# Patient Record
Sex: Female | Born: 1935
Health system: Southern US, Community
[De-identification: ages and names within clinical notes are randomized; demographics above are authoritative.]

## PROBLEM LIST (undated history)

## (undated) DIAGNOSIS — H269 Unspecified cataract: Secondary | ICD-10-CM

## (undated) DIAGNOSIS — T8859XA Other complications of anesthesia, initial encounter: Secondary | ICD-10-CM

## (undated) DIAGNOSIS — F32A Depression, unspecified: Secondary | ICD-10-CM

## (undated) DIAGNOSIS — M810 Age-related osteoporosis without current pathological fracture: Secondary | ICD-10-CM

## (undated) DIAGNOSIS — I10 Essential (primary) hypertension: Secondary | ICD-10-CM

## (undated) DIAGNOSIS — M1990 Unspecified osteoarthritis, unspecified site: Secondary | ICD-10-CM

## (undated) DIAGNOSIS — T4145XA Adverse effect of unspecified anesthetic, initial encounter: Secondary | ICD-10-CM

## (undated) DIAGNOSIS — K56609 Unspecified intestinal obstruction, unspecified as to partial versus complete obstruction: Secondary | ICD-10-CM

## (undated) DIAGNOSIS — F419 Anxiety disorder, unspecified: Secondary | ICD-10-CM

## (undated) DIAGNOSIS — C44219 Basal cell carcinoma of skin of left ear and external auricular canal: Secondary | ICD-10-CM

## (undated) DIAGNOSIS — F329 Major depressive disorder, single episode, unspecified: Secondary | ICD-10-CM

## (undated) HISTORY — DX: Age-related osteoporosis without current pathological fracture: M81.0

## (undated) HISTORY — PX: APPENDECTOMY: SHX54

## (undated) HISTORY — DX: Unspecified cataract: H26.9

## (undated) HISTORY — DX: Unspecified osteoarthritis, unspecified site: M19.90

## (undated) HISTORY — PX: ABDOMINAL HYSTERECTOMY: SHX81

## (undated) HISTORY — DX: Depression, unspecified: F32.A

## (undated) HISTORY — DX: Unspecified intestinal obstruction, unspecified as to partial versus complete obstruction: K56.609

## (undated) HISTORY — PX: COLON SURGERY: SHX602

## (undated) HISTORY — DX: Anxiety disorder, unspecified: F41.9

## (undated) HISTORY — DX: Basal cell carcinoma of skin of left ear and external auricular canal: C44.219

## (undated) HISTORY — DX: Major depressive disorder, single episode, unspecified: F32.9

---

## 1997-12-11 ENCOUNTER — Ambulatory Visit (HOSPITAL_COMMUNITY): Admission: RE | Admit: 1997-12-11 | Discharge: 1997-12-11 | Payer: Self-pay | Admitting: Obstetrics and Gynecology

## 1997-12-11 ENCOUNTER — Encounter: Payer: Self-pay | Admitting: Internal Medicine

## 1999-11-30 ENCOUNTER — Ambulatory Visit (HOSPITAL_COMMUNITY): Admission: RE | Admit: 1999-11-30 | Discharge: 1999-11-30 | Payer: Self-pay | Admitting: *Deleted

## 2000-12-05 ENCOUNTER — Inpatient Hospital Stay (HOSPITAL_COMMUNITY): Admission: EM | Admit: 2000-12-05 | Discharge: 2000-12-06 | Payer: Self-pay | Admitting: Emergency Medicine

## 2000-12-05 ENCOUNTER — Encounter: Payer: Self-pay | Admitting: Emergency Medicine

## 2001-05-19 ENCOUNTER — Inpatient Hospital Stay (HOSPITAL_COMMUNITY): Admission: EM | Admit: 2001-05-19 | Discharge: 2001-05-23 | Payer: Self-pay | Admitting: Emergency Medicine

## 2001-05-19 ENCOUNTER — Encounter: Payer: Self-pay | Admitting: Surgery

## 2001-05-19 ENCOUNTER — Encounter: Payer: Self-pay | Admitting: Emergency Medicine

## 2001-07-03 ENCOUNTER — Encounter: Payer: Self-pay | Admitting: Surgery

## 2001-07-03 ENCOUNTER — Encounter: Admission: RE | Admit: 2001-07-03 | Discharge: 2001-07-03 | Payer: Self-pay | Admitting: Surgery

## 2002-02-28 ENCOUNTER — Encounter (INDEPENDENT_AMBULATORY_CARE_PROVIDER_SITE_OTHER): Payer: Self-pay | Admitting: *Deleted

## 2002-02-28 ENCOUNTER — Ambulatory Visit (HOSPITAL_COMMUNITY): Admission: RE | Admit: 2002-02-28 | Discharge: 2002-02-28 | Payer: Self-pay | Admitting: *Deleted

## 2002-03-24 ENCOUNTER — Encounter (INDEPENDENT_AMBULATORY_CARE_PROVIDER_SITE_OTHER): Payer: Self-pay

## 2002-03-24 ENCOUNTER — Encounter: Payer: Self-pay | Admitting: Emergency Medicine

## 2002-03-24 ENCOUNTER — Encounter: Payer: Self-pay | Admitting: General Surgery

## 2002-03-24 ENCOUNTER — Inpatient Hospital Stay (HOSPITAL_COMMUNITY): Admission: EM | Admit: 2002-03-24 | Discharge: 2002-03-30 | Payer: Self-pay | Admitting: Emergency Medicine

## 2002-11-21 ENCOUNTER — Ambulatory Visit (HOSPITAL_COMMUNITY): Admission: RE | Admit: 2002-11-21 | Discharge: 2002-11-21 | Payer: Self-pay | Admitting: Internal Medicine

## 2004-05-21 ENCOUNTER — Ambulatory Visit (HOSPITAL_COMMUNITY): Admission: RE | Admit: 2004-05-21 | Discharge: 2004-05-21 | Payer: Self-pay | Admitting: Internal Medicine

## 2005-11-16 ENCOUNTER — Ambulatory Visit (HOSPITAL_COMMUNITY): Admission: RE | Admit: 2005-11-16 | Discharge: 2005-11-16 | Payer: Self-pay | Admitting: Internal Medicine

## 2006-06-09 ENCOUNTER — Encounter: Admission: RE | Admit: 2006-06-09 | Discharge: 2006-06-09 | Payer: Self-pay | Admitting: Internal Medicine

## 2006-06-21 ENCOUNTER — Encounter: Admission: RE | Admit: 2006-06-21 | Discharge: 2006-06-21 | Payer: Self-pay | Admitting: Internal Medicine

## 2006-06-23 ENCOUNTER — Encounter: Payer: Self-pay | Admitting: Internal Medicine

## 2006-07-31 ENCOUNTER — Ambulatory Visit (HOSPITAL_COMMUNITY): Admission: RE | Admit: 2006-07-31 | Discharge: 2006-07-31 | Payer: Self-pay | Admitting: Interventional Radiology

## 2006-07-31 ENCOUNTER — Encounter (INDEPENDENT_AMBULATORY_CARE_PROVIDER_SITE_OTHER): Payer: Self-pay | Admitting: Interventional Radiology

## 2006-08-30 ENCOUNTER — Encounter: Payer: Self-pay | Admitting: Interventional Radiology

## 2009-10-06 ENCOUNTER — Encounter: Admission: RE | Admit: 2009-10-06 | Discharge: 2009-10-06 | Payer: Self-pay | Admitting: Internal Medicine

## 2010-02-13 ENCOUNTER — Encounter
Admission: RE | Admit: 2010-02-13 | Discharge: 2010-02-13 | Payer: Self-pay | Source: Home / Self Care | Attending: Internal Medicine | Admitting: Internal Medicine

## 2010-02-14 ENCOUNTER — Encounter: Payer: Self-pay | Admitting: Internal Medicine

## 2010-06-08 NOTE — Consult Note (Signed)
NAMEANTOINE, FIALLOS NO.:  [id number]   MEDICAL RECORD NO.:  [id number]          PATIENT TYPE:  OUT   LOCATION:  XRAY                         FACILITY:  [facility]   PHYSICIAN:  Sanjeev K. Deveshwar, M.D.DATE OF BIRTH:  04-07-1931   DATE OF CONSULTATION:  06/23/2006  DATE OF DISCHARGE:  06/23/2006                                 CONSULTATION   CHIEF COMPLAINT:  Back pain.   HISTORY OF PRESENT ILLNESS:  This is a very pleasant 77 year old female  who was referred to Dr. Corliss Skains through the courtesy of Dr. Ricki Miller.  The  patient developed back pain approximately 3 months ago.  She had an MRI  of the spine on Jun 09, 2006, that showed acute or subacute compression  fractures of the superior end plates of T7 and T11.  There is also a  lesion at T10 that was possibly a benign atypical hemangioma; however,  this could not be certain.  A bone scan was performed on Jun 21, 2006,  that again showed the compression deformities at T7 and T11.  There was  no abnormality noted at T10.  However, Dr. Corliss Skains noted that multiple  myeloma cannot be ruled out.   As noted, the patient has had back pain for approximately 3 months.  She  states her pain is worse while sitting.  This does limit her ability to  participate in her activities of daily living.  She does live alone and  has to do her own housework and self-care.  She presents today to  discuss further treatment options.   PAST MEDICAL HISTORY:  The patient has been very healthy.  She does have  a long history of tobacco use and probable COPD, at least by chest x-ray  on May 25, 2006.  She also has a history of some anxiety.  Otherwise, she  has been very healthy.  She denies any history of diabetes, coronary  artery disease, hypertension or CVAs.   SURGICAL HISTORY:  The patient had a hysterectomy in the past.  She also  had an exploratory laparotomy with a small-bowel resection in February  2004.  She is also status post  appendectomy.  She denies any problems  with anesthesia.   ALLERGIES:  No known drug allergies, although she did not tolerate  FOSAMAX in the past.  She denies allergies to contrast dye, shrimp,  iodine, shellfish or latex.   CURRENT MEDICATIONS:  Include Tylenol p.r.n., Effexor, calcium and  vitamin D.   SOCIAL HISTORY:  The patient is widowed.  She has two children.  She  lives alone in [place].  She quit smoking in 1995.  She did smoke a  pack-and-a-half per day for at least 40 years.  She has a glass of wine  daily.  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  She still  works part-time.   FAMILY HISTORY:  Her father died at age 86 in his sleep.  They felt it  was probably stroke or an MI.   IMPRESSION AND PLAN:  As noted, this pleasant 77 year old female with  osteoporosis has been referred  to Dr. Corliss Skains through the courtesy of  Dr. Ricki Miller for further evaluation of compression fractures and a history  of back pain x3 months.  There is no history of trauma.  Dr. Corliss Skains  reviewed the results of the patient's MRI as well as her bone scan with  the patient.  He pointed out the areas of concern.  He showed the  patient the compression fractures which were felt to be the source of  her back pain.  Treatment options were discussed including continued  limited mobility and pain medication versus kyphoplasty or  vertebroplasty.  The kyphoplasty or vertebroplasty  procedures were described in detail along with the risks and benefits.  The patient would like to proceed with the intervention for relief of  her pain.  We will call her next week to set a date for the  intervention.   Greater than 30 minutes was spent on this consult.      Delton See, P.A.    ______________________________  Grandville Silos. Corliss Skains, M.D.    DR/MEDQ  D:  06/26/2006  T:  06/26/2006  Job:  [id number]   cc:   Juline Patch, M.D.

## 2010-06-11 NOTE — Cardiovascular Report (Signed)
[place]  Patient:    Catherine Young, Catherine Young Visit Number: [id number] MRN: [id number]          Service Type: MED Location: 3W [phone] Attending Physician:  Noland Fordyce Dictated by:   Julieanne Manson, M.D. Proc. Date: 12/06/00 Admit Date:  12/05/2000   CC:         Lilia Pro, M.D.  [facility]   Cardiac Catheterization  INDICATIONS FOR TEST:  [name] is a 77 year old female who was admitted to [facility] on December 05, 2000 with anginal-type pain.  Her cardiac enzymes were negative and her CKs were unremarkable.  She has vascular bruits on exam.  She is brought to [facility] to [facility] for cardiac catheterization.  DESCRIPTION OF PROCEDURE:  Patient was prepped and draped in the usual sterile fashion, exposing the right groin.  Applying local anesthetic with 1% Xylocaine, the Seldinger technique was employed and a 5-French introducer sheath was placed in the right femoral artery.  Selective left and right coronary arteriography and ventriculography were performed.  EQUIPMENT:  5-French Judkins configuration catheters, however, a 5-French internal mammary artery catheter was used to cannulate the right coronary artery.  COMPLICATIONS:  None.  RESULTS: 1. Hemodynamic monitoring:  Central aortic pressure was 133/54.  Left    ventricular pressure was 138/15 with no significant aortic valve gradient    noted at the time of pullback. 2. Ventriculography:  Ventriculography in the RAO projection revealed normal    left ventricular systolic function.  The end-diastolic pressure was 18.    The ejection fraction was 54%. 3. Coronary arteriography:  On fluoroscopy, there was dense calcification in    the proximal LAD and right coronary artery.    a. Left main:  Normal.    b. LAD:  The LAD went down to the apex of the heart and had mild, less than       50% areas of narrowing in the proximal and mid-segment.  The  diagonal       vessel was small.    c. Circumflex:  Circumflex gave rise to two OMs; first OM was large and       free of disease; OM #2 was small and free of disease.    d. Right coronary artery:  The right coronary artery had calcification in       its mid and proximal portion.  There were less than 30% areas of       irregularities in the mid and proximal section.  The distal vessel was       free of significant disease.  CONCLUSION: 1. Basically normal left ventricular systolic function with 54% ejection    fraction. 2. Dense calcification in the coronary arteries with no evidence of    significant arterial occlusion.  At this point, the patient will be discharged later today with followup in my office tomorrow.  Lipids are pending at this time and she will need outpatient carotid Dopplers because of carotid bruits.  Long-term followup with Dr. Lilia Pro. Dictated by:   Julieanne Manson, M.D. Attending Physician:  Noland Fordyce DD:  12/06/00 TD:  12/06/00 Job: [id number]

## 2010-06-11 NOTE — Discharge Summary (Signed)
NAMELUCRESHA, Catherine Young NO.:  [id number]   MEDICAL RECORD NO.:  [id number]                   PATIENT TYPE:  INP   LOCATION:  0450                                 FACILITY:  [facility]   PHYSICIAN:  Anselm Pancoast. Zachery Dakins, M.D.          DATE OF BIRTH:  May 22, 1931   DATE OF ADMISSION:  03/24/2002  DATE OF DISCHARGE:  03/30/2002                                 DISCHARGE SUMMARY   DISCHARGE DIAGNOSIS:  Small-bowel obstruction secondary to adhesions with  ischemic segment of ileum.   OPERATION:  Exploratory laparotomy and resection of small-bowel ischemic  segment with functional [place] anastomosis.   HISTORY OF PRESENT ILLNESS:  The patient is a 77 year old Caucasian female  who presented to the emergency room at [facility] approximately midnight on  March 23, 2002, and stated that she had had nausea and vomiting for  approximately four days.  Her chronic medications included Xanax and  trazodone, and she is followed by Dr. Ricki Miller at [facility].  She was  seen by Dr. Estell Harpin.  He checked laboratory studies, and her white count was  not elevated at 7000, hemoglobin was 12.7.  CMET was normal.  Glucose was  mildly elevated at 177, BUN of 8.  There was a lot of contrast in the very  dilated proximal small bowel, and it appeared that it may be a little  decompressed distally.  Her past history included that in May 2003 she had an exploratory laparotomy  by Dr. Jamey Ripa and was found to have a single band in the left upper  quadrant.  We started her NG suction.   HOSPITAL COURSE:  She was admitted.  On physical exam later that day her  abdominal exam had changed in that she was now tender in the lower right  abdomen where previously she had not had any localized tenderness.  With the  change on abdominal findings, noting that she was not febrile or toxic, I  recommended that we proceed on with exploratory laparotomy, and she was  taken to surgery and this  time found to have basically a small loop of  terminal ileum that was caught within an adhesion from where she had had a  previous appendectomy and a hysterectomy.  This was from the tube to the  cecum area, and it has obviously been a chronic problem that is now giving  problems as her appendectomy was about 25 years and her hysterectomy had  been about 15.  We resected the area and did a functional [place]  anastomosis, and I continued her on Unasyn for four days postoperatively.  She has a chronic problem with constipation and very sluggish bowels, and we  removed the NG tube on the second postoperative day, and then started her on  a liquid diet the following day.  Her abdomen remained flat.  She had good  bowel sounds and started passing a small amount of flatus and desired  that  her chronic medications for the depression, etc., be restarted.  She had her  diet advanced on full liquids on March 29, 2002, and was ready for discharge  without  abdominal problems on March 30, 2002.  She will see me back in the office in  approximately a week.  She has Vicodin for incisional pain and continues  with her chronic medications.  Her incisions are healing satisfactorily from  the time of surgery and, hopefully, she will have no further problems with  intestinal obstruction.                                               Anselm Pancoast. Zachery Dakins, M.D.    WJW/MEDQ  D:  04/16/2002  T:  04/16/2002  Job:  [id number]   cc:   Soyla Murphy. Renne Crigler, M.D.  [address]  [place]  [place] [address]  Fax: [phone]

## 2010-06-11 NOTE — Op Note (Signed)
[facility]  Patient:    MONICE, LUNDY Visit Number: [id number] MRN: [id number]          Service Type: SUR Location: 4W 0469 01 Attending Physician:  Charlton Haws Dictated by:   Currie Paris, M.D. Proc. Date: 05/19/01 Admit Date:  05/19/2001   CC:         Lilia Pro, M.D.   Operative Report  VISIT #[id number].  PREOPERATIVE DIAGNOSIS:  Acute small bowel obstruction probably secondary to adhesions.  POSTOPERATIVE DIAGNOSIS:  Acute small bowel obstruction probably secondary to adhesions.  OPERATION: 1. Exploratory exploratory. 2. Lysis of adhesions.  SURGEON:  Currie Paris, M.D.  ASSISTANT:  Sharlet Salina T. Hoxworth, M.D.  ANESTHESIA:  General endotracheal.  CLINICAL HISTORY:  This patient is a 77 year old who presented with a two-day history of severe abdominal pain primarily left-sided. A CT scan was consistent with a small bowel obstruction. After discussion with the patient and her husband, we elected to proceed to laparotomy today.  DESCRIPTION OF PROCEDURE:  The patient was brought to the operating room and after satisfactory general endotracheal anesthesia had been obtained, Foley catheter was placed and the abdomen prepped and draped. Midline incision was made starting just above the umbilicus and entering going below the umbilicus and about a third of the way down. Upon entering, we saw some empty small bowel and I could see in the left upper quadrant, a markedly red loop of bowel. Upon reaching up for that, I felt a slight "give" and then the loop came up. This was a 6 to 8-cm section that appeared to have been caught under a band which I think broke up as I was manipulating this out. This was clearly the site of what I thought was a representative closed loop obstruction. The bowel, however, was completely viable.  We ran the small bowel proximally. There was no other problems as well as distally.  There was a large amount of old residual stool in the colon and the patient had noted preoperatively of chronic constipation.  Everything appeared to be dry. The NG was checked for positioning and it was okay. The abdomen was closed with #1 PDS. The wound was irrigated with saline and the skin closed with staples. The patient tolerated the procedure well. There were no operative complications. All counts were correct. Dictated by:   Currie Paris, M.D. Attending Physician:  Charlton Haws DD:  05/19/01 TD:  05/19/01 Job: [id number]

## 2010-06-11 NOTE — Discharge Summary (Signed)
[facility]  Patient:    Catherine Young, Catherine Young Visit Number: [id number] MRN: [id number]          Service Type: SUR Location: 4W 0469 01 Attending Physician:  Charlton Haws Dictated by:   Currie Paris, M.D. Admit Date:  05/19/2001 Discharge Date: 05/23/2001                             Discharge Summary  FINAL DIAGNOSES: 1. Small bowel obstruction secondary to adhesions. 2. History of depression.  CLINICAL HISTORY:  Ms. Werk is a 77 year old lady admitted via the emergency room on April 26 with an acute small bowel obstruction.  I had some concerns for a closed loop because of the severe pain that she was having and we elected to take her to the operating room.  HOSPITAL COURSE:  The patient was admitted and taken to the operating room, where a single closed loop was released.  The bowel was viable.  Postoperatively, she had a relatively benign course.  Her NG came out the day following surgery and home medications were restarted.  She had the ability to ambulate right after surgery, was able to keep her lungs clear, and clear liquids were started on the day following surgery on April 28.  We also gave her some Fleets enemas because of her history of chronic constipation and her colon noted to be quite filled with stool at the time of surgery.  By April 30, she was feeling okay.  She was tolerating a diet.  We advanced a little bit.  She had a small bowel movement spontaneously and was continuing to pass gas.  Her diet was increased that day and after lunch she tolerated it, felt her pain was well controlled on oral medications, and that she could go home.  She was discharged on April 30 in satisfactory condition, Vicodin for pain, a regular diet.  She was to resume her usual activities and to follow up with me in my office in approximately five days for staple removal.  LABORATORY STUDIES:  Admission hemoglobin of 13 with a  followup of 12.5. Electrolytes were unremarkable.  Urinalysis was clear.  A urine culture was pending.  EKG showed a sinus bradycardia but otherwise negative. Dictated by:   Currie Paris, M.D. Attending Physician:  Charlton Haws DD:  05/28/01 TD:  05/30/01 Job: [id number]

## 2010-06-11 NOTE — Op Note (Signed)
   NAME:  MARIKAY, ROADS NO.:  [id number]   MEDICAL RECORD NO.:  [id number]                   PATIENT TYPE:  AMB   LOCATION:  ENDO                                 FACILITY:  [facility]   PHYSICIAN:  Georgiana Spinner, M.D.                 DATE OF BIRTH:  [birth year]   DATE OF PROCEDURE:  02/28/2002  DATE OF DISCHARGE:                                 OPERATIVE REPORT   PROCEDURE PERFORMED:  Upper endoscopy with biopsy.   ENDOSCOPIST:  Georgiana Spinner, M.D.   INDICATIONS FOR PROCEDURE:  Abdominal pain.   ANESTHESIA:  Demerol 50 mg, Versed 5 mg.   DESCRIPTION OF PROCEDURE:  With the patient mildly sedated in the left  lateral decubitus position, the Olympus video endoscope was inserted in the  mouth and passed under direct vision through the esophagus which appeared  normal.  The endoscope was advanced into the stomach  the fundus, body,  antrum, duodenal bulb and second portion of the duodenum were visualized.  From this point, the endoscope was slowly withdrawn taking circumferential  views of the entire duodenal mucosa until the endoscope was pulled back into  the stomach and placed on retroflexion to view the stomach from below.  A  loose wrap of the gastroesophageal junction was seen and photographed.  The  endoscope was then straightened and withdrawn taking circumferential views  of the remaining gastric and esophageal mucosa stopping in the stomach to  biopsy changes of erythema which might be consistent with gastritis.   PLAN:  Await biopsy report.  The patient will call me for results and follow  up with me as an outpatient.  Proceed to colonoscopy as planned.                                               Georgiana Spinner, M.D.    GMO/MEDQ  D:  02/28/2002  T:  02/28/2002  Job:  [id number]

## 2010-06-11 NOTE — Discharge Summary (Signed)
[facility]  Patient:    Catherine Young, Catherine Young Visit Number: [id number] MRN: [id number]          Service Type: CAT Location: CATH Attending Physician:  Loreli Dollar Dictated by:   Lilia Pro, M.D. Adm. Date:  12/05/00 Disc. Date: 12/06/00                             Discharge Summary  DISCHARGE DIAGNOSIS:  Chest pain.  CONSULTATIONS:  Dr. Caprice Kluver.  PROCEDURES:  Left cardiac catheterization, which showed dense calcium deposits in the LAD and RCA with mild, less than 50%, irregularities in the LAD, but no significant coronary artery disease.  HISTORY OF PRESENT ILLNESS:  This is a 77 year old white female under my primary care who presented with a history of pressure and tightness in her chest in the substernal area.  It woke her up in the morning.  It was relieved by aspirin.  She denied shortness of breath, nausea, or diaphoresis.  PAST MEDICAL HISTORY: 1. Osteopenia. 2. Depression. 3. 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 4. Right foot fracture. 5. Mortons neuroma.  PAST SURGICAL HISTORY: 1. Appendectomy. 2. Hysterectomy.  PHYSICAL EXAMINATION:  GENERAL:  On admission her chest pain was down, although she was still having it.  VITAL SIGNS:  Blood pressure of 164/71, pulse 54, temperature 97.7.  HEART:  Regular rate and rhythm.  There were no murmurs.  The rest of her exam was benign.  LABORATORY DATA:  Normal white count and hemoglobin.  Normal electrolytes. Normal cardiac enzymes.  Amylase and lipase were within normal limits.  HOSPITAL COURSE:  Because of her history, we obtained a CT scan of her chest to rule out a dissection and consulted cardiology.  Dr. Clarene Duke saw the patient.  He agreed with the CT of the chest, which was negative, and felt that she needed to be started on heparin and have a cardiac catheterization. On December 06, 2000, she underwent a cardiac catheterization with the results as noted above.  Because this was  basically a clean catheterization, Dr. Clarene Duke felt she could be discharged.  She was discharged on December 06, 2000, in stable condition.  DISCHARGE DIAGNOSIS:  Chest pain, noncardiac in origin.  PLAN:  She was started on Protonix and will be followed up in my office.  It should also be noted that she had cholesterol levels checked.  Her total cholesterol was 182, with an LDL of 106.  Cardiac enzymes were negative.Dictated by:   Lilia Pro, M.D. Attending Physician:  Loreli Dollar DD:  01/10/01 TD:  01/10/01 Job: [id number]

## 2010-06-11 NOTE — Op Note (Signed)
[name], [name] NO.:  [id number]   MEDICAL RECORD NO.:  [id number]                   PATIENT TYPE:  INP   LOCATION:  0450                                 FACILITY:  [facility]   PHYSICIAN:  Anselm Pancoast. Zachery Dakins, M.D.          DATE OF BIRTH:  1931-06-21   DATE OF PROCEDURE:  03/24/2002  DATE OF DISCHARGE:                                 OPERATIVE REPORT   PREOPERATIVE DIAGNOSES:  Intestinal obstruction, tenderness right lateral  abdomen.   POSTOPERATIVE DIAGNOSIS:  Intestinal obstruction secondary to an internal  hernia with ischemic section of ileum.   OPERATION:  1. Exploratory laparotomy.  2. Lysis of adhesions.  3. Small bowel resection with end-to-end anastomosis.   ANESTHESIA:  General.   SURGEON:  Anselm Pancoast. Zachery Dakins, M.D.   ASSISTANT:  Nurse.   HISTORY:  Catherine Young is a 77 year old female, who was admitted through  the emergency room this morning.  She arrived there in the early a.m. with  nausea and vomiting of several hours duration.  The patient was seen by the  ER physicians.  They had checked laboratory studies.  She was not febrile.  White count was 7000.  She had had plain abdominal films, and they proceeded  with a CT that showed a probable small bowel obstruction, and we could see  contrast in the proximal small bowel which was a lot of fluid-filled loops  but could not see any definite contrast going through the small bowel into  the colon.  There was some stool in the colon and 10 months ago, the patient  had an episode of similar pain, and Catherine Young, M.D. had operated  on her and found what appeared to be an adhesion from a loop of congested  bowel in the left upper quadrant.  On physical exam, I did not appreciate  any localized tenderness.  She was bloated, was complaining of cramping  sensations, and I placed an NG tube.  She had been started on IV fluids, and  she was admitted to the floor.  I  returned to see her probably 8-9 hours  later and on physical exam, she was not having any fever, but the abdominal  pain now was definitely localized to the right of the umbilicus, not really  in the right lower quadrant, but it was definitely localized, and repeat x-  ray had been obtained which was basically kind of unchanged.  This was just  a plain abdominal film, but there was no contrast in the colon, and it  looked as if there was not much contrast at all in the right lower quadrant  where she was actually complaining of the pain.  I recommended that we  proceed with exploratory laparotomy, and the patient and her husband were in  agreement.   PAST MEDICAL HISTORY:  She has had a hysterectomy, and she has also had an  appendectomy.  The appendectomy was  about age 70, and the hysterectomy was  approximately 25 years ago.   DESCRIPTION OF PROCEDURE:  I gave her 3 g of Unasyn.  She has PAS stockings  and took her to the operating suite.  Induction of general anesthesia.  She  already had an NG tube, and a Foley catheter was inserted sterilely, and  then the abdomen was prepped with Betadine surgical scrub and solution after  she was asleep where she was complaining of being tender on exam, and there  was definitely a fullness there when her muscles were relaxed.  The midline  incision, most of it was opened at the one that Dr. Jamey Young had used and upon  entering into the peritoneal cavity, there was not any fluid, but you could  see an area that looked like there was an internal hernia, and I could not  decide whether this was a rectus hernia or exactly what.  It was not truly  down in the groin, and I opened the thin layer of peritoneum, and it was  noted that this was an infusion from where she had had her hysterectomy and  her appendectomy years earlier, that a loop of small bowel had kind of snuck  in really kind of being incorporated within the peritoneum that was really  lining  the cecum.  It was not really a hernia of the true abdominal wall,  but it was all intraperitoneally, and I divided this adhesion, and then this  incarcerated segment of ileum about 8 inches in length, could be reduced up  into the abdominal incision.  The bowel itself was very hemorrhagic, not  frankly dead but damaged enough that I thought it would be safe to go ahead  and remove it.  I divided the mesentery between [facility], and these were  ligated with 2-0 Vicryl and did a functional end-to-end stapled anastomosis  with the GIA open, inspected the suture line and then transecting the area  with the TA 60 and then removing this damaged segment of ileum.  The little  mesenteric defect was closed with 3-0 silk, and a 3-0 silk was placed in the  crotch area.  Next, and I was working from the left side, carefully  inspected the area.  There were not any other adhesions.  You could take it  right on up to the ligament of Treitz.  Of course the bowel was very  distended, and I then sort of opened up a portion of this little  pseudocavity and then closed a portion of it, so that hopefully this will  not reoccur.  Of course there is no longer any dense adhesion kind of going  to what used to be the round ligament.  I think that was the actual little  focus where this had originally started.  I thoroughly irrigated the lower  abdomen, placed the bowel back I think in anatomical position and then  brought the omentum over it and then closed the midline fascia with  interrupted sutures of 0 Prolene.  The NG tube was in good position, and we  will continue with the suction and also start encouraging deep breathing,  coughing to hopefully allow the gas to go ahead into the colon fairly  quickly.  The patient will be continued on antibiotics for about four doses  postoperatively, and I am going to leave the Foley catheter in probably  about 36 hours.  Sponge and needle counts were correct.  The estimated  blood  loss was minimal.  The patient was sent to recovery room, extubated,  breathing spontaneously.                                               Anselm Pancoast. Zachery Dakins, M.D.    WJW/MEDQ  D:  03/24/2002  T:  03/24/2002  Job:  [id number]   cc:   Juline Patch, M.D.  [address]  [place], [place] [address]  Fax: [phone]

## 2010-06-11 NOTE — H&P (Signed)
[name], [name] NO.:  [id number]   MEDICAL RECORD NO.:  [id number]                   PATIENT TYPE:  INP   LOCATION:  0450                                 FACILITY:  [facility]   PHYSICIAN:  Anselm Pancoast. Zachery Dakins, M.D.          DATE OF BIRTH:  12/11/1931   DATE OF ADMISSION:  03/24/2002  DATE OF DISCHARGE:                                HISTORY & PHYSICAL   CHIEF COMPLAINT:  Nausea and abdominal pain.   HISTORY OF PRESENT ILLNESS:  Catherine Young is a 77 year old Caucasian  female who presented to the emergency room at [facility]  at approximately midnight on March 23, 2002 and stated that she had had  nausea and vomiting for about four days. Her chronic mediations were Xanax  and Trazodone. She is followed by Dr. Suzie Portela at [facility]. She was  seen in the emergency room by Dr. Estell Harpin. He checked lab studies and white  count was not elevated at 7,000. Hemoglobin was 12.7 with hematocrit of 38.  SMA and C-met were all normal. Glucose was slightly elevated at 177 with a  BUN of 8. Amylase and lipase were normal. He then, after plain abdominal  films were taken, obtained a CT scan and this showed what looked like a  small bowel obstruction. There was a lot of contrast and very dilated small  bowel in the proximal small bowel but there appeared to be maybe a little  bit of decompressed small bowel. Her past medical history revealed that ten  months ago in May of 2003, she had an exploratory laparotomy for a bowel  obstruction but it appears that she had kind of a single band at that time  and a hope that we could treat her conservatively without the need of a  repeat exploration. I did placed a NG tube in the emergency room and  admitted her.   PAST SURGICAL HISTORY:  She had an appendectomy in her early 20's. She had a  hysterectomy about 25 years ago. She had a laparotomy by Dr. Jamey Ripa about  ten months ago. She is not  bothered with high blood pressure. She is on  Xanax and Trazodone for anxiety.   PHYSICAL EXAMINATION:  VITAL SIGNS: In the emergency room about 8:00 p.m.  revealed blood pressure of 100/55. She had received some medication and just  returned from CT scan. Her temperature was 97. Pulse was reported to be  about 50 but it had been 96 previously and her respiratory rate was 20. O2  sat is 87%.  HEENT: She appears adequately hydrated. There is no cervical or  supraclavicular lymphadenopathy.  LUNGS: She has good breath sounds.  CARDIAC: Normal sinus rhythm.  ABDOMEN: Few bowel sounds. She is not locally tender in any quadrant of her  abdomen. She is somewhat bloated and very little gas and a small amount of  stool in the rectum.  GU: I did  not do a pelvic examination on her. She has had a hysterectomy.  EXTREMITIES: No pedal edema. Appears adequately hydrated.  NEURO: CNS is physiologic.   ADMISSION IMPRESSION:  Recurrent small bowel obstruction, probably  adhesions. We  will try to treat her with NG suction, IV fluids, mild pain  medications, and re-examine her later today for localized findings.                                                Anselm Pancoast. Zachery Dakins, M.D.    WJW/MEDQ  D:  03/24/2002  T:  03/24/2002  Job:  [id number]

## 2010-06-11 NOTE — H&P (Signed)
[facility]  Patient:    Catherine Young, Catherine Young Visit Number: [id number] MRN: [id number]          Service Type: SUR Location: 4W 0469 01 Attending Physician:  Charlton Haws Dictated by:   Currie Paris, M.D. Admit Date:  05/19/2001                           History and Physical  VISIT #[id number]  CHIEF COMPLAINT:  Abdominal pain.  HISTORY OF PRESENT ILLNESS:  The patient presents to the emergency room with a 48-hour history of abdominal pain which she says was the worst abdominal pain she has ever had, mainly left-sided.  It started on Thursday, and today is Saturday.  It has been associated with a lot of nausea and persistent vomiting.  She notes that she has had problems with constipation ever since she started taking calcium but has not had a bowel movement since the pain started and has not passed any gas for 24-36 hours.  The pain has been persistent, and she only got some relief when she got some pain medication in the emergency room, but she is still having intermittent cramping, fairly significant abdominal pain.  It seems to be primarily left-sided, although it rolls around to the lower abdomen and lateral left side.  The patient has never had any pain like this before, and really has very minimal, if any, prior GI symptoms.  The patient has otherwise been in good health.  MEDICATIONS:  Wellbutrin and Xanax as well as calcium supplementations.  ALLERGIES:  None known.  TOBACCO/ALCOHOL:  Smokes, none (quit in 1992).  Alcohol, drinks occasional wine.  REVIEW OF SYSTEMS:  HEENT:  Negative.  CHEST:  No cough or shortness of breath.  HEART:  No history of murmurs or hypertension.  No coronary artery disease or symptoms to suggest coronary artery disease.  ABDOMEN:  Negative except for HPI.  GENITOURINARY:  Negative.  EXTREMITIES:  Negative.  PHYSICAL EXAMINATION:  GENERAL:  Healthy-appearing 77 year old.  She is  uncomfortable.  She is alert, awake, and oriented.  HEENT:  Normocephalic.  Eyes nonicteric.  Pupils are round and regular.  NECK:  Supple.  No masses or thyromegaly.  LUNGS:  Sound clear to auscultation.  HEART:  Regular.  No murmurs, rubs, or gallops.  ABDOMEN:  Not particularly distended but minimally so.  It is relatively soft, but she is tender across the left side.  There are no masses palpable.  She has some hypoactive bowel sounds.  EXTREMITIES:  No cyanosis or edema.  She has pulses.  LABORATORY DATA:  Basically normal.  She did have a little bit of blood in her urine, so a CT was obtained.  I have reviewed the CT with the radiologist, and she has what appears to be a small-bowel obstruction with markedly dilated proximal jejunum going to an area in the left mid abdomen, and all the distal bowel is markedly empty and decompressed, and the TI is visible as it is completely decompressed with narrowing to the cecum.  There is still some residual stool in the colon as well as air residual in the colon.  IMPRESSION:  Acute small-bowel obstruction, probably secondary to adhesions.  PLAN:  Discussed alternatives with the patient, which are NG tube suction, decompression, and follow-up versus laparotomy.  I told her I favor laparotomy in her situation given the significant amount of pain that she has had and the fairly clear-cut  findings on CT scan.  She understands the surgery and the indications and is willing to proceed.  We will try to get this scheduled today.Dictated by:   Currie Paris, M.D. Attending Physician:  Charlton Haws DD:  05/19/01 TD:  05/20/01 Job: [phone] [id number]

## 2010-06-11 NOTE — Op Note (Signed)
   NAME:  Catherine Young, Catherine Young NO.:  [id number]   MEDICAL RECORD NO.:  [id number]                   PATIENT TYPE:  AMB   LOCATION:  ENDO                                 FACILITY:  [facility]   PHYSICIAN:  Georgiana Spinner, M.D.                 DATE OF BIRTH:  November 13, 1931   DATE OF PROCEDURE:  02/28/2002  DATE OF DISCHARGE:                                 OPERATIVE REPORT   PROCEDURE PERFORMED:  Colonoscopy.   ENDOSCOPIST:  Georgiana Spinner, M.D.   INDICATIONS FOR PROCEDURE:  Colon cancer screening.   ANESTHESIA:  Demerol 50 mg, Versed 5 mg.   DESCRIPTION OF PROCEDURE:  With the patient mildly sedated in the left  lateral decubitus position, subsequently rolled to her back and various  position changes, the Olympus videoscopic pediatric colonoscope was inserted  in the rectum and passed under direct vision through a very tortuous colon  with pressure applied and we finally reached the cecum, identified by the  ileocecal valve and appendiceal orifice, the former of which were  photographed.  After we viewed the cecum, the colonoscope was slowly  withdrawn taking circumferential views of the entire colonic mucosa stopping  to photograph diverticula see along the way in the left colon until we  reached the rectum which appeared normal on direct and retroflex view.  The  endoscope was straightened and withdrawn.  The patient's vital signs and  pulse oximeter remained stable.  The patient tolerated the procedure well  without apparent complications.   FINDINGS:  Prolonged examination due to tortuosity but we finally reached  the cecum.  Diverticulosis of the sigmoid colon was found.  Have patient  follow up with me for results of endoscopy.                                                     Georgiana Spinner, M.D.    GMO/MEDQ  D:  02/28/2002  T:  02/28/2002  Job:  [id number]

## 2010-06-25 LAB — HM COLONOSCOPY

## 2010-11-09 LAB — CBC
HCT: 40.2
MCHC: 33.5
MCV: 96.4
Platelets: 250
WBC: 4.1

## 2010-11-09 LAB — BASIC METABOLIC PANEL
BUN: 13
CO2: 31
Chloride: 104
Creatinine, Ser: 0.84
Glucose, Bld: 84
Potassium: 4

## 2010-11-09 LAB — PROTIME-INR: Prothrombin Time: 13.4

## 2011-11-15 ENCOUNTER — Other Ambulatory Visit: Payer: Self-pay | Admitting: Internal Medicine

## 2011-11-15 DIAGNOSIS — Z1231 Encounter for screening mammogram for malignant neoplasm of breast: Secondary | ICD-10-CM

## 2011-11-17 ENCOUNTER — Ambulatory Visit (INDEPENDENT_AMBULATORY_CARE_PROVIDER_SITE_OTHER): Payer: Medicare Other

## 2011-11-17 DIAGNOSIS — Z1231 Encounter for screening mammogram for malignant neoplasm of breast: Secondary | ICD-10-CM

## 2012-09-17 ENCOUNTER — Encounter: Payer: Self-pay | Admitting: Internal Medicine

## 2012-10-16 ENCOUNTER — Ambulatory Visit: Payer: Medicare Other | Admitting: Internal Medicine

## 2012-11-06 ENCOUNTER — Ambulatory Visit: Payer: Self-pay | Admitting: Gastroenterology

## 2013-06-27 LAB — CBC AND DIFFERENTIAL
HEMATOCRIT: 42 % (ref 36–46)
HEMOGLOBIN: 13.4 g/dL (ref 12.0–16.0)

## 2014-09-18 LAB — HM DEXA SCAN

## 2015-01-13 ENCOUNTER — Encounter: Payer: Self-pay | Admitting: Internal Medicine

## 2015-01-13 ENCOUNTER — Ambulatory Visit (INDEPENDENT_AMBULATORY_CARE_PROVIDER_SITE_OTHER): Payer: Medicare Other | Admitting: Internal Medicine

## 2015-01-13 VITALS — BP 120/74 | HR 58 | Temp 96.8°F | Ht 65.75 in | Wt 121.0 lb

## 2015-01-13 DIAGNOSIS — F418 Other specified anxiety disorders: Secondary | ICD-10-CM

## 2015-01-13 DIAGNOSIS — R300 Dysuria: Secondary | ICD-10-CM | POA: Diagnosis not present

## 2015-01-13 DIAGNOSIS — N390 Urinary tract infection, site not specified: Secondary | ICD-10-CM | POA: Diagnosis not present

## 2015-01-13 DIAGNOSIS — F419 Anxiety disorder, unspecified: Secondary | ICD-10-CM

## 2015-01-13 DIAGNOSIS — M810 Age-related osteoporosis without current pathological fracture: Secondary | ICD-10-CM

## 2015-01-13 DIAGNOSIS — F329 Major depressive disorder, single episode, unspecified: Secondary | ICD-10-CM

## 2015-01-13 DIAGNOSIS — M1990 Unspecified osteoarthritis, unspecified site: Secondary | ICD-10-CM | POA: Insufficient documentation

## 2015-01-13 DIAGNOSIS — Z8719 Personal history of other diseases of the digestive system: Secondary | ICD-10-CM | POA: Diagnosis not present

## 2015-01-13 DIAGNOSIS — F411 Generalized anxiety disorder: Secondary | ICD-10-CM | POA: Insufficient documentation

## 2015-01-13 LAB — POCT URINALYSIS DIPSTICK
BILIRUBIN UA: NEGATIVE
GLUCOSE UA: NEGATIVE
KETONES UA: NEGATIVE
NITRITE UA: POSITIVE
PH UA: 6
Spec Grav, UA: 1.02
UROBILINOGEN UA: NEGATIVE

## 2015-01-13 MED ORDER — CIPROFLOXACIN HCL 250 MG PO TABS: 250.0000 mg | ORAL_TABLET | Freq: Two times a day (BID) | ORAL | Status: DC

## 2015-01-13 NOTE — Patient Instructions (Signed)

## 2015-01-13 NOTE — Assessment & Plan Note (Signed)
Continue Aleve prn

## 2015-01-13 NOTE — Progress Notes (Signed)
Pre visit review using our clinic review tool, if applicable. No additional management support is needed unless otherwise documented below in the visit note. 

## 2015-01-13 NOTE — Progress Notes (Signed)
HPI  Pt presents to the clinic today to establish care and for management of the conditions listed below. She is transferring care from Dr. Minna Antis at [facility].  Arthritis: Mainly in her hands and knees. She takes Aleve with good relief.  Anxiety and Depression: She reports this is a chronic issue for her. She reorts she recently moved to [facility] and does not like living there. She is taking Trazadone (has been for the last 20 years). She take Clonazepam BID. She takes the Ambien, once every 2 weeks, but she reports her insurance will no longer cover it.  Osteoporosis: She is not medicated for this. She did bring records of her last bone density exam for me to review.  History of intestinal obstruction: x 2, s/p surgery. She reports she is having irregular BM's. She usually goes 3 x week. She has not noticed any blood in her stool. She has not tried anything OTC.  She also c/o dysuria. This started 10 days ago. She did not go to the doctor because she did not want to drive all the way to [place]. She denies fever, chills or body aches. She has not tried anything OTC.   Flu: 10/2014 Tetanus: unsure Pneumovax: within the last 5 years Prevnar: never Zostovax: never Mammogram: < 5 years ago Pap Smear: unsure Colon Screening: unsure Vision Screening: yearly at [facility] Dentist: as needed  Past Medical History  Diagnosis Date  . Arthritis   . Depression   . Intestinal obstruction [facility])     Current Outpatient Prescriptions  Medication Sig Dispense Refill  . aspirin 81 MG tablet Take 81 mg by mouth daily.    . Cholecalciferol (VITAMIN D3) 1000 UNITS CAPS Take 1 capsule by mouth daily.    . clonazePAM (KLONOPIN) 1 MG tablet Take 1 mg by mouth 2 (two) times daily.  0  . traZODone (DESYREL) 100 MG tablet Take 100 mg by mouth at bedtime.    Marland Kitchen zolpidem (AMBIEN) 10 MG tablet Take 10 mg by mouth at bedtime as needed for sleep.     No current facility-administered medications for this  visit.    No Known Allergies  No family history on file.  Social History   Social History  . Marital Status: Widowed    Spouse Name: N/A  . Number of Children: N/A  . Years of Education: N/A   Occupational History  . Not on file.   Social History Main Topics  . Smoking status: Not on file  . Smokeless tobacco: Not on file  . Alcohol Use: Not on file  . Drug Use: Not on file  . Sexual Activity: Not on file   Other Topics Concern  . Not on file   Social History Narrative  . No narrative on file    ROS:  Constitutional: Pt reports fatigue. Denies fever, malaise, headache or abrupt weight changes.  Respiratory: Denies difficulty breathing, shortness of breath, cough or sputum production.   Cardiovascular: Denies chest pain, chest tightness, palpitations or swelling in the hands or feet.  Gastrointestinal: t reports constipation. Denies abdominal pain, bloating, diarrhea or blood in the stool.  GU: Pt reports dysuria. Denies frequency, urgency, blood in urine, odor or discharge. Musculoskeletal: Pt reports joint pain. Denies decrease in range of motion, difficulty with gait, muscle pain or joint swelling.  Skin: Denies redness, rashes, lesions or ulcercations.  Neurological: Denies dizziness, difficulty with memory, difficulty with speech or problems with balance and coordination.  Psych: Pt reports  chronic anxiety and depression. Denies SI/HI.  No other specific complaints in a complete review of systems (except as listed in HPI above).  PE:  BP 120/74 mmHg  Pulse 58  Temp(Src) 96.8 F (36 C) (Oral)  Ht 5' 5.75" (1.67 m)  Wt 121 lb (54.885 kg)  BMI 19.68 kg/m2  Wt Readings from Last 3 Encounters:  01/13/15 121 lb (54.885 kg)    General: Appears her stated age,  in NAD. Skin: Warm, dry and intact. Cardiovascular: Normal rate and rhythm. S1,S2 noted.  No murmur, rubs or gallops noted. No JVD or BLE edema.  Pulmonary/Chest: Normal effort and positive vesicular  breath sounds. No respiratory distress. No wheezes, rales or ronchi noted.  Abdomen: Soft and nontender. Normal bowel sounds. No CVA tenderness. Musculoskeletal: Joint enlaragement noted in hands bilaterally.  Neurological: Alert and oriented.   Psychiatric: Mood and affect mildly flat.  BMET    Component Value Date/Time   NA 140 07/31/2006 1047   K 4.0 07/31/2006 1047   CL 104 07/31/2006 1047   CO2 31 07/31/2006 1047   GLUCOSE 84 07/31/2006 1047   BUN 13 07/31/2006 1047   CREATININE 0.84 07/31/2006 1047   CALCIUM 8.9 07/31/2006 1047   GFRNONAA >60 07/31/2006 1047   GFRAA  07/31/2006 1047    >60        The eGFR has been calculated using the MDRD equation. This calculation has not been validated in all clinical    Lipid Panel  No results found for: CHOL, TRIG, HDL, CHOLHDL, VLDL, LDLCALC  CBC    Component Value Date/Time   WBC 4.1 07/31/2006 1047   RBC 4.17 07/31/2006 1047   HGB 13.5 07/31/2006 1047   HCT 40.2 07/31/2006 1047   PLT 250 07/31/2006 1047   MCV 96.4 07/31/2006 1047   MCHC 33.5 07/31/2006 1047   RDW 12.8 07/31/2006 1047    Hgb A1C No results found for: HGBA1C   Assessment and Plan:  Dysuria secondary to UTI:  Urinalysis: 3+ leuks, pos nitrites, 3+ blood Will send urine culture eRx for Cipro 250 mg BID x 5 days Push fluids  RTC in 6 months for Medicare Wellness/ follow up

## 2015-01-13 NOTE — Assessment & Plan Note (Signed)
Will review the bone density exam she brought me and call her with any recommendations

## 2015-01-13 NOTE — Assessment & Plan Note (Signed)
Consider starting Mirilax daily

## 2015-01-13 NOTE — Assessment & Plan Note (Signed)
I do not think this is well controlled on Trazadone but she does not want to change because she has been on it so long Continue Clonazepam BID Advised her that I want her to stop the Ambien

## 2015-01-15 LAB — URINE CULTURE

## 2015-01-27 ENCOUNTER — Other Ambulatory Visit: Payer: Self-pay

## 2015-01-27 MED ORDER — TRAZODONE HCL 100 MG PO TABS: 100.0000 mg | ORAL_TABLET | Freq: Every day | ORAL | Status: DC

## 2015-01-27 NOTE — Telephone Encounter (Signed)
Pt left v/m requesting refill trazodone. Pt established with Avie Echevaria NP on 01/13/15. Please advise.pt request cb when refilled.

## 2015-01-27 NOTE — Telephone Encounter (Signed)
Ok to phone in [place],

## 2015-01-29 NOTE — Telephone Encounter (Signed)
Pt called to ck on status of refill;spoke with Estill Bamberg at [facility] and rx ready for pick up. Pt voiced understanding.

## 2015-02-09 ENCOUNTER — Telehealth: Payer: Self-pay

## 2015-02-09 ENCOUNTER — Other Ambulatory Visit: Payer: Self-pay | Admitting: Internal Medicine

## 2015-02-09 ENCOUNTER — Ambulatory Visit: Payer: Medicare Other | Admitting: Internal Medicine

## 2015-02-09 MED ORDER — CLONAZEPAM 1 MG PO TABS: 1.0000 mg | ORAL_TABLET | Freq: Two times a day (BID) | ORAL | Status: DC

## 2015-02-09 MED ORDER — FLUCONAZOLE 150 MG PO TABS
150.0000 mg | ORAL_TABLET | Freq: Once | ORAL | Status: DC
Start: 2015-02-09 — End: 2015-02-23

## 2015-02-09 NOTE — Telephone Encounter (Signed)
Pt left v/m; 01/13/15 seen to establish care and UTI; pt took abx that stopped the UTI but now has yeast infection, vaginal itching; pt tried OTC med and has not helped. Pt request med sent to [place]. Pt also request refill clonazepam to [facility]. R Baity NP has not prescribed clonazepam before.

## 2015-02-09 NOTE — Telephone Encounter (Signed)
Klonopin called into pharmacy 

## 2015-02-09 NOTE — Telephone Encounter (Signed)
[facility]lucan [facility] Please phone in Clonazepam

## 2015-02-11 ENCOUNTER — Other Ambulatory Visit: Payer: Self-pay | Admitting: Internal Medicine

## 2015-02-11 NOTE — Telephone Encounter (Signed)
This was approved 2 days ago. Has it been called in?

## 2015-02-11 NOTE — Telephone Encounter (Signed)
I called this in 02/09/2015

## 2015-02-12 ENCOUNTER — Other Ambulatory Visit: Payer: Self-pay | Admitting: Internal Medicine

## 2015-02-12 NOTE — Telephone Encounter (Signed)
Electronic refill request. Last Filled:    30 tablet 0 02/09/2015  Last office visit:   01/13/15 UTI  Please advise.

## 2015-02-13 ENCOUNTER — Other Ambulatory Visit: Payer: Self-pay

## 2015-02-13 ENCOUNTER — Other Ambulatory Visit: Payer: Self-pay | Admitting: Internal Medicine

## 2015-02-13 ENCOUNTER — Encounter: Payer: Self-pay | Admitting: Internal Medicine

## 2015-02-13 MED ORDER — CLONAZEPAM 1 MG PO TABS: 1.0000 mg | ORAL_TABLET | Freq: Two times a day (BID) | ORAL | Status: DC

## 2015-02-13 NOTE — Telephone Encounter (Signed)
Rx called in to pharmacy. 

## 2015-02-13 NOTE — Telephone Encounter (Signed)
[facility] and they stated they did not receive Rx I left on VM on 02/09/2015---called in Rx as prescribed

## 2015-02-13 NOTE — Telephone Encounter (Signed)
This was approved 02/09/15, was it called in?

## 2015-02-18 ENCOUNTER — Encounter: Payer: Self-pay | Admitting: Internal Medicine

## 2015-02-20 ENCOUNTER — Telehealth: Payer: Self-pay | Admitting: Internal Medicine

## 2015-02-20 NOTE — Telephone Encounter (Signed)
Per note, nurse will call in [place] for itching.  This is reasonable.  Routed to PCP as FYI.

## 2015-02-20 NOTE — Telephone Encounter (Signed)
Pt has appt 02/23/15 at 2 to see Avie Echevaria NP. Avie Echevaria NP is out of office today will send note to Dr Damita Dunnings.

## 2015-02-20 NOTE — Telephone Encounter (Signed)
[facility]  Patient Name: LAURA-LEE KRODEL  DOB: 05/17/1931    Initial Comment Caller states, had a bad UTI a month ago , which she treated for, then she got a yeast infection and was treated for this, but now she has terrible vaginal pains    Nurse Assessment  Nurse: Harlow Mares, RN, Suanne Marker Date/Time Eilene Ghazi Time): 02/20/2015 2:46:55 PM  Confirm and document reason for call. If symptomatic, describe symptoms. You must click the next button to save text entered. ---Caller states, had a bad UTI a month ago , which she treated for, then she got a yeast infection and was treated for this, but now she has terrible vaginal pains. Reports that she doesn't feel like the yeast infection has never gotten well. She is having itching vaginally and in her rectal area as well. Denies vaginal discharge. Has been using OTC meds to treat with no success. Reports that she believes that she was given Diflucan about 3 weeks ago. Reports symptoms got some better but has not completely gotten well. Vaginal burning and pain.  Has the patient traveled out of the country within the last 30 days? ---No  Does the patient have any new or worsening symptoms? ---Yes  Will a triage be completed? ---Yes  Related visit to physician within the last 2 weeks? ---No  Does the PT have any chronic conditions? (i.e. diabetes, asthma, etc.) ---Yes  List chronic conditions. ---depression, anxiety  Is this a behavioral health or substance abuse call? ---No    Nurse: Harlow Mares, RN, Suanne Marker Date/Time Eilene Ghazi Time): 02/20/2015 2:57:23 PM  Please select the assessment type ---Standing order   Additional Documentation ---Diflucan   Other current medications? ---Yes   List current medications. ---Trazadone, Clonazapam   Medication allergies? ---No   Pharmacy name and phone number. ---[facility]) [phone]   Additional Documentation ---Advised caller that nurse will call in [place] for  itching.      Guidelines    Guideline Title Affirmed Question Affirmed Notes       Final Disposition User        Comments  Appt scheduled with Eber Hong, NP at the [facility] location for Monday 02/23/15 @ 2pm, caller informed.

## 2015-02-20 NOTE — Telephone Encounter (Signed)
Agree with plan 

## 2015-02-23 ENCOUNTER — Encounter: Payer: Self-pay | Admitting: Internal Medicine

## 2015-02-23 ENCOUNTER — Ambulatory Visit (INDEPENDENT_AMBULATORY_CARE_PROVIDER_SITE_OTHER): Payer: Medicare Other | Admitting: Internal Medicine

## 2015-02-23 VITALS — BP 124/72 | HR 71 | Temp 97.5°F | Wt 121.0 lb

## 2015-02-23 DIAGNOSIS — B3731 Acute candidiasis of vulva and vagina: Secondary | ICD-10-CM

## 2015-02-23 DIAGNOSIS — B373 Candidiasis of vulva and vagina: Secondary | ICD-10-CM

## 2015-02-23 DIAGNOSIS — L290 Pruritus ani: Secondary | ICD-10-CM

## 2015-02-23 MED ORDER — FLUCONAZOLE 150 MG PO TABS: 150.0000 mg | ORAL_TABLET | Freq: Once | ORAL | Status: DC

## 2015-02-23 NOTE — Progress Notes (Signed)
Pre visit review using our clinic review tool, if applicable. No additional management support is needed unless otherwise documented below in the visit note. 

## 2015-02-23 NOTE — Patient Instructions (Signed)

## 2015-02-23 NOTE — Progress Notes (Signed)
Subjective:    Patient ID: Catherine Young, female    DOB: 1932-01-09, 82 y.o.   MRN: [id number]  HPI  Pt presents today c/o vaginal itching and burning for the past 5 days. She was having pain with urination which has subsided. She was treated one month prior for a UTI and a yeast infection following UTI treatment. She felt like her symptoms never fully resolved. On Friday 02/20/15 Dr. Damita Dunnings called in a prescription for Diflucan. Pt states after taking the one tab of Diflucan her symptoms have improved. Intermittent episodes of urgency. She denies discharge, pain with urination or blood in her urine.   She complains of rectal itching. She has a history of bowel incontinence. Last episode of incontinence was 2 months ago. Takes Immodium when she has a case of fecal incontinence. Denies blood in her stool, abdominal pain or rectal pain with defecation.     Review of Systems  Past Medical History  Diagnosis Date  . Arthritis   . Depression   . Intestinal obstruction ([place])   . Osteoporosis     Current Outpatient Prescriptions  Medication Sig Dispense Refill  . aspirin 81 MG tablet Take 81 mg by mouth daily.    . Cholecalciferol (VITAMIN D3) 1000 UNITS CAPS Take 1 capsule by mouth daily.    . clonazePAM (KLONOPIN) 1 MG tablet Take 1 tablet (1 mg total) by mouth 2 (two) times daily. 30 tablet 0  . traZODone (DESYREL) 100 MG tablet Take 1 tablet (100 mg total) by mouth at bedtime. 30 tablet 2   No current facility-administered medications for this visit.    No Known Allergies  Family History  Problem Relation Age of Onset  . Arthritis Mother   . Colon cancer Mother   . Cancer Mother     colon  . Arthritis Sister   . Colon cancer Sister   . Cancer Sister     colon  . Lung cancer Brother   . Cancer Brother     lung  . Arthritis Sister   . Colon cancer Sister   . Cancer Sister     colon  . Diabetes Father     Social History   Social History  . Marital Status:  Widowed    Spouse Name: N/A  . Number of Children: N/A  . Years of Education: N/A   Occupational History  . Not on file.   Social History Main Topics  . Smoking status: Former Research scientist (life sciences)  . Smokeless tobacco: Never Used     Comment: quit 1995  . Alcohol Use: 0.0 oz/week    0 Standard drinks or equivalent per week     Comment: nightly glass of wine  . Drug Use: Not on file  . Sexual Activity: No   Other Topics Concern  . Not on file   Social History Narrative    Respiratory: Postive cough. Denies difficulty breathing or shortness of breath. Cardiovascular: Denies chest pain or chest tightness. Gastrointestinal: Denies abdominal pain, bloating, constipation, diarrhea or blood in the stool.  GU: Positive vaginal itching and burning. Occasional episodes of urgency. Denies frequency, pain with urination, burning sensation, blood in urine, odor or discharge. Skin: Positive dry skin on lower back. Bruise right lateral upper extremity.   No other specific complaints in a complete review of systems (except as listed in HPI above).     Objective:   Physical Exam BP 124/72 mmHg  Pulse 71  Temp(Src) 97.5 F (36.4 C) (Oral)  Wt 121 lb (54.885 kg)  SpO2 97% Wt Readings from Last 3 Encounters:  02/23/15 121 lb (54.885 kg)  01/13/15 121 lb (54.885 kg)    General: Appears her stated age, in NAD. Skin: Warm, dry and intact. Dry skin on lower back. Positive bruise right lateral upper extremity, 3 cm in diameter. Cardiovascular: Normal rate and rhythm. S1,S2 noted.  No murmur, rubs or gallops noted.  Pulmonary/Chest: Normal effort and positive vesicular breath sounds. No respiratory distress. No wheezes, rales or ronchi noted.  Pelvic: Normal female anatomy. No discharge noted at the vaginal opening. No irritation noted. Rectal: No external hemorrhoid or irritation noted.     BMET    Component Value Date/Time   NA 140 07/31/2006 1047   K 4.0 07/31/2006 1047   CL 104 07/31/2006  1047   CO2 31 07/31/2006 1047   GLUCOSE 84 07/31/2006 1047   BUN 13 07/31/2006 1047   CREATININE 0.84 07/31/2006 1047   CALCIUM 8.9 07/31/2006 1047   GFRNONAA >60 07/31/2006 1047   GFRAA  07/31/2006 1047    >60        The eGFR has been calculated using the MDRD equation. This calculation has not been validated in all clinical    Lipid Panel  No results found for: CHOL, TRIG, HDL, CHOLHDL, VLDL, LDLCALC  CBC    Component Value Date/Time   WBC 4.1 07/31/2006 1047   RBC 4.17 07/31/2006 1047   HGB 13.4 06/27/2013   HCT 42 06/27/2013   PLT 250 07/31/2006 1047   MCV 96.4 07/31/2006 1047   MCHC 33.5 07/31/2006 1047   RDW 12.8 07/31/2006 1047    Hgb A1C No results found for: HGBA1C        Assessment & Plan:  Vaginal and rectal itching:  Yeast on wet prep Diflucan 115m PO once, take Tuesday 02/24/15  RTC as needed or if symptoms do not subside.

## 2015-02-24 ENCOUNTER — Other Ambulatory Visit: Payer: Self-pay | Admitting: Internal Medicine

## 2015-02-26 ENCOUNTER — Telehealth: Payer: Self-pay

## 2015-02-26 NOTE — Telephone Encounter (Signed)
Pt left v/m; [facility] and [facility] to [facility]. Nothing further needed. Changed pharmacy as requested.

## 2015-03-10 ENCOUNTER — Encounter: Payer: Self-pay | Admitting: Internal Medicine

## 2015-03-10 ENCOUNTER — Ambulatory Visit (INDEPENDENT_AMBULATORY_CARE_PROVIDER_SITE_OTHER): Payer: Medicare Other | Admitting: Internal Medicine

## 2015-03-10 VITALS — BP 122/74 | HR 50 | Temp 97.6°F | Wt 121.0 lb

## 2015-03-10 DIAGNOSIS — B373 Candidiasis of vulva and vagina: Secondary | ICD-10-CM | POA: Diagnosis not present

## 2015-03-10 DIAGNOSIS — B3731 Acute candidiasis of vulva and vagina: Secondary | ICD-10-CM

## 2015-03-10 MED ORDER — TERCONAZOLE 80 MG VA SUPP: 80.0000 mg | Freq: Every day | VAGINAL | Status: DC

## 2015-03-10 NOTE — Progress Notes (Signed)
Pre visit review using our clinic review tool, if applicable. No additional management support is needed unless otherwise documented below in the visit note. 

## 2015-03-10 NOTE — Patient Instructions (Signed)

## 2015-03-10 NOTE — Progress Notes (Signed)
Subjective:    Patient ID: Catherine Young, female    DOB: Aug 29, 1931, 82 y.o.   MRN: [id number]  HPI  Pt presents to the clinic today with c/o vaginal itching and irritation. This has been going on intermittently for the last 6 weeks. She has been treated for vaginal candidiasis x 2 with Diflucan but reports it only provides short term relief. She does have stress incontinence, she uses a panty liner. She denies vaginal bleeding or discharge. She has had a hysterectomy but still has her ovaries. She has not tried anything OTC.  Review of Systems  Past Medical History  Diagnosis Date  . Arthritis   . Depression   . Intestinal obstruction ([place])   . Osteoporosis     Current Outpatient Prescriptions  Medication Sig Dispense Refill  . aspirin 81 MG tablet Take 81 mg by mouth daily.    . Cholecalciferol (VITAMIN D3) 1000 UNITS CAPS Take 1 capsule by mouth daily.    . clonazePAM (KLONOPIN) 1 MG tablet Take 1 tablet (1 mg total) by mouth 2 (two) times daily. 30 tablet 0  . traZODone (DESYREL) 100 MG tablet Take 1 tablet (100 mg total) by mouth at bedtime. 30 tablet 2   No current facility-administered medications for this visit.    No Known Allergies  Family History  Problem Relation Age of Onset  . Arthritis Mother   . Colon cancer Mother   . Cancer Mother     colon  . Arthritis Sister   . Colon cancer Sister   . Cancer Sister     colon  . Lung cancer Brother   . Cancer Brother     lung  . Arthritis Sister   . Colon cancer Sister   . Cancer Sister     colon  . Diabetes Father     Social History   Social History  . Marital Status: Widowed    Spouse Name: N/A  . Number of Children: N/A  . Years of Education: N/A   Occupational History  . Not on file.   Social History Main Topics  . Smoking status: Former Research scientist (life sciences)  . Smokeless tobacco: Never Used     Comment: quit 1995  . Alcohol Use: 0.0 oz/week    0 Standard drinks or equivalent per week     Comment:  nightly glass of wine  . Drug Use: Not on file  . Sexual Activity: No   Other Topics Concern  . Not on file   Social History Narrative     Constitutional: Denies fever, malaise, fatigue, headache or abrupt weight changes.  Gastrointestinal: Denies abdominal pain, bloating, constipation, diarrhea or blood in the stool.  GU: Pt reports vaginal itching and irritation. Denies urgency, frequency, pain with urination, burning sensation, blood in urine, odor or discharge.   No other specific complaints in a complete review of systems (except as listed in HPI above).     Objective:   Physical Exam  BP 122/74 mmHg  Pulse 50  Temp(Src) 97.6 F (36.4 C) (Oral)  Wt 121 lb (54.885 kg)  SpO2 99% Wt Readings from Last 3 Encounters:  03/10/15 121 lb (54.885 kg)  02/23/15 121 lb (54.885 kg)  01/13/15 121 lb (54.885 kg)    General: Appears her stated age, in NAD.  Cardiovascular: Normal rate and rhythm. S1,S2 noted.  No murmur, rubs or gallops noted. Pulmonary/Chest: Normal effort and positive vesicular breath sounds. No respiratory distress. No wheezes, rales or ronchi noted.  Abdomen: Soft and nontender. Normal bowel sounds. Pelvic: Normal female anatomy. No evidence of external or internal irritation. No evidence of hypopigmentation on the exterior labia. No discharge noted.  BMET    Component Value Date/Time   NA 140 07/31/2006 1047   K 4.0 07/31/2006 1047   CL 104 07/31/2006 1047   CO2 31 07/31/2006 1047   GLUCOSE 84 07/31/2006 1047   BUN 13 07/31/2006 1047   CREATININE 0.84 07/31/2006 1047   CALCIUM 8.9 07/31/2006 1047   GFRNONAA >60 07/31/2006 1047   GFRAA  07/31/2006 1047    >60        The eGFR has been calculated using the MDRD equation. This calculation has not been validated in all clinical    Lipid Panel  No results found for: CHOL, TRIG, HDL, CHOLHDL, VLDL, LDLCALC  CBC    Component Value Date/Time   WBC 4.1 07/31/2006 1047   RBC 4.17 07/31/2006 1047    HGB 13.4 06/27/2013   HCT 42 06/27/2013   PLT 250 07/31/2006 1047   MCV 96.4 07/31/2006 1047   MCHC 33.5 07/31/2006 1047   RDW 12.8 07/31/2006 1047    Hgb A1C No results found for: HGBA1C       Assessment & Plan:   Vaginal itching and irritation:  Wet prep: + yeast eRx for Terazol suppositories nightly x 14 days If symptoms persist, consider referral to GYN Avoid water aerobics until we can get this cleared up  RTC as needed or if symptoms persist or worsen

## 2015-03-15 ENCOUNTER — Other Ambulatory Visit: Payer: Self-pay | Admitting: Internal Medicine

## 2015-03-16 MED ORDER — CLONAZEPAM 1 MG PO TABS: 1.0000 mg | ORAL_TABLET | Freq: Two times a day (BID) | ORAL | Status: DC

## 2015-03-16 NOTE — Telephone Encounter (Signed)
Last filled 02/13/2015--please advise

## 2015-03-16 NOTE — Telephone Encounter (Signed)
Rx called in to pharmacy. 

## 2015-03-16 NOTE — Telephone Encounter (Signed)
Ok to phone in Clonazepam 

## 2015-03-17 ENCOUNTER — Encounter: Payer: Self-pay | Admitting: Internal Medicine

## 2015-03-17 ENCOUNTER — Other Ambulatory Visit: Payer: Self-pay | Admitting: Internal Medicine

## 2015-03-17 MED ORDER — CLONAZEPAM 1 MG PO TABS: 1.0000 mg | ORAL_TABLET | Freq: Two times a day (BID) | ORAL | Status: DC

## 2015-03-17 NOTE — Telephone Encounter (Signed)
Pt reports Rx should be #60---I looked in history and it has been #30--please advise

## 2015-03-17 NOTE — Telephone Encounter (Signed)
Called pharmacy to change quantity to #60 instead of #30

## 2015-03-17 NOTE — Telephone Encounter (Signed)
It should be 60, send in additional 30 and change quant for next RX to 60

## 2015-03-17 NOTE — Addendum Note (Signed)
Addended by: Lurlean Nanny on: 03/17/2015 02:16 PM   Modules accepted: Orders

## 2015-03-25 ENCOUNTER — Telehealth: Payer: Self-pay

## 2015-03-25 NOTE — Telephone Encounter (Signed)
Spoke to pt about not needing her appt for tomorrow to get GYN referral---pt wants to have referral for GYN and wants to make sure the provider is in her network-- appt canceled

## 2015-03-26 ENCOUNTER — Ambulatory Visit: Payer: Self-pay | Admitting: Internal Medicine

## 2015-03-26 ENCOUNTER — Other Ambulatory Visit: Payer: Self-pay | Admitting: Internal Medicine

## 2015-03-26 DIAGNOSIS — N898 Other specified noninflammatory disorders of vagina: Secondary | ICD-10-CM

## 2015-03-26 NOTE — Telephone Encounter (Signed)
Referral placed.

## 2015-04-01 ENCOUNTER — Telehealth: Payer: Self-pay

## 2015-04-01 NOTE — Telephone Encounter (Signed)
[facility] to [facility]. Pt request new rx for trazodone sent to [facility] [address] Spoke with Richardson Landry at [facility] and has available refill for trazodone. Unable to reach pt by phone and I called April at [facility]. And she will request refill transfer from [place] at [place] now. Left detailed v/m at home # per [facility] with this info.

## 2015-04-28 ENCOUNTER — Telehealth: Payer: Self-pay | Admitting: Internal Medicine

## 2015-04-28 NOTE — Telephone Encounter (Signed)
Ok to phone in Clonazepam and Trazadone

## 2015-04-28 NOTE — Telephone Encounter (Signed)
Klonopin last filled 03/17/2015--please advise

## 2015-04-29 NOTE — Telephone Encounter (Signed)
Rx called in to pharmacy. 

## 2015-04-29 NOTE — Telephone Encounter (Signed)
Pt left v/m requesting status of refill for clonazepam.

## 2015-05-26 ENCOUNTER — Other Ambulatory Visit: Payer: Self-pay | Admitting: Internal Medicine

## 2015-05-26 NOTE — Telephone Encounter (Signed)
Ok to phone on Clonazepam to be filled on or after 5/5

## 2015-05-26 NOTE — Telephone Encounter (Signed)
Last filled 04/29/2015--please advise

## 2015-05-28 MED ORDER — CLONAZEPAM 1 MG PO TABS: 1.0000 mg | ORAL_TABLET | Freq: Two times a day (BID) | ORAL | Status: DC | PRN

## 2015-05-28 NOTE — Telephone Encounter (Signed)
Rx called in to pharmacy. 

## 2015-05-28 NOTE — Addendum Note (Signed)
Addended by: Lurlean Nanny on: 05/28/2015 05:09 PM   Modules accepted: Orders

## 2015-06-29 ENCOUNTER — Other Ambulatory Visit: Payer: Self-pay | Admitting: Internal Medicine

## 2015-06-29 NOTE — Telephone Encounter (Signed)
Ok to phone in Clonazepam 

## 2015-06-29 NOTE — Telephone Encounter (Signed)
Last filled 05/28/2015--please advise

## 2015-06-30 NOTE — Telephone Encounter (Signed)
Rx called in to pharmacy. 

## 2015-07-14 ENCOUNTER — Ambulatory Visit (INDEPENDENT_AMBULATORY_CARE_PROVIDER_SITE_OTHER): Payer: Medicare Other | Admitting: Internal Medicine

## 2015-07-14 ENCOUNTER — Ambulatory Visit (INDEPENDENT_AMBULATORY_CARE_PROVIDER_SITE_OTHER)
Admission: RE | Admit: 2015-07-14 | Discharge: 2015-07-14 | Disposition: A | Discharge status: Home / Self Care | Payer: Medicare Other | Source: Ambulatory Visit | Attending: Internal Medicine | Admitting: Internal Medicine

## 2015-07-14 ENCOUNTER — Encounter: Payer: Self-pay | Admitting: Internal Medicine

## 2015-07-14 VITALS — BP 120/72 | HR 48 | Temp 98.0°F | Ht 66.0 in | Wt 122.5 lb

## 2015-07-14 DIAGNOSIS — Z0000 Encounter for general adult medical examination without abnormal findings: Secondary | ICD-10-CM | POA: Diagnosis not present

## 2015-07-14 DIAGNOSIS — M533 Sacrococcygeal disorders, not elsewhere classified: Secondary | ICD-10-CM

## 2015-07-14 DIAGNOSIS — L989 Disorder of the skin and subcutaneous tissue, unspecified: Secondary | ICD-10-CM

## 2015-07-14 NOTE — Progress Notes (Signed)
Pre visit review using our clinic review tool, if applicable. No additional management support is needed unless otherwise documented below in the visit note. 

## 2015-07-14 NOTE — Progress Notes (Signed)
HPI:  Pt presents to the clinic today for her Medicare Wellness Exam.  Past Medical History  Diagnosis Date  . Arthritis   . Depression   . Intestinal obstruction ([place])   . Osteoporosis     Current Outpatient Prescriptions  Medication Sig Dispense Refill  . aspirin 81 MG tablet Take 81 mg by mouth daily.    . Cholecalciferol (VITAMIN D3) 1000 UNITS CAPS Take 1 capsule by mouth daily.    . clonazePAM (KLONOPIN) 1 MG tablet take 1 tablet by mouth twice a day if needed 60 tablet 0  . Melatonin 5 MG CAPS Take 1 capsule by mouth at bedtime as needed.    . traZODone (DESYREL) 100 MG tablet take 1 tablet by mouth at bedtime 30 tablet 2   No current facility-administered medications for this visit.    No Known Allergies  Family History  Problem Relation Age of Onset  . Arthritis Mother   . Colon cancer Mother   . Cancer Mother     colon  . Arthritis Sister   . Colon cancer Sister   . Cancer Sister     colon  . Lung cancer Brother   . Cancer Brother     lung  . Arthritis Sister   . Colon cancer Sister   . Cancer Sister     colon  . Diabetes Father     Social History   Social History  . Marital Status: Widowed    Spouse Name: N/A  . Number of Children: N/A  . Years of Education: N/A   Occupational History  . Not on file.   Social History Main Topics  . Smoking status: Former Research scientist (life sciences)  . Smokeless tobacco: Never Used     Comment: quit 1995  . Alcohol Use: 0.0 oz/week    0 Standard drinks or equivalent per week     Comment: nightly glass of wine  . Drug Use: Not on file  . Sexual Activity: No   Other Topics Concern  . Not on file   Social History Narrative    Hospitiliaztions: None  Health Maintenance:    Flu: 10/2014  Tetanus: not sure  Pneumovax: within the last 5 years  Prevnar: never  Zostavax: never  Mammogram: 2013, no longer wants to screen  Pap Smear: no longer screening  Bone Density: 08/2014  Colon Screening: 2012, no longer  screening  Eye Doctor: 03/2015 at [place] Exam: biannually   Providers:   PCP: [name], NP-C    I have personally reviewed and have noted:  1. The patient's medical and social history 2. Their use of alcohol, tobacco or illicit drugs 3. Their current medications and supplements 4. The patient's functional ability including ADL's, fall risks, home safety  risks and hearing or visual impairment. 5. Diet and physical activities 6. Evidence for depression or mood disorder  Subjective:   Review of Systems:   Constitutional: Denies fever, malaise, fatigue, headache or abrupt weight changes.  HEENT: Denies eye pain, eye redness, ear pain, ringing in the ears, wax buildup, runny nose, nasal congestion, bloody nose, or sore throat. Respiratory: Denies difficulty breathing, shortness of breath, cough or sputum production.   Cardiovascular: Denies chest pain, chest tightness, palpitations or swelling in the hands or feet.  Gastrointestinal: Denies abdominal pain, bloating, constipation, diarrhea or blood in the stool.  GU: Denies urgency, frequency, pain with urination, burning sensation, blood in urine, odor or discharge. Musculoskeletal: Pt reports low back pain.  Denies decrease in range of motion, muscle pain or joint pain and swelling.  Skin: Pt reports skin lesion on nose. Denies redness, rashes, or ulcercations.  Neurological: Pt reports insomnia. Denies dizziness, difficulty with memory, difficulty with speech or problems with balance and coordination.  Psych: Pt has history of anxiety and depression. Denies SI/HI.  No other specific complaints in a complete review of systems (except as listed in HPI above).  Objective:  PE:   BP 120/72 mmHg  Pulse 48  Temp(Src) 98 F (36.7 C) (Oral)  Ht 5' 6" (1.676 m)  Wt 122 lb 8 oz (55.566 kg)  BMI 19.78 kg/m2  SpO2 97%  Wt Readings from Last 3 Encounters:  07/14/15 122 lb 8 oz (55.566 kg)  03/10/15 121 lb  (54.885 kg)  02/23/15 121 lb (54.885 kg)    General: Appears her stated age, in NAD. Skin: Warm, dry and intact. 1 cm skin colored lesion to anterior nose. Small scaly lesion noted on bridge of nose. Cardiovascular: Normal rate and rhythm. S1,S2 noted.  No murmur, rubs or gallops noted.  Pulmonary/Chest: Normal effort and positive vesicular breath sounds. No respiratory distress. No wheezes, rales or ronchi noted.  Musculoskeletal: Normal flexion, extension and rotation of the spine. Pain with palpation over the left SI joint. No difficulty with gait. Neurological: Alert and oriented.  Psychiatric: Mood and affect normal. Behavior is normal. Judgment and thought content normal.    BMET    Component Value Date/Time   NA 140 07/31/2006 1047   K 4.0 07/31/2006 1047   CL 104 07/31/2006 1047   CO2 31 07/31/2006 1047   GLUCOSE 84 07/31/2006 1047   BUN 13 07/31/2006 1047   CREATININE 0.84 07/31/2006 1047   CALCIUM 8.9 07/31/2006 1047   GFRNONAA >60 07/31/2006 1047   GFRAA  07/31/2006 1047    >60        The eGFR has been calculated using the MDRD equation. This calculation has not been validated in all clinical    Lipid Panel  No results found for: CHOL, TRIG, HDL, CHOLHDL, VLDL, LDLCALC  CBC    Component Value Date/Time   WBC 4.1 07/31/2006 1047   RBC 4.17 07/31/2006 1047   HGB 13.4 06/27/2013   HCT 42 06/27/2013   PLT 250 07/31/2006 1047   MCV 96.4 07/31/2006 1047   MCHC 33.5 07/31/2006 1047   RDW 12.8 07/31/2006 1047    Hgb A1C No results found for: HGBA1C    Assessment and Plan:   Medicare Annual Wellness Visit:  Diet: Heart healthy Physical activity: She walks 3-4 times per week and water aerobic 2 x week Depression/mood screen: Negative Hearing: Intact to whispered voice Visual acuity: Grossly normal, performs annual eye exam  ADLs: Capable Fall risk: None Home safety: Good Cognitive evaluation: Intact to orientation, naming, recall and repetition EOL  planning: Adv directives, DNR/ I agree  Preventative Medicine: Will request immunizations from [place]. She no longer wants mammogram, pap smears, bone density or colon cancer screening. She will continue to see an eye doctor and dentist at least annually  SI Joint pain:  Xray lumbar spine today Aleve as needed  Skin lesion of nose:  Referral placed to dermatology for further evaluation   Next appointment: 6 mont follow up chronic conditions

## 2015-07-14 NOTE — Patient Instructions (Signed)
Sacroiliac Joint Dysfunction Sacroiliac joint dysfunction is a condition that causes inflammation on one or both sides of the sacroiliac (SI) joint. The SI joint connects the lower part of the spine (sacrum) with the two upper portions of the pelvis (ilium). This condition causes deep aching or burning pain in the low back. In some cases, the pain may also spread into one or both buttocks or hips or spread down the legs. CAUSES This condition may be caused by:  Pregnancy. During pregnancy, extra stress is put on the SI joints because the pelvis widens.  Injury, such as:  Car accidents.  Sport-related injuries.  Work-related injuries.  Having one leg that is shorter than the other.  Conditions that affect the joints, such as:  Rheumatoid arthritis.  Gout.  Psoriatic arthritis.  Joint infection (septic arthritis). Sometimes, the cause of SI joint dysfunction is not known. SYMPTOMS Symptoms of this condition include:  Aching or burning pain in the lower back. The pain may also spread to other areas, such as:  Buttocks.  Groin.  Thighs and legs.  Muscle spasms in or around the painful areas.  Increased pain when standing, walking, running, stair climbing, bending, or lifting. DIAGNOSIS Your health care provider will do a physical exam and take your medical history. During the exam, the health care provider may move one or both of your legs to different positions to check for pain. Various tests may be done to help verify the diagnosis, including:  Imaging tests to look for other causes of pain. These may include:  MRI.  CT scan.  Bone scan.  Diagnostic injection. A numbing medicine is injected into the SI joint using a needle. If the pain is temporarily improved or stopped after the injection, this can indicate that SI joint dysfunction is the problem. TREATMENT Treatment may vary depending on the cause and severity of your condition. Treatment options may  include:  Applying ice or heat to the lower back area. This can help to reduce pain and muscle spasms.  Medicines to relieve pain or inflammation or to relax the muscles.  Wearing a back brace (sacroiliac brace) to help support the joint while your back is healing.  Physical therapy to increase muscle strength around the joint and flexibility at the joint. This may also involve learning proper body positions and ways of moving to relieve stress on the joint.  Direct manipulation of the SI joint.  Injections of steroid medicine into the joint in order to reduce pain and swelling.  Radiofrequency ablation to burn away nerves that are carrying pain messages from the joint.  Use of a device that provides electrical stimulation in order to reduce pain at the joint.  Surgery to put in screws and plates that limit or prevent joint motion. This is rare. HOME CARE INSTRUCTIONS  Rest as needed. Limit your activities as directed by your health care provider.  Take medicines only as directed by your health care provider.  If directed, apply ice to the affected area:  Put ice in a plastic bag.  Place a towel between your skin and the bag.  Leave the ice on for 20 minutes, 2-3 times per day.  Use a heating pad or a moist heat pack as directed by your health care provider.  Exercise as directed by your health care provider or physical therapist.  Keep all follow-up visits as directed by your health care provider. This is important. SEEK MEDICAL CARE IF:  Your pain is not controlled   with medicine.  You have a fever.  You have increasingly severe pain. SEEK IMMEDIATE MEDICAL CARE IF:  You have weakness, numbness, or tingling in your legs or feet.  You lose control of your bladder or bowel.   This information is not intended to replace advice given to you by your health care provider. Make sure you discuss any questions you have with your health care provider.   Document Released:  04/08/2008 Document Revised: 05/27/2014 Document Reviewed: 09/17/2013 Elsevier Interactive Patient Education 2016 [employer]  

## 2015-07-30 ENCOUNTER — Other Ambulatory Visit: Payer: Self-pay | Admitting: Internal Medicine

## 2015-07-30 NOTE — Telephone Encounter (Signed)
Ok to refill as requested 

## 2015-07-30 NOTE — Telephone Encounter (Signed)
Rx called in to pharmacy. 

## 2015-07-30 NOTE — Telephone Encounter (Signed)
Baity pt--Klonopin last filled 06/29/2015--last OV [facility] wellness 06/2015--please advise

## 2015-08-10 ENCOUNTER — Encounter: Payer: Self-pay | Admitting: Internal Medicine

## 2015-08-10 ENCOUNTER — Ambulatory Visit (INDEPENDENT_AMBULATORY_CARE_PROVIDER_SITE_OTHER): Payer: Medicare Other | Admitting: Primary Care

## 2015-08-10 ENCOUNTER — Encounter: Payer: Self-pay | Admitting: Primary Care

## 2015-08-10 VITALS — BP 124/70 | HR 88 | Temp 97.8°F | Ht 66.0 in | Wt 123.1 lb

## 2015-08-10 DIAGNOSIS — N390 Urinary tract infection, site not specified: Secondary | ICD-10-CM | POA: Diagnosis not present

## 2015-08-10 DIAGNOSIS — R319 Hematuria, unspecified: Secondary | ICD-10-CM

## 2015-08-10 LAB — POC URINALSYSI DIPSTICK (AUTOMATED)
BILIRUBIN UA: NEGATIVE
Glucose, UA: NEGATIVE
KETONES UA: NEGATIVE
Nitrite, UA: NEGATIVE
PH UA: 6
Spec Grav, UA: 1.025
Urobilinogen, UA: NEGATIVE

## 2015-08-10 MED ORDER — CEPHALEXIN 500 MG PO CAPS: 500.0000 mg | ORAL_CAPSULE | Freq: Two times a day (BID) | ORAL | Status: DC

## 2015-08-10 MED ORDER — PHENAZOPYRIDINE HCL 200 MG PO TABS: 200.0000 mg | ORAL_TABLET | Freq: Three times a day (TID) | ORAL | Status: DC | PRN

## 2015-08-10 NOTE — Progress Notes (Signed)
Subjective:    Patient ID: Catherine Young, female    DOB: [birth year], 82 y.o.   MRN: [id number]  HPI  Catherine Young is an 82 year old female who presents today with a chief complaint of urinary frequency. She also reports dysuria and vaginal discharge with a reddish tint. Her symptoms began Thursday last week which have progressed since. She had to get up three times last night to urinate. Denies abdominal pain, fevers, nausea, flank pain. She's taken Advil OTC without much improvement.   Review of Systems  Constitutional: Negative for fever and fatigue.  Genitourinary: Positive for dysuria, frequency and vaginal discharge. Negative for hematuria and flank pain.       Past Medical History  Diagnosis Date  . Arthritis   . Depression   . Intestinal obstruction ([place])   . Osteoporosis      Social History   Social History  . Marital Status: Widowed    Spouse Name: N/A  . Number of Children: N/A  . Years of Education: N/A   Occupational History  . Not on file.   Social History Main Topics  . Smoking status: Former Research scientist (life sciences)  . Smokeless tobacco: Never Used     Comment: quit 1995  . Alcohol Use: 0.0 oz/week    0 Standard drinks or equivalent per week     Comment: nightly glass of wine  . Drug Use: Not on file  . Sexual Activity: No   Other Topics Concern  . Not on file   Social History Narrative    Past Surgical History  Procedure Laterality Date  . Appendectomy    . Abdominal hysterectomy      Family History  Problem Relation Age of Onset  . Arthritis Mother   . Colon cancer Mother   . Cancer Mother     colon  . Arthritis Sister   . Colon cancer Sister   . Cancer Sister     colon  . Lung cancer Brother   . Cancer Brother     lung  . Arthritis Sister   . Colon cancer Sister   . Cancer Sister     colon  . Diabetes Father     No Known Allergies  Current Outpatient Prescriptions on File Prior to Visit  Medication Sig Dispense Refill  . aspirin 81  MG tablet Take 81 mg by mouth daily.    . Cholecalciferol (VITAMIN D3) 1000 UNITS CAPS Take 1 capsule by mouth daily.    . clonazePAM (KLONOPIN) 1 MG tablet take 1 tablet by mouth twice a day if needed 60 tablet 0  . Melatonin 5 MG CAPS Take 1 capsule by mouth at bedtime as needed.    . traZODone (DESYREL) 100 MG tablet take 1 tablet by mouth at bedtime 30 tablet 2   No current facility-administered medications on file prior to visit.    BP 124/70 mmHg  Pulse 88  Temp(Src) 97.8 F (36.6 C) (Oral)  Ht 5\' 6"  (1.676 m)  Wt 123 lb 1.9 oz (55.847 kg)  BMI 19.88 kg/m2  SpO2 97%    Objective:   Physical Exam  Constitutional: She appears well-nourished.  Cardiovascular: Normal rate and regular rhythm.   Pulmonary/Chest: Effort normal and breath sounds normal.  Abdominal: Soft. Normal appearance. There is tenderness in the suprapubic area.  Skin: Skin is warm and dry.          Assessment & Plan:  Urinary Tract Infection:  Urinary frequency, dysuria, vaginal discharge  with red tint x 4 days. Overall no improvement with Advil. UA: 3+ leuks, 3+ blood, negative nitrites.  Culture sent.  Start Cephalexin 7 day course. Pyridium PRN. Fluids, rest. Return precautions provided.  Sheral Flow, NP

## 2015-08-10 NOTE — Patient Instructions (Addendum)
Start Cephalexin antibiotics. Take 1 capsule by mouth twice daily for 7 days.  You may take Pyridium as needed for burning and pelvic pressure. Take 1 tablet by mouth three times daily as needed.  Ensure you are staying hydrated with water.  Please notify me if no improvement in 3-4 days.  It was a pleasure meeting you!  Urinary Tract Infection Urinary tract infections (UTIs) can develop anywhere along your urinary tract. Your urinary tract is your body's drainage system for removing wastes and extra water. Your urinary tract includes two kidneys, two ureters, a bladder, and a urethra. Your kidneys are a pair of bean-shaped organs. Each kidney is about the size of your fist. They are located below your ribs, one on each side of your spine. CAUSES Infections are caused by microbes, which are microscopic organisms, including fungi, viruses, and bacteria. These organisms are so small that they can only be seen through a microscope. Bacteria are the microbes that most commonly cause UTIs. SYMPTOMS  Symptoms of UTIs may vary by age and gender of the patient and by the location of the infection. Symptoms in young women typically include a frequent and intense urge to urinate and a painful, burning feeling in the bladder or urethra during urination. Older women and men are more likely to be tired, shaky, and weak and have muscle aches and abdominal pain. A fever may mean the infection is in your kidneys. Other symptoms of a kidney infection include pain in your back or sides below the ribs, nausea, and vomiting. DIAGNOSIS To diagnose a UTI, your caregiver will ask you about your symptoms. Your caregiver will also ask you to provide a urine sample. The urine sample will be tested for bacteria and white blood cells. White blood cells are made by your body to help fight infection. TREATMENT  Typically, UTIs can be treated with medication. Because most UTIs are caused by a bacterial infection, they usually  can be treated with the use of antibiotics. The choice of antibiotic and length of treatment depend on your symptoms and the type of bacteria causing your infection. HOME CARE INSTRUCTIONS  If you were prescribed antibiotics, take them exactly as your caregiver instructs you. Finish the medication even if you feel better after you have only taken some of the medication.  Drink enough water and fluids to keep your urine clear or pale yellow.  Avoid caffeine, tea, and carbonated beverages. They tend to irritate your bladder.  Empty your bladder often. Avoid holding urine for long periods of time.  Empty your bladder before and after sexual intercourse.  After a bowel movement, women should cleanse from front to back. Use each tissue only once. SEEK MEDICAL CARE IF:   You have back pain.  You develop a fever.  Your symptoms do not begin to resolve within 3 days. SEEK IMMEDIATE MEDICAL CARE IF:   You have severe back pain or lower abdominal pain.  You develop chills.  You have nausea or vomiting.  You have continued burning or discomfort with urination. MAKE SURE YOU:   Understand these instructions.  Will watch your condition.  Will get help right away if you are not doing well or get worse.   This information is not intended to replace advice given to you by your health care provider. Make sure you discuss any questions you have with your health care provider.   Document Released: 10/20/2004 Document Revised: 10/01/2014 Document Reviewed: 02/18/2011 Elsevier Interactive Patient Education [employer].

## 2015-08-10 NOTE — Progress Notes (Signed)
Pre visit review using our clinic review tool, if applicable. No additional management support is needed unless otherwise documented below in the visit note. 

## 2015-08-10 NOTE — Addendum Note (Signed)
Addended by: Jacqualin Combes on: 08/10/2015 04:22 PM   Modules accepted: Orders, SmartSet

## 2015-08-13 LAB — URINE CULTURE: Colony Count: >=100000

## 2015-08-24 ENCOUNTER — Telehealth: Payer: Self-pay | Admitting: Internal Medicine

## 2015-08-24 NOTE — Telephone Encounter (Signed)
Spoken to patient and notified her that she called me already. It was an old message.

## 2015-08-24 NOTE — Telephone Encounter (Signed)
Pt returned your call from last week. Please call [phone]

## 2015-08-30 ENCOUNTER — Other Ambulatory Visit: Payer: Self-pay | Admitting: Internal Medicine

## 2015-08-31 NOTE — Telephone Encounter (Signed)
Last filled 07/30/15--please advise

## 2015-08-31 NOTE — Telephone Encounter (Signed)
Ok to phone in Clonazepam 

## 2015-09-01 ENCOUNTER — Other Ambulatory Visit: Payer: Self-pay | Admitting: Internal Medicine

## 2015-09-01 NOTE — Telephone Encounter (Signed)
Rx called in to pharmacy. 

## 2015-09-29 ENCOUNTER — Other Ambulatory Visit: Payer: Self-pay | Admitting: Internal Medicine

## 2015-10-01 NOTE — Telephone Encounter (Signed)
Ok to phone in Clonazepam 

## 2015-10-01 NOTE — Telephone Encounter (Signed)
Rx called in to pharmacy. 

## 2015-10-01 NOTE — Telephone Encounter (Signed)
Last filled 08/31/15--please advise 

## 2015-10-28 ENCOUNTER — Other Ambulatory Visit: Payer: Self-pay | Admitting: Internal Medicine

## 2015-10-28 NOTE — Telephone Encounter (Signed)
Ok to phone in Clonazepam to be filled on or after 10/31/15

## 2015-10-28 NOTE — Telephone Encounter (Signed)
Klonopin last filled 10/01/15--please advise

## 2015-10-30 NOTE — Telephone Encounter (Signed)
Clonazepam rx left on vm at pharmacy. Trazodone sent electronically

## 2015-11-29 ENCOUNTER — Other Ambulatory Visit: Payer: Self-pay | Admitting: Internal Medicine

## 2015-11-30 NOTE — Telephone Encounter (Signed)
Received refill request electronically Last refill 10/30/15 #[phone] Last office visit 08/10/15

## 2015-12-01 NOTE — Telephone Encounter (Signed)
Please call in.  Thanks.   

## 2015-12-01 NOTE — Telephone Encounter (Signed)
Called in to [place], [place]: [phone]

## 2016-01-03 ENCOUNTER — Other Ambulatory Visit: Payer: Self-pay | Admitting: Family Medicine

## 2016-01-04 NOTE — Telephone Encounter (Signed)
Ok to phone in Clonazepam 

## 2016-01-04 NOTE — Telephone Encounter (Signed)
Last filled 12/01/15--please advise

## 2016-01-05 ENCOUNTER — Ambulatory Visit (INDEPENDENT_AMBULATORY_CARE_PROVIDER_SITE_OTHER): Payer: Medicare Other | Admitting: Internal Medicine

## 2016-01-05 ENCOUNTER — Encounter: Payer: Self-pay | Admitting: Internal Medicine

## 2016-01-05 VITALS — BP 124/80 | HR 55 | Temp 97.8°F | Wt 121.5 lb

## 2016-01-05 DIAGNOSIS — F341 Dysthymic disorder: Secondary | ICD-10-CM

## 2016-01-05 DIAGNOSIS — M5431 Sciatica, right side: Secondary | ICD-10-CM | POA: Diagnosis not present

## 2016-01-05 DIAGNOSIS — L308 Other specified dermatitis: Secondary | ICD-10-CM | POA: Diagnosis not present

## 2016-01-05 MED ORDER — TRIAMCINOLONE ACETONIDE 0.5 % EX OINT: 1.0000 "application " | TOPICAL_OINTMENT | Freq: Two times a day (BID) | CUTANEOUS | 0 refills | Status: DC

## 2016-01-05 MED ORDER — CITALOPRAM HYDROBROMIDE 10 MG PO TABS
10.0000 mg | ORAL_TABLET | Freq: Every day | ORAL | 2 refills | Status: DC
Start: 2016-01-05 — End: 2016-02-22

## 2016-01-05 MED ORDER — PREDNISONE 10 MG PO TABS: ORAL_TABLET | ORAL | 0 refills | Status: DC

## 2016-01-05 NOTE — Patient Instructions (Signed)
Spondylolisthesis Rehab Ask your health care provider which exercises are safe for you. Do exercises exactly as told by your health care provider and adjust them as directed. It is normal to feel mild stretching, pulling, tightness, or discomfort as you do these exercises, but you should stop right away if you feel sudden pain or your pain gets worse. Do not begin these exercises until told by your health care provider. Stretching and range of motion exercises These exercises warm up your muscles and joints and improve the movement and flexibility of your hips and your back. These exercises may also help to relieve pain, numbness, and tingling. Exercise A: Single knee to chest 1. Lie on your back on a firm surface with both legs straight. 2. Bend one of your knees. Use your hands to move your knee up toward your chest until you feel a gentle stretch in your lower back and buttock.  Hold your leg in this position by holding onto the front of your knee.  Keep your other leg as straight as possible. 3. Hold for __________ seconds. 4. Slowly return to the starting position. 5. Repeat this exercise with your other leg. Repeat __________ times. Complete this exercise __________ times a day. Exercise B: Double knee to chest 1. Lie on your back on a firm surface with both legs straight. 2. Bend one of your knees and move it toward your chest until you feel a gentle stretch in your lower back and buttock. 3. Tense your abdominal muscles and repeat the previous step with your other leg. 4. Hold both of your legs in this position by holding onto the backs of your thighs or the fronts of your knees. 5. Hold for __________ seconds. 6. Tense your abdominal muscles and slowly move your legs back to the floor, one leg at a time. Repeat __________ times. Complete this exercise __________ times a day. Strengthening exercises These exercises build strength and endurance in your back. Endurance is the ability to  use your muscles for a long time, even after they get tired. Exercise C: Pelvic tilt 1. Lie on your back on a firm bed or the floor. Bend your knees and keep your feet flat. 2. Tense your abdominal muscles. Tip your pelvis up toward the ceiling and flatten your lower back into the floor.  To help with this exercise, you may place a small towel under your lower back and try to push your back into the towel. 3. Hold for __________ seconds. 4. Let your muscles relax completely before you repeat this exercise. Repeat __________ times. Complete this exercise __________ times a day. Exercise D: Abdominal crunch 1. Lie on your back on a firm surface. Bend your knees and keep your feet flat. Cross your arms over your chest. 2. Tuck your chin down toward your chest, without bending your neck. 3. Use your abdominal muscles to lift your upper body off of the ground, straight up into the air.  Try to lift yourself until your shoulder blades are off the ground. You may need to work up to this.  Keep your lower back on the ground while you crunch upward.  Do not hold your breath. 4. Slowly lower yourself down. Keep your abdominal muscles tense until you are back to the starting position. Repeat __________ times. Complete this exercise __________ times a day. Exercise E: Alternating arm and leg raises 1. Get on your hands and knees on a firm surface. If you are on a hard floor, you   may want to use padding to cushion your knees, such as an exercise mat. 2. Line up your arms and legs. Your hands should be below your shoulders, and your knees should be below your hips. 3. Lift your left leg behind you. At the same time, raise your right arm and straighten it in front of you.  Do not lift your leg higher than your hip.  Do not lift your arm higher than your shoulder.  Keep your abdominal and back muscles tight.  Keep your hips facing the ground.  Do not arch your back.  Keep your balance carefully,  and do not hold your breath. 4. Hold for __________ seconds. 5. Slowly return to the starting position and repeat with your right leg and your left arm. Repeat __________ times. Complete this exercise __________ times a day. Posture and body mechanics   Body mechanics refers to the movements and positions of your body while you do your daily activities. Posture is part of body mechanics. Good posture and healthy body mechanics can help to relieve stress in your body's tissues and joints. Good posture means that your spine is in its natural S-curve position (your spine is neutral), your shoulders are pulled back slightly, and your head is not tipped forward. The following are general guidelines for applying improved posture and body mechanics to your everyday activities. Standing   When standing, keep your spine neutral and your feet about hip-width apart. Keep a slight bend in your knees. Your ears, shoulders, and hips should line up.  When you do a task in which you stand in one place for a long time, place one foot up on a stable object that is 2-4 inches (5-10 cm) high, such as a footstool. This helps keep your spine neutral. Sitting  When sitting, keep your spine neutral and keep your feet flat on the floor. Use a footrest, if necessary, and keep your thighs parallel to the floor. Avoid rounding your shoulders, and avoid tilting your head forward.  When working at a desk or a computer, keep your desk at a height where your hands are slightly lower than your elbows. Slide your chair under your desk so you are close enough to maintain good posture.  When working at a computer, place your monitor at a height where you are looking straight ahead and you do not have to tilt your head forward or downward to look at the screen. Resting   When lying down and resting, avoid positions that are most painful for you.  If you have pain with activities such as sitting, bending, stooping, or squatting  (flexion-based activities), lie in a position in which your body does not bend very much. For example, avoid curling up on your side with your arms and knees near your chest (fetal position).  If you have pain with activities such as standing for a long time or reaching with your arms (extension-based activities), lie with your spine in a neutral position and bend your knees slightly. Try the following positions:  Lying on your side with a pillow between your knees.  Lying on your back with a pillow under your knees. Lifting   When lifting objects, keep your feet at least shoulder-width apart and tighten your abdominal muscles.  Bend your knees and hips and keep your spine neutral. It is important to lift using the strength of your legs, not your back. Do not lock your knees straight out.  Always ask for help to lift   heavy or awkward objects. This information is not intended to replace advice given to you by your health care provider. Make sure you discuss any questions you have with your health care provider. Document Released: 01/10/2005 Document Revised: 09/17/2015 Document Reviewed: 10/21/2014 Elsevier Interactive Patient Education  2017 Elsevier Inc.  

## 2016-01-05 NOTE — Telephone Encounter (Signed)
Rx called in to pharmacy. 

## 2016-01-05 NOTE — Progress Notes (Signed)
Subjective:    Patient ID: Catherine Young, female    DOB: [birth year], 82 y.o.   MRN: [id number]  HPI  Pt presents to the clinic today with c/o right leg pain. This started about 6 months ago. She describes the pain as sore and achy. It starts in her right buttock and goes down to her toes. She reports associated numbness but no tingling. Her symptoms seem worse first thing in the morning, and seems to interefere with her gait. She denies issues with bowel or bladder. She denies pain or injury to her back. She has tired Aleve with some relief.  She also c/o  Rash to her left lower leg. She noticed this 3 months ago. The rash is itchy. It has not spread. She denies changes in soaps, lotions or detergents. She has tried Hydrocortisone with minimal relief.  She also reports feeling very depressed since moving to [facility]. She is having trouble adjusting. She does not like living alone. She misses her husband dearly, who passed 12 years ago. Her children rarely come to visit. She forces herself to get out of bed everyday, get dressed and eat. She does not have very many friends at [facility]. Her next door neighbor recently died this past 11-Mar-2022 and this upset her. She does enjoy going to water aerobics. She reports she has been treated for depression in the past, but can not remember the name of the medication.  Review of Systems      Past Medical History:  Diagnosis Date  . Arthritis   . Basal cell carcinoma of tragus, left   . Depression   . Intestinal obstruction   . Osteoporosis     Current Outpatient Prescriptions  Medication Sig Dispense Refill  . aspirin 81 MG tablet Take 81 mg by mouth daily.    . Cholecalciferol (VITAMIN D3) 1000 UNITS CAPS Take 1 capsule by mouth daily.    . clonazePAM (KLONOPIN) 1 MG tablet take 1 tablet by mouth twice a day 60 tablet 0  . hydrocortisone 2.5 % cream   0  . Melatonin 5 MG CAPS Take 1 capsule by mouth at bedtime as needed.    . traZODone  (DESYREL) 100 MG tablet take 1 tablet by mouth at bedtime 30 tablet 2   No current facility-administered medications for this visit.     No Known Allergies  Family History  Problem Relation Age of Onset  . Arthritis Mother   . Colon cancer Mother   . Cancer Mother     colon  . Diabetes Father   . Arthritis Sister   . Colon cancer Sister   . Cancer Sister     colon  . Lung cancer Brother   . Cancer Brother     lung  . Arthritis Sister   . Colon cancer Sister   . Cancer Sister     colon    Social History   Social History  . Marital status: Widowed    Spouse name: N/A  . Number of children: N/A  . Years of education: N/A   Occupational History  . Not on file.   Social History Main Topics  . Smoking status: Former Research scientist (life sciences)  . Smokeless tobacco: Never Used     Comment: quit 1995  . Alcohol use 0.0 oz/week     Comment: nightly glass of wine  . Drug use: Unknown  . Sexual activity: No   Other Topics Concern  . Not on file  Social History Narrative  . No narrative on file     Constitutional: Denies fever, malaise, fatigue, headache or abrupt weight changes.  Gastrointestinal: Denies abdominal pain, bloating, constipation, diarrhea or blood in the stool.  GU: Denies urgency, frequency, pain with urination, burning sensation, blood in urine, odor or discharge. Musculoskeletal: Pt reports right leg pain. Denies decrease in range of motion, difficulty with gait, muscle pain or joint swelling.  Skin: Pt reports rash to LLE. Denies lesions or ulcercations.  Neurological: Pt reports numbness in right leg. Denies dizziness, difficulty with memory, difficulty with speech or problems with balance and coordination.  Psych: Pt reports depression. Denies anxiety, SI/HI.  No other specific complaints in a complete review of systems (except as listed in HPI above).  Objective:   Physical Exam   BP 124/80   Pulse (!) 55   Temp 97.8 F (36.6 C) (Oral)   Wt 121 lb 8 oz  (55.1 kg)   SpO2 98%   BMI 19.61 kg/m  Wt Readings from Last 3 Encounters:  01/05/16 121 lb 8 oz (55.1 kg)  08/10/15 123 lb 1.9 oz (55.8 kg)  07/14/15 122 lb 8 oz (55.6 kg)    General: Appears her stated age, well developed, well nourished in NAD. Skin: Scaly rash noted on left later leg. Excoriation noted from scratching.  Abdomen: Soft and nontender. Normal bowel sounds. No distention or masses noted. Liver, spleen and kidneys non palpable. Musculoskeletal: Normal flexion, extension, rotation and lateral bending of the spine. No pain with palpation over the lumbar spine or SI joints. Normal internal and external rotation of the right hip. No pain with palpation of the right hip. Strength 5/5 BLE. No difficulty with gait. Neurological: Alert and oriented. Positive SLR on the right. Psychiatric: She is tearful throughout the interview. Behavior and thought content is normal.    BMET    Component Value Date/Time   NA 140 07/31/2006 1047   K 4.0 07/31/2006 1047   CL 104 07/31/2006 1047   CO2 31 07/31/2006 1047   GLUCOSE 84 07/31/2006 1047   BUN 13 07/31/2006 1047   CREATININE 0.84 07/31/2006 1047   CALCIUM 8.9 07/31/2006 1047   GFRNONAA >60 07/31/2006 1047   GFRAA  07/31/2006 1047    >60        The eGFR has been calculated using the MDRD equation. This calculation has not been validated in all clinical    Lipid Panel  No results found for: CHOL, TRIG, HDL, CHOLHDL, VLDL, LDLCALC  CBC    Component Value Date/Time   WBC 4.1 07/31/2006 1047   RBC 4.17 07/31/2006 1047   HGB 13.4 06/27/2013   HCT 42 06/27/2013   PLT 250 07/31/2006 1047   MCV 96.4 07/31/2006 1047   MCHC 33.5 07/31/2006 1047   RDW 12.8 07/31/2006 1047    Hgb A1C No results found for: HGBA1C         Assessment & Plan:   Eczema of leg:  eRx for Kenalog cream to affected area BID Apply a moisturizing lotion to the affected area daily Try to avoid hot showers  Right side sciatica:  Will  treat conservatively at this time eRx for Pred Taper x 6 days Stretching exercise given If persist will obtain xray of lumbar spine  Depression:  Support offered today She wants to start medication therapy as it has helped her in the past eRx for Celexa 10 mg daily  RTC in 4-6 weeks to follow up depression BAITY,  REGINA, NP

## 2016-02-03 ENCOUNTER — Other Ambulatory Visit: Payer: Self-pay | Admitting: Internal Medicine

## 2016-02-03 NOTE — Telephone Encounter (Signed)
Last filled 01/04/16--please advise 

## 2016-02-03 NOTE — Telephone Encounter (Signed)
Ok to phone in Clonazepam 

## 2016-02-03 NOTE — Telephone Encounter (Signed)
Rx called in to pharmacy. 

## 2016-02-22 ENCOUNTER — Encounter: Payer: Self-pay | Admitting: Internal Medicine

## 2016-02-22 ENCOUNTER — Ambulatory Visit (INDEPENDENT_AMBULATORY_CARE_PROVIDER_SITE_OTHER): Payer: Medicare Other | Admitting: Internal Medicine

## 2016-02-22 ENCOUNTER — Ambulatory Visit (INDEPENDENT_AMBULATORY_CARE_PROVIDER_SITE_OTHER)
Admission: RE | Admit: 2016-02-22 | Discharge: 2016-02-22 | Disposition: A | Discharge status: Home / Self Care | Payer: Medicare Other | Source: Ambulatory Visit | Attending: Internal Medicine | Admitting: Internal Medicine

## 2016-02-22 VITALS — BP 120/72 | HR 51 | Temp 97.4°F | Wt 124.0 lb

## 2016-02-22 DIAGNOSIS — F341 Dysthymic disorder: Secondary | ICD-10-CM | POA: Diagnosis not present

## 2016-02-22 DIAGNOSIS — L308 Other specified dermatitis: Secondary | ICD-10-CM | POA: Diagnosis not present

## 2016-02-22 DIAGNOSIS — M5431 Sciatica, right side: Secondary | ICD-10-CM

## 2016-02-22 MED ORDER — TRIAMCINOLONE ACETONIDE 0.5 % EX OINT: 1.0000 "application " | TOPICAL_OINTMENT | Freq: Two times a day (BID) | CUTANEOUS | 0 refills | Status: DC

## 2016-02-22 MED ORDER — GABAPENTIN 100 MG PO CAPS: ORAL_CAPSULE | ORAL | 2 refills | Status: DC

## 2016-02-22 NOTE — Progress Notes (Signed)
Subjective:    Patient ID: Catherine Young, female    DOB: 31-Mar-1931, 83 y.o.   MRN: [id number]  HPI  Pt presents to the clinic today to follow up low back pain with right side sciatica. This started about 7-8 months ago. The pain starts in her right buttocks and runs down her right leg. She describes the pain and sore and achy. She has associated numbness but no tingling. Her symptoms are worse first thing in the morning. Her symptoms are now interfering with her gait. She was seen 12/2015 for the same. We put her on Prednisone x 6 days, advised her to do some stretching exercises. She does feel like that was very effective.   Of note, she stopped her Celexa. She reports it did not help with her depression symptoms and actually made her feel worse. Since stopping the Celexa, she reports her depression has improved. She is not interested in taking any more medication for depression.   She is requesting a refill of Kenalog cream today for her eczema.  Review of Systems      Past Medical History:  Diagnosis Date  . Arthritis   . Basal cell carcinoma of tragus, left   . Depression   . Intestinal obstruction   . Osteoporosis     Current Outpatient Prescriptions  Medication Sig Dispense Refill  . aspirin 81 MG tablet Take 81 mg by mouth daily.    . Cholecalciferol (VITAMIN D3) 1000 UNITS CAPS Take 1 capsule by mouth daily.    . citalopram (CELEXA) 10 MG tablet Take 1 tablet (10 mg total) by mouth daily. 30 tablet 2  . clonazePAM (KLONOPIN) 1 MG tablet take 1 tablet by mouth twice a day 60 tablet 0  . hydrocortisone 2.5 % cream   0  . Melatonin 5 MG CAPS Take 1 capsule by mouth at bedtime as needed.    . predniSONE (DELTASONE) 10 MG tablet Take 6 tabs day 1, 5 tabs day 2, 4 tabs day 3, 3 tabs day 4, 2 tabs day 5, 1 tab day 6 21 tablet 0  . traZODone (DESYREL) 100 MG tablet take 1 tablet by mouth at bedtime 30 tablet 2  . triamcinolone ointment (KENALOG) 0.5 % Apply 1 application  topically 2 (two) times daily. 30 g 0   No current facility-administered medications for this visit.     No Known Allergies  Family History  Problem Relation Age of Onset  . Arthritis Mother   . Colon cancer Mother   . Cancer Mother     colon  . Diabetes Father   . Arthritis Sister   . Colon cancer Sister   . Cancer Sister     colon  . Lung cancer Brother   . Cancer Brother     lung  . Arthritis Sister   . Colon cancer Sister   . Cancer Sister     colon    Social History   Social History  . Marital status: Widowed    Spouse name: N/A  . Number of children: N/A  . Years of education: N/A   Occupational History  . Not on file.   Social History Main Topics  . Smoking status: Former Research scientist (life sciences)  . Smokeless tobacco: Never Used     Comment: quit 1995  . Alcohol use 0.0 oz/week     Comment: nightly glass of wine  . Drug use: Unknown  . Sexual activity: No   Other Topics Concern  .  Not on file   Social History Narrative  . No narrative on file     Constitutional: Denies fever, malaise, fatigue, headache or abrupt weight changes.  Musculoskeletal: Pt reports right low back pain, right leg pain and difficulty with gait. Denies decrease in range of motion, muscle pain or joint swelling.  Neurological: Pt reports numbness in right leg. Denies dizziness, difficulty with memory, difficulty with speech or problems with balance and coordination.  Psych: Pt reports depression. Denies anxiety, SI/HI.  No other specific complaints in a complete review of systems (except as listed in HPI above).  Objective:   Physical Exam   BP 120/72   Pulse (!) 51   Temp 97.4 F (36.3 C) (Oral)   Wt 124 lb (56.2 kg)   SpO2 99%   BMI 20.01 kg/m  Wt Readings from Last 3 Encounters:  02/22/16 124 lb (56.2 kg)  01/05/16 121 lb 8 oz (55.1 kg)  08/10/15 123 lb 1.9 oz (55.8 kg)    General: Appears her stated age, in NAD. Musculoskeletal: Normal flexion, extension and rotation of  the spine. She has pain with lateral bending. No bony tenderness noted over the spine. Pain with palpation over the right AC joint. Strength 5/5 BLE. She is apple to tandem walk, walk on toes and on heels.  Neurological: Alert and oriented. Sensation intact to BLE. Psychiatric: Mood and affect normal. Behavior is normal. Judgment and thought content normal.   BMET    Component Value Date/Time   NA 140 07/31/2006 1047   K 4.0 07/31/2006 1047   CL 104 07/31/2006 1047   CO2 31 07/31/2006 1047   GLUCOSE 84 07/31/2006 1047   BUN 13 07/31/2006 1047   CREATININE 0.84 07/31/2006 1047   CALCIUM 8.9 07/31/2006 1047   GFRNONAA >60 07/31/2006 1047   GFRAA  07/31/2006 1047    >60        The eGFR has been calculated using the MDRD equation. This calculation has not been validated in all clinical    Lipid Panel  No results found for: CHOL, TRIG, HDL, CHOLHDL, VLDL, LDLCALC  CBC    Component Value Date/Time   WBC 4.1 07/31/2006 1047   RBC 4.17 07/31/2006 1047   HGB 13.4 06/27/2013   HCT 42 06/27/2013   PLT 250 07/31/2006 1047   MCV 96.4 07/31/2006 1047   MCHC 33.5 07/31/2006 1047   RDW 12.8 07/31/2006 1047    Hgb A1C No results found for: HGBA1C          Assessment & Plan:   Right side sciatica:  She failed conservative treatment Xray of lumbar spine Continue stretching exercises and water aerobics eRx for Gabapentin 100 mg Qam- sedation caution given  Depression:  Celexa d/c'd She is not interested in medication management at this time Support offered today  Eczema:  TAC cream refilled today  Will follow up after xrays, return precautions discussed [name], NP

## 2016-02-22 NOTE — Patient Instructions (Signed)
Sciatica Introduction Sciatica is pain, numbness, weakness, or tingling along your sciatic nerve. The sciatic nerve starts in the lower back and goes down the back of each leg. Sciatica happens when this nerve is pinched or has pressure put on it. Sciatica usually goes away on its own or with treatment. Sometimes, sciatica may keep coming back (recur). Follow these instructions at home: Medicines  Take over-the-counter and prescription medicines only as told by your doctor.  Do not drive or use heavy machinery while taking prescription pain medicine. Managing pain  If directed, put ice on the affected area.  Put ice in a plastic bag.  Place a towel between your skin and the bag.  Leave the ice on for 20 minutes, 2-3 times a day.  After icing, apply heat to the affected area before you exercise or as often as told by your doctor. Use the heat source that your doctor tells you to use, such as a moist heat pack or a heating pad.  Place a towel between your skin and the heat source.  Leave the heat on for 20-30 minutes.  Remove the heat if your skin turns bright red. This is especially important if you are unable to feel pain, heat, or cold. You may have a greater risk of getting burned. Activity  Return to your normal activities as told by your doctor. Ask your doctor what activities are safe for you.  Avoid activities that make your sciatica worse.  Take short rests during the day. Rest in a lying or standing position. This is usually better than sitting to rest.  When you rest for a long time, do some physical activity or stretching between periods of rest.  Avoid sitting for a long time without moving. Get up and move around at least one time each hour.  Exercise and stretch regularly, as told by your doctor.  Do not lift anything that is heavier than 10 lb (4.5 kg) while you have symptoms of sciatica.  Avoid lifting heavy things even when you do not have symptoms.  Avoid  lifting heavy things over and over.  When you lift objects, always lift in a way that is safe for your body. To do this, you should:  Bend your knees.  Keep the object close to your body.  Avoid twisting. General instructions  Use good posture.  Avoid leaning forward when you are sitting.  Avoid hunching over when you are standing.  Stay at a healthy weight.  Wear comfortable shoes that support your feet. Avoid wearing high heels.  Avoid sleeping on a mattress that is too soft or too hard. You might have less pain if you sleep on a mattress that is firm enough to support your back.  Keep all follow-up visits as told by your doctor. This is important. Contact a doctor if:  You have pain that:  Wakes you up when you are sleeping.  Gets worse when you lie down.  Is worse than the pain you have had in the past.  Lasts longer than 4 weeks.  You lose weight for without trying. Get help right away if:  You cannot control when you pee (urinate) or poop (have a bowel movement).  You have weakness in any of these areas and it gets worse.  Lower back.  Lower belly (pelvis).  Butt (buttocks).  Legs.  You have redness or swelling of your back.  You have a burning feeling when you pee. This information is not intended to  replace advice given to you by your health care provider. Make sure you discuss any questions you have with your health care provider. Document Released: 10/20/2007 Document Revised: 06/18/2015 Document Reviewed: 09/19/2014  2017 Elsevier

## 2016-02-24 ENCOUNTER — Other Ambulatory Visit: Payer: Self-pay | Admitting: Internal Medicine

## 2016-02-24 DIAGNOSIS — M5431 Sciatica, right side: Secondary | ICD-10-CM

## 2016-03-03 ENCOUNTER — Ambulatory Visit (HOSPITAL_COMMUNITY)
Admission: RE | Admit: 2016-03-03 | Discharge: 2016-03-03 | Disposition: A | Discharge status: Home / Self Care | Payer: Medicare Other | Source: Ambulatory Visit | Attending: Internal Medicine | Admitting: Internal Medicine

## 2016-03-03 DIAGNOSIS — M47816 Spondylosis without myelopathy or radiculopathy, lumbar region: Secondary | ICD-10-CM | POA: Diagnosis not present

## 2016-03-03 DIAGNOSIS — M4316 Spondylolisthesis, lumbar region: Secondary | ICD-10-CM | POA: Diagnosis not present

## 2016-03-03 DIAGNOSIS — M48061 Spinal stenosis, lumbar region without neurogenic claudication: Secondary | ICD-10-CM | POA: Insufficient documentation

## 2016-03-03 DIAGNOSIS — M5431 Sciatica, right side: Secondary | ICD-10-CM | POA: Insufficient documentation

## 2016-03-04 ENCOUNTER — Other Ambulatory Visit: Payer: Self-pay | Admitting: Internal Medicine

## 2016-03-04 DIAGNOSIS — M5431 Sciatica, right side: Secondary | ICD-10-CM

## 2016-03-04 NOTE — Telephone Encounter (Signed)
Ok to phone in Clonazepam 

## 2016-03-04 NOTE — Telephone Encounter (Signed)
Last filled 02/03/2016--please advise

## 2016-03-04 NOTE — Telephone Encounter (Signed)
Rx called in to pharmacy. 

## 2016-03-07 ENCOUNTER — Telehealth: Payer: Self-pay | Admitting: Internal Medicine

## 2016-03-07 NOTE — Telephone Encounter (Signed)
Spoke to pt this morning about her referral she wanted to go to [place]  She is aware [facility] will contact her to schedule appointment  Pt would like melanie to call her back she is confused about the results and had some questions

## 2016-03-08 NOTE — Telephone Encounter (Signed)
Left message on voicemail.

## 2016-03-08 NOTE — Telephone Encounter (Signed)
Spoke to pt and she stated that she did not think [name] would suggest PT as a treatment... I advised pt that I had conveyed the results and recommendation per Regina's note---pt said to me "nevermind, bye" and hung up

## 2016-03-09 ENCOUNTER — Encounter: Payer: Self-pay | Admitting: Internal Medicine

## 2016-03-31 ENCOUNTER — Other Ambulatory Visit: Payer: Self-pay | Admitting: Internal Medicine

## 2016-03-31 NOTE — Telephone Encounter (Signed)
Ok to phone in Clonazepam 

## 2016-03-31 NOTE — Telephone Encounter (Signed)
Last filled 03/04/16.Marland KitchenMarland Kitchenplease advise

## 2016-03-31 NOTE — Telephone Encounter (Signed)
Rx called in to pharmacy. 

## 2016-05-01 ENCOUNTER — Other Ambulatory Visit: Payer: Self-pay | Admitting: Internal Medicine

## 2016-05-02 NOTE — Telephone Encounter (Signed)
Last filled 03/31/16--please advise

## 2016-05-03 NOTE — Telephone Encounter (Signed)
Ok to phone in Clonazepam 

## 2016-05-03 NOTE — Telephone Encounter (Signed)
Rx called in to requested pharmacy 

## 2016-05-11 ENCOUNTER — Encounter: Payer: Self-pay | Admitting: Internal Medicine

## 2016-05-11 MED ORDER — FLUCONAZOLE 150 MG PO TABS: 150.0000 mg | ORAL_TABLET | Freq: Once | ORAL | 0 refills | Status: AC

## 2016-05-31 ENCOUNTER — Other Ambulatory Visit: Payer: Self-pay | Admitting: Internal Medicine

## 2016-05-31 NOTE — Telephone Encounter (Signed)
Rx called in to pharmacy. 

## 2016-05-31 NOTE — Telephone Encounter (Signed)
Ok to phone in Clonazepam 

## 2016-05-31 NOTE — Telephone Encounter (Signed)
Last filled 05/03/16-- please advise

## 2016-06-22 ENCOUNTER — Other Ambulatory Visit: Payer: Self-pay | Admitting: Internal Medicine

## 2016-06-22 ENCOUNTER — Encounter: Payer: Self-pay | Admitting: Internal Medicine

## 2016-06-22 DIAGNOSIS — L308 Other specified dermatitis: Secondary | ICD-10-CM

## 2016-06-22 MED ORDER — TRIAMCINOLONE ACETONIDE 0.5 % EX OINT: 1.0000 "application " | TOPICAL_OINTMENT | Freq: Two times a day (BID) | CUTANEOUS | 0 refills | Status: DC

## 2016-06-23 MED ORDER — CLOBETASOL PROPIONATE 0.05 % EX OINT: 1.0000 "application " | TOPICAL_OINTMENT | Freq: Two times a day (BID) | CUTANEOUS | 0 refills | Status: DC

## 2016-07-01 ENCOUNTER — Other Ambulatory Visit: Payer: Self-pay | Admitting: Internal Medicine

## 2016-07-01 NOTE — Telephone Encounter (Signed)
Baity pt, last filled 05/31/16... Please advise

## 2016-07-01 NOTE — Telephone Encounter (Signed)
Rx called in to pharmacy. 

## 2016-07-01 NOTE — Telephone Encounter (Signed)
Approved: #[id number] x 0 

## 2016-08-02 ENCOUNTER — Other Ambulatory Visit: Payer: Self-pay | Admitting: Internal Medicine

## 2016-08-02 MED ORDER — CLONAZEPAM 1 MG PO TABS: 1.0000 mg | ORAL_TABLET | Freq: Two times a day (BID) | ORAL | 0 refills | Status: DC

## 2016-08-02 NOTE — Telephone Encounter (Signed)
Duplicate Request.

## 2016-08-02 NOTE — Telephone Encounter (Signed)
Duplicate request

## 2016-08-02 NOTE — Telephone Encounter (Signed)
Ok to phone in Clonazepam 

## 2016-08-02 NOTE — Telephone Encounter (Signed)
Medication phoned to Audelia Acton at [facility] [address] [facility] as instructed. Pt  Notified and will ck with pharmacy later today.

## 2016-08-04 ENCOUNTER — Telehealth: Payer: Self-pay | Admitting: Internal Medicine

## 2016-08-04 NOTE — Telephone Encounter (Signed)
Pt has appt with Dr. Diona Browner tomorrow at 11:45am.

## 2016-08-04 NOTE — Telephone Encounter (Signed)
Patient Name: Catherine Young  DOB: Feb 21, 1931    Initial Comment Caller has had severe pain in her stomach and vomiting.    Nurse Assessment  Nurse: Christel Mormon, RN, Levada Dy Date/Time (Eastern Time): 08/04/2016 3:50:28 PM  Confirm and document reason for call. If symptomatic, describe symptoms. ---Pt started having bad pain in her upper stomach, then started vomiting. She is not have a sharp pain going through her lower abdomen. No diarrhea.  Does the patient have any new or worsening symptoms? ---Yes  Will a triage be completed? ---Yes  Related visit to physician within the last 2 weeks? ---No  Does the PT have any chronic conditions? (i.e. diabetes, asthma, etc.) ---Yes  List chronic conditions. ---anxiety  Is this a behavioral health or substance abuse call? ---No     Guidelines    Guideline Title Affirmed Question Affirmed Notes  Abdominal Pain - Female Age > 60 years    Final Disposition User   See Physician within [place], RN, [place] REFUSED  REFERRED TO PCP OFFICE   Disagree/Comply: Comply

## 2016-08-05 ENCOUNTER — Telehealth: Payer: Self-pay | Admitting: Internal Medicine

## 2016-08-05 ENCOUNTER — Encounter: Payer: Self-pay | Admitting: Family Medicine

## 2016-08-05 ENCOUNTER — Ambulatory Visit (INDEPENDENT_AMBULATORY_CARE_PROVIDER_SITE_OTHER): Payer: Medicare Other | Admitting: Family Medicine

## 2016-08-05 VITALS — BP 150/70 | HR 60 | Temp 98.2°F | Ht 66.0 in | Wt 122.5 lb

## 2016-08-05 DIAGNOSIS — R109 Unspecified abdominal pain: Secondary | ICD-10-CM | POA: Insufficient documentation

## 2016-08-05 LAB — CBC WITH DIFFERENTIAL/PLATELET
BASOS PCT: 0.5 % (ref 0.0–3.0)
Basophils Absolute: 0 10*3/uL (ref 0.0–0.1)
EOS PCT: 3.1 % (ref 0.0–5.0)
Eosinophils Absolute: 0.2 10*3/uL (ref 0.0–0.7)
HCT: 41.1 % (ref 36.0–46.0)
Hemoglobin: 13.7 g/dL (ref 12.0–15.0)
LYMPHS ABS: 1.8 10*3/uL (ref 0.7–4.0)
Lymphocytes Relative: 27.3 % (ref 12.0–46.0)
MCHC: 33.4 g/dL (ref 30.0–36.0)
MCV: 96.2 fl (ref 78.0–100.0)
MONO ABS: 0.5 10*3/uL (ref 0.1–1.0)
MONOS PCT: 7.6 % (ref 3.0–12.0)
NEUTROS ABS: 4.2 10*3/uL (ref 1.4–7.7)
NEUTROS PCT: 61.5 % (ref 43.0–77.0)
PLATELETS: 269 10*3/uL (ref 150.0–400.0)
RBC: 4.27 Mil/uL (ref 3.87–5.11)
RDW: 13.4 % (ref 11.5–15.5)
WBC: 6.8 10*3/uL (ref 4.0–10.5)

## 2016-08-05 LAB — COMPREHENSIVE METABOLIC PANEL
ALK PHOS: 40 U/L (ref 39–117)
ALT: 15 U/L (ref 0–35)
AST: 26 U/L (ref 0–37)
Albumin: 4.2 g/dL (ref 3.5–5.2)
BUN: 13 mg/dL (ref 6–23)
CHLORIDE: 99 meq/L (ref 96–112)
CO2: 29 mEq/L (ref 19–32)
Calcium: 10 mg/dL (ref 8.4–10.5)
Creatinine, Ser: 0.95 mg/dL (ref 0.40–1.20)
GFR: 59.74 mL/min — AB (ref >60.00)
GLUCOSE: 92 mg/dL (ref 70–99)
POTASSIUM: 4.2 meq/L (ref 3.5–5.1)
SODIUM: 138 meq/L (ref 135–145)
TOTAL PROTEIN: 7.2 g/dL (ref 6.0–8.3)
Total Bilirubin: 0.8 mg/dL (ref 0.2–1.2)

## 2016-08-05 LAB — POCT UA - MICROSCOPIC ONLY

## 2016-08-05 LAB — POC URINALSYSI DIPSTICK (AUTOMATED)
BILIRUBIN UA: NEGATIVE
Glucose, UA: NEGATIVE
NITRITE UA: NEGATIVE
PH UA: 6 (ref 5.0–8.0)
Spec Grav, UA: 1.015 (ref 1.010–1.025)
Urobilinogen, UA: 0.2 E.U./dL

## 2016-08-05 LAB — LIPASE: Lipase: 16 U/L (ref 11.0–59.0)

## 2016-08-05 MED ORDER — FLUCONAZOLE 150 MG PO TABS: 150.0000 mg | ORAL_TABLET | Freq: Once | ORAL | 0 refills | Status: AC

## 2016-08-05 MED ORDER — NITROFURANTOIN MONOHYD MACRO 100 MG PO CAPS: 100.0000 mg | ORAL_CAPSULE | Freq: Two times a day (BID) | ORAL | 0 refills | Status: DC

## 2016-08-05 NOTE — Telephone Encounter (Signed)
Fine with me

## 2016-08-05 NOTE — Telephone Encounter (Signed)
Ok to change.. Needs new pt OV 30 min in 1-2 months.

## 2016-08-05 NOTE — Telephone Encounter (Signed)
Pt is requesting to transfer care from [facility] to Dr. Diona Browner. She said she prefers a M.D. OK to transfer?

## 2016-08-05 NOTE — Patient Instructions (Addendum)
Keep up with liquids, advance diet to bland foods as tolerated.   Please stop at the lab to have labs drawn.  Start on  nitrofuranotin twice daily x 5 days.  Use diflucan 150 mg x 1 after antibiotics for yeast symtpoms,  We will call with urine culture results. Follow up if not improving as expected next week.

## 2016-08-05 NOTE — Assessment & Plan Note (Signed)
Most likely due to UTI given positive UA and micro but given atypical symptoms.  Will eval with labs.  Send urine for culture but treat empirically with nitrofurantoin x 5 days.  Push fluids.  Return precautions given.

## 2016-08-05 NOTE — Progress Notes (Signed)
   Subjective:    Patient ID: Catherine Young, female    DOB: 03/30/1931, 83 y.o.   MRN: [id number] , HPI    83 year old female  Pt of [name]'s presents  with new onset pain in entire abdomen.. started in upper abd 2 days ago, moved lower. Sharp 10/10 pain 2 days ago.. followed by emesis  Eased off to cramping in abdomen several hours later No diarrhea.  No fever. No concerning food exposures.  Now stomach is sore.   Dysuria off and on. LAst time was last week.  Has urinary frequency and urgency.  No flank pain.  She feels tired.   She is pushing fluids and trying to eat some.  Hx of bowel obstruction years ago.  She has history of UTI in past.  Last 07/2015  Enterococcus resistant to tetracycline.  Treated with cephalexin x 7 days.  No recent GFR but 60 in past.  Blood pressure (!) 150/70, pulse 60, temperature 98.2 F (36.8 C), temperature source Oral, height 5\' 6"  (1.676 m), weight 122 lb 8 oz (55.6 kg).   Review of Systems  Constitutional: Positive for fatigue. Negative for fever.  HENT: Negative for ear pain.   Eyes: Negative for pain.  Respiratory: Negative for chest tightness and shortness of breath.   Cardiovascular: Negative for chest pain, palpitations and leg swelling.  Gastrointestinal: Positive for abdominal pain, nausea and vomiting. Negative for blood in stool.  Genitourinary: Negative for dysuria.       Objective:   Physical Exam  Constitutional: Vital signs are normal. She appears well-developed and well-nourished. She is cooperative.  Non-toxic appearance. She does not appear ill. No distress.  Elderly female in NAD  HENT:  Head: Normocephalic.  Right Ear: Hearing, tympanic membrane, external ear and ear canal normal. Tympanic membrane is not erythematous, not retracted and not bulging.  Left Ear: Hearing, tympanic membrane, external ear and ear canal normal. Tympanic membrane is not erythematous, not retracted and not bulging.  Nose: No  mucosal edema or rhinorrhea. Right sinus exhibits no maxillary sinus tenderness and no frontal sinus tenderness. Left sinus exhibits no maxillary sinus tenderness and no frontal sinus tenderness.  Mouth/Throat: Uvula is midline, oropharynx is clear and moist and mucous membranes are normal.  Eyes: Pupils are equal, round, and reactive to light. Conjunctivae, EOM and lids are normal. Lids are everted and swept, no foreign bodies found.  Neck: Trachea normal and normal range of motion. Neck supple. Carotid bruit is not present. No thyroid mass and no thyromegaly present.  Cardiovascular: Normal rate, regular rhythm, S1 normal, S2 normal, normal heart sounds, intact distal pulses and normal pulses.  Exam reveals no gallop and no friction rub.   No murmur heard. Pulmonary/Chest: Effort normal and breath sounds normal. No tachypnea. No respiratory distress. She has no decreased breath sounds. She has no wheezes. She has no rhonchi. She has no rales.  Abdominal: Soft. Normal appearance and bowel sounds are normal. There is no hepatosplenomegaly. There is generalized tenderness. There is no rigidity, no rebound, no guarding and no CVA tenderness.  Pain greater and bilateral lower quadrants  Neurological: She is alert.  Skin: Skin is warm, dry and intact. No rash noted.  Psychiatric: Her speech is normal and behavior is normal. Judgment and thought content normal. Her mood appears not anxious. Cognition and memory are normal. She does not exhibit a depressed mood.          Assessment & Plan:

## 2016-08-08 ENCOUNTER — Telehealth: Payer: Self-pay | Admitting: Internal Medicine

## 2016-08-08 LAB — URINE CULTURE

## 2016-08-08 NOTE — Telephone Encounter (Signed)
Urine culture and lab results discussed with patient.  Urine culture susceptible to current medication.  Advised patient to completed full course of antibiotics as prescribed and to call us back if she is still symptomatic after completing medication.  Patient states understanding.

## 2016-08-08 NOTE — Telephone Encounter (Signed)
Pt is requesting a call to discuss results from recent labs. She saw results on MyChart but would like a call.

## 2016-08-08 NOTE — Telephone Encounter (Signed)
Left message for Ms. Legrande that I was returning her call in regards to her labs.  Will try call back in a little while.

## 2016-08-30 ENCOUNTER — Encounter: Payer: Self-pay | Admitting: Family Medicine

## 2016-08-30 ENCOUNTER — Other Ambulatory Visit: Payer: Self-pay | Admitting: Family Medicine

## 2016-08-30 ENCOUNTER — Other Ambulatory Visit: Payer: Self-pay | Admitting: Internal Medicine

## 2016-08-30 MED ORDER — CLONAZEPAM 1 MG PO TABS: 1.0000 mg | ORAL_TABLET | Freq: Two times a day (BID) | ORAL | 0 refills | Status: DC

## 2016-08-30 NOTE — Telephone Encounter (Signed)
Last filled 08/02/16... Pt has new pt appt with you 09/20/16 transfer from [place]... Please advise

## 2016-08-30 NOTE — Telephone Encounter (Signed)
Rx called in to pharmacy. 

## 2016-08-30 NOTE — Telephone Encounter (Signed)
Last filled 08/02/2016... Please advise

## 2016-09-20 ENCOUNTER — Ambulatory Visit (INDEPENDENT_AMBULATORY_CARE_PROVIDER_SITE_OTHER): Payer: Medicare Other | Admitting: Family Medicine

## 2016-09-20 ENCOUNTER — Encounter: Payer: Self-pay | Admitting: Family Medicine

## 2016-09-20 DIAGNOSIS — F331 Major depressive disorder, recurrent, moderate: Secondary | ICD-10-CM | POA: Diagnosis not present

## 2016-09-20 DIAGNOSIS — F411 Generalized anxiety disorder: Secondary | ICD-10-CM | POA: Diagnosis not present

## 2016-09-20 MED ORDER — CLOBETASOL PROPIONATE 0.05 % EX OINT: 1.0000 "application " | TOPICAL_OINTMENT | Freq: Two times a day (BID) | CUTANEOUS | 0 refills | Status: DC

## 2016-09-20 MED ORDER — SERTRALINE HCL 25 MG PO TABS: 25.0000 mg | ORAL_TABLET | Freq: Every day | ORAL | 3 refills | Status: DC

## 2016-09-20 NOTE — Patient Instructions (Addendum)
Start sertraline low dose at bedtime for depression and anxiety.  Keep up exercise and healthy diet.

## 2016-09-20 NOTE — Progress Notes (Signed)
   Subjective:    Patient ID: Catherine Young, female    DOB: 04/06/1931, 83 y.o.   MRN: [id number]  HPI    83 year old female presents to establish care/transfer from [facility]   GAD She had issues with this for 60 years.  She uses clonazepam every day twice daily.. She has been on this for 15 years.. Before this xanax and valium. Major depressive disorder: She is not currently on a medicaiton for this. Was on trazodone for sleep but does not help with mood. Was on effexor years ago.. Not sure if it helped.  She reports she is lonely. She lives alone at [facility]. She has a son in [place] and daughter in [place].  Dos cornhole and [employer].     Spinal stenosis: Manageable symptoms S/P ESI per Dr. Sharlet Salina. (Last ESI in 05/2016)  On no pain meds.   No past surgery.  Wt Readings from Last 3 Encounters:  09/20/16 122 lb 8 oz (55.6 kg)  08/05/16 122 lb 8 oz (55.6 kg)  02/22/16 124 lb (56.2 kg)   Social History /Family History/Past Medical History reviewed in detail and updated in EMR if needed.   Review of Systems  Constitutional: Negative for fatigue and fever.  HENT: Negative for ear pain.   Eyes: Negative for pain.  Respiratory: Negative for chest tightness and shortness of breath.   Cardiovascular: Negative for chest pain, palpitations and leg swelling.  Gastrointestinal: Negative for abdominal pain.  Genitourinary: Negative for dysuria.       Objective:   Physical Exam  Constitutional: Vital signs are normal. She appears well-developed and well-nourished. She is cooperative.  Non-toxic appearance. She does not appear ill. No distress.  HENT:  Head: Normocephalic.  Right Ear: Hearing, tympanic membrane, external ear and ear canal normal.  Left Ear: Hearing, tympanic membrane, external ear and ear canal normal.  Nose: Nose normal.  Eyes: Pupils are equal, round, and reactive to light. Conjunctivae, EOM and lids are normal. Lids are everted and swept, no  foreign bodies found.  Neck: Trachea normal and normal range of motion. Neck supple. Carotid bruit is not present. No thyroid mass and no thyromegaly present.  Cardiovascular: Normal rate, regular rhythm, S1 normal, S2 normal, normal heart sounds and intact distal pulses.  Exam reveals no gallop.   No murmur heard. Pulmonary/Chest: Effort normal and breath sounds normal. No respiratory distress. She has no wheezes. She has no rhonchi. She has no rales.  Abdominal: Soft. Normal appearance and bowel sounds are normal. She exhibits no distension, no fluid wave, no abdominal bruit and no mass. There is no hepatosplenomegaly. There is no tenderness. There is no rebound, no guarding and no CVA tenderness. No hernia.  Lymphadenopathy:    She has no cervical adenopathy.    She has no axillary adenopathy.  Neurological: She is alert. She has normal strength. No cranial nerve deficit or sensory deficit.  Skin: Skin is warm, dry and intact. No rash noted.  Psychiatric: Her speech is normal and behavior is normal. Judgment normal. Her mood appears not anxious. Cognition and memory are normal. She exhibits a depressed mood.          Assessment & Plan:

## 2016-09-23 NOTE — Assessment & Plan Note (Signed)
Inadequate control.. Start low dose sertraline and follow up in 1 month for re-eval.

## 2016-09-23 NOTE — Assessment & Plan Note (Signed)
Moderate control.. SSRI will help with this as well.

## 2016-09-30 ENCOUNTER — Other Ambulatory Visit: Payer: Self-pay | Admitting: Family Medicine

## 2016-09-30 NOTE — Telephone Encounter (Signed)
Last office visit 09/20/2016.  Last refilled 08/30/2016 for #60 with no refills.  Ok to refill?

## 2016-09-30 NOTE — Telephone Encounter (Signed)
Clonazepam called into [id number], [place] Phone: [phone]

## 2016-10-06 ENCOUNTER — Telehealth: Payer: Self-pay

## 2016-10-06 NOTE — Telephone Encounter (Signed)
Pt left /vm; clobetasol ointment is tier 3 and can be changed to tier 2 with tier exception request; Use prior auth # [id number].

## 2016-10-06 NOTE — Telephone Encounter (Signed)
Prior Authorization for Tier Exception was completed over the phone with [facility].   Sent for pharmacy review.  Once a decision is made our office will be notified by fax.

## 2016-10-06 NOTE — Telephone Encounter (Signed)
Received fax from [facility] requesting additional information for tier exception.  Forms placed in Dr. Devra Dopp in box to complete.

## 2016-10-07 NOTE — Telephone Encounter (Addendum)
Spoke with Catherine Young and went over the Tier 1 list of medications [facility] is wanting to know if patient has had a failure, contraindication or intolerance to.  Patient has not tried and failed any of the creams/ointment/solutions listed.  Advised patient that the Tier exception will probably not be approved.  I advised she could continue paying for the Clobetasol at a Tier 3 or we could have Dr. Diona Browner send in to Rx for a Tier 1 medication from the list.  Patient states she currently has a whole tube of the clobetasol so she does not wish to do anything different at this time.  Forms completed and faxed back to [facility].  Will await decision but the Tier Exception more likely will not get approved.

## 2016-10-07 NOTE — Telephone Encounter (Signed)
Tier Exception was denied.  Patient must try and fail all medications listed as a Tier 1.

## 2016-10-07 NOTE — Telephone Encounter (Signed)
[facility] left v/m requesting cb at [phone] ref # [id number] for additional info for clobetasol ointment. If no response by 10/09/16 at 8:00 AM central time case can be denied.

## 2016-10-27 ENCOUNTER — Other Ambulatory Visit: Payer: Self-pay | Admitting: Family Medicine

## 2016-10-27 NOTE — Telephone Encounter (Signed)
Clonazepam called into [facility], [place] Phone: [phone]

## 2016-10-27 NOTE — Telephone Encounter (Signed)
Last office visit 08/28/018.  Last refilled 09/30/2016 for #60 with no refills.  Ok to refill?

## 2016-11-21 ENCOUNTER — Telehealth: Payer: Self-pay | Admitting: Family Medicine

## 2016-11-21 DIAGNOSIS — M810 Age-related osteoporosis without current pathological fracture: Secondary | ICD-10-CM

## 2016-11-21 DIAGNOSIS — Z13220 Encounter for screening for lipoid disorders: Secondary | ICD-10-CM

## 2016-11-21 NOTE — Telephone Encounter (Signed)
-----   Message from Eustace Pen, LPN sent at 83/33/8329  9:15 AM EDT ----- Regarding: Labs Lab orders needed. Thank you.  Insurance:  [facility]

## 2016-11-22 ENCOUNTER — Ambulatory Visit (INDEPENDENT_AMBULATORY_CARE_PROVIDER_SITE_OTHER): Payer: Medicare Other

## 2016-11-22 VITALS — BP 140/74 | HR 66 | Temp 97.5°F | Ht 65.5 in | Wt 120.8 lb

## 2016-11-22 DIAGNOSIS — Z23 Encounter for immunization: Secondary | ICD-10-CM | POA: Diagnosis not present

## 2016-11-22 DIAGNOSIS — M810 Age-related osteoporosis without current pathological fracture: Secondary | ICD-10-CM | POA: Diagnosis not present

## 2016-11-22 DIAGNOSIS — Z13220 Encounter for screening for lipoid disorders: Secondary | ICD-10-CM

## 2016-11-22 DIAGNOSIS — Z0000 Encounter for general adult medical examination without abnormal findings: Secondary | ICD-10-CM | POA: Diagnosis not present

## 2016-11-22 LAB — LIPID PANEL
Cholesterol: 207 mg/dL — ABNORMAL HIGH (ref 0–200)
HDL: 84.3 mg/dL (ref >39.00)
LDL Cholesterol: 112 mg/dL — ABNORMAL HIGH (ref 0–99)
NONHDL: 123.05
Total CHOL/HDL Ratio: 2
Triglycerides: 56 mg/dL (ref 0.0–149.0)
VLDL: 11.2 mg/dL (ref 0.0–40.0)

## 2016-11-22 LAB — COMPREHENSIVE METABOLIC PANEL
ALBUMIN: 4.1 g/dL (ref 3.5–5.2)
ALK PHOS: 47 U/L (ref 39–117)
ALT: 10 U/L (ref 0–35)
AST: 18 U/L (ref 0–37)
BUN: 17 mg/dL (ref 6–23)
CO2: 33 mEq/L — ABNORMAL HIGH (ref 19–32)
Calcium: 9.8 mg/dL (ref 8.4–10.5)
Chloride: 102 mEq/L (ref 96–112)
Creatinine, Ser: 0.76 mg/dL (ref 0.40–1.20)
GFR: 77.23 mL/min (ref >60.00)
Glucose, Bld: 76 mg/dL (ref 70–99)
POTASSIUM: 4.6 meq/L (ref 3.5–5.1)
Sodium: 140 mEq/L (ref 135–145)
TOTAL PROTEIN: 7.1 g/dL (ref 6.0–8.3)
Total Bilirubin: 0.6 mg/dL (ref 0.2–1.2)

## 2016-11-22 LAB — VITAMIN D 25 HYDROXY (VIT D DEFICIENCY, FRACTURES): VITD: 32.87 ng/mL (ref 30.00–100.00)

## 2016-11-22 NOTE — Progress Notes (Signed)
Pre visit review using our clinic review tool, if applicable. No additional management support is needed unless otherwise documented below in the visit note. 

## 2016-11-22 NOTE — Progress Notes (Signed)
I reviewed health advisor's note, was available for consultation, and agree with documentation and plan.  

## 2016-11-22 NOTE — Patient Instructions (Addendum)
[name] , Thank you for taking time to come for your Medicare Wellness Visit. I appreciate your ongoing commitment to your health goals. Please review the following plan we discussed and let me know if I can assist you in the future.   These are the goals we discussed: Goals    . Health Management          Starting 11/22/2016, I will continue to do water aerobics for 60 min 3 days per week and to drink at least 32 oz of water daily.        This is a list of the screening recommended for you and due dates:  Health Maintenance  Topic Date Due  . Tetanus Vaccine  11/23/2026*  . Pneumonia vaccines (2 of 2 - PPSV23) 11/22/2017  . Flu Shot  Addressed  . DEXA scan (bone density measurement)  Completed  *Topic was postponed. The date shown is not the original due date.   Preventive Care for Adults  A healthy lifestyle and preventive care can promote health and wellness. Preventive health guidelines for adults include the following key practices.  . A routine yearly physical is a good way to check with your health care provider about your health and preventive screening. It is a chance to share any concerns and updates on your health and to receive a thorough exam.  . Visit your dentist for a routine exam and preventive care every 6 months. Brush your teeth twice a day and floss once a day. Good oral hygiene prevents tooth decay and gum disease.  . The frequency of eye exams is based on your age, health, family medical history, use  of contact lenses, and other factors. Follow your health care provider's recommendations for frequency of eye exams.  . Eat a healthy diet. Foods like vegetables, fruits, whole grains, low-fat dairy products, and lean protein foods contain the nutrients you need without too many calories. Decrease your intake of foods high in solid fats, added sugars, and salt. Eat the right amount of calories for you. Get information about a proper diet from your health care  provider, if necessary.  . Regular physical exercise is one of the most important things you can do for your health. Most adults should get at least 150 minutes of moderate-intensity exercise (any activity that increases your heart rate and causes you to sweat) each week. In addition, most adults need muscle-strengthening exercises on 2 or more days a week.  Silver Sneakers may be a benefit available to you. To determine eligibility, you may visit the website: www.silversneakers.com or contact program at (346)730-3091 Mon-Fri between 8AM-8PM.   . Maintain a healthy weight. The body mass index (BMI) is a screening tool to identify possible weight problems. It provides an estimate of body fat based on height and weight. Your health care provider can find your BMI and can help you achieve or maintain a healthy weight.   For adults 20 years and older: ? A BMI below 18.5 is considered underweight. ? A BMI of 18.5 to 24.9 is normal. ? A BMI of 25 to 29.9 is considered overweight. ? A BMI of 30 and above is considered obese.   . Maintain normal blood lipids and cholesterol levels by exercising and minimizing your intake of saturated fat. Eat a balanced diet with plenty of fruit and vegetables. Blood tests for lipids and cholesterol should begin at age 54 and be repeated every 5 years. If your lipid or cholesterol levels are  high, you are over 50, or you are at high risk for heart disease, you may need your cholesterol levels checked more frequently. Ongoing high lipid and cholesterol levels should be treated with medicines if diet and exercise are not working.  . If you smoke, find out from your health care provider how to quit. If you do not use tobacco, please do not start.  . If you choose to drink alcohol, please do not consume more than 2 drinks per day. One drink is considered to be 12 ounces (355 mL) of beer, 5 ounces (148 mL) of wine, or 1.5 ounces (44 mL) of liquor.  . If you are 56-79 years  old, ask your health care provider if you should take aspirin to prevent strokes.  . Use sunscreen. Apply sunscreen liberally and repeatedly throughout the day. You should seek shade when your shadow is shorter than you. Protect yourself by wearing long sleeves, pants, a wide-brimmed hat, and sunglasses year round, whenever you are outdoors.  . Once a month, do a whole body skin exam, using a mirror to look at the skin on your back. Tell your health care provider of new moles, moles that have irregular borders, moles that are larger than a pencil eraser, or moles that have changed in shape or color.

## 2016-11-22 NOTE — Progress Notes (Signed)
Subjective:   Catherine Young is a 83 y.o. female who presents for Medicare Annual (Subsequent) preventive examination.  Review of Systems:  N/A Cardiac Risk Factors include: advanced age (>47men, >28 women);dyslipidemia     Objective:     Vitals: BP 140/74 (BP Location: Right Arm, Patient Position: Sitting, Cuff Size: Normal)   Pulse 66   Temp (!) 97.5 F (36.4 C) (Oral)   Ht 5' 5.5" (1.664 m) Comment: no shoes  Wt 120 lb 12 oz (54.8 kg)   SpO2 97%   BMI 19.79 kg/m   Body mass index is 19.79 kg/m.   Tobacco History  Smoking Status  . Former Smoker  . Quit date: 01/24/1993  Smokeless Tobacco  . Never Used    Comment: quit 1995     Counseling given: No   Past Medical History:  Diagnosis Date  . Arthritis   . Basal cell carcinoma of tragus, left   . Cataract    right  . Depression   . Intestinal obstruction ([place])   . Osteoporosis    Past Surgical History:  Procedure Laterality Date  . ABDOMINAL HYSTERECTOMY     partial, for menorrhagia  . APPENDECTOMY     Family History  Problem Relation Age of Onset  . Arthritis Mother   . Colon cancer Mother   . Cancer Mother        colon  . Diabetes Father   . Arthritis Sister   . Colon cancer Sister   . Cancer Sister        colon  . Lung cancer Brother   . Cancer Brother        lung  . Arthritis Sister   . Colon cancer Sister   . Cancer Sister        colon   History  Sexual Activity  . Sexual activity: No    Outpatient Encounter Prescriptions as of 11/22/2016  Medication Sig  . aspirin 81 MG tablet Take 81 mg by mouth daily.  . Cholecalciferol (VITAMIN D3) 1000 UNITS CAPS Take 1 capsule by mouth daily.  . clobetasol ointment (TEMOVATE) 6.57 % Apply 1 application topically 2 (two) times daily.  . clonazePAM (KLONOPIN) 1 MG tablet take 1 tablet by mouth twice a day if needed  . Melatonin 5 MG CAPS Take 1 capsule by mouth at bedtime as needed.  . sertraline (ZOLOFT) 25 MG tablet Take 1 tablet (25  mg total) by mouth at bedtime.   No facility-administered encounter medications on file as of 11/22/2016.     Activities of Daily Living In your present state of health, do you have any difficulty performing the following activities: 11/22/2016  Hearing? N  Vision? Y  Difficulty concentrating or making decisions? Y  Walking or climbing stairs? Y  Dressing or bathing? N  Doing errands, shopping? N  Preparing Food and eating ? N  Using the Toilet? N  In the past six months, have you accidently leaked urine? N  Do you have problems with loss of bowel control? N  Managing your Medications? N  Managing your Finances? N  Housekeeping or managing your Housekeeping? N  Some recent data might be hidden    Patient Care Team: Jinny Sanders, MD as PCP - General (Family Medicine) Eulogio Bear, MD as Consulting Physician (Ophthalmology)    Assessment:     Hearing Screening   125Hz  250Hz  500Hz  1000Hz  2000Hz  3000Hz  4000Hz  6000Hz  8000Hz   Right ear:   40 40 40  0    Left ear:   40 40 40  0    Vision Screening Comments: Last vision exam in May 2018 with Dr. Edison Pace   Exercise Activities and Dietary recommendations Current Exercise Habits: Structured exercise class, Type of exercise: Other - see comments;walking (water aerobics), Time (Minutes): 60, Frequency (Times/Week): 3, Weekly Exercise (Minutes/Week): 180, Intensity: Mild, Exercise limited by: None identified  Goals    . Health Management          Starting 11/22/2016, I will continue to do water aerobics for 60 min 3 days per week and to drink at least 32 oz of water daily.       Fall Risk Fall Risk  11/22/2016 07/14/2015 01/13/2015  Falls in the past year? No No No   Depression Screen PHQ 2/9 Scores 11/22/2016 09/20/2016 07/14/2015 01/13/2015  PHQ - 2 Score 2 4 0 2  PHQ- 9 Score 7 11 - 4     Cognitive Function MMSE - Mini Mental State Exam 11/22/2016  Orientation to time 5  Orientation to Place 5  Registration 3    Attention/ Calculation 0  Recall 3  Language- name 2 objects 0  Language- repeat 1  Language- follow 3 step command 2  Language- follow 3 step command-comments unable to follow 1 step of 3 step command  Language- read & follow direction 0  Write a sentence 0  Copy design 0  Total score 19       PLEASE NOTE: A Mini-Cog screen was completed. Maximum score is 20. A value of 0 denotes this part of Folstein MMSE was not completed or the patient failed this part of the Mini-Cog screening.   Mini-Cog Screening Orientation to Time - Max 5 pts Orientation to Place - Max 5 pts Registration - Max 3 pts Recall - Max 3 pts Language Repeat - Max 1 pts Language Follow 3 Step Command - Max 3 pts   Immunization History  Administered Date(s) Administered  . Influenza-Unspecified 11/17/2016  . Pneumococcal Conjugate-13 11/22/2016   Screening Tests Health Maintenance  Topic Date Due  . TETANUS/TDAP  11/23/2026 (Originally 10/17/1950)  . PNA vac Low Risk Adult (2 of 2 - PPSV23) 11/22/2017  . INFLUENZA VACCINE  Addressed  . DEXA SCAN  Completed      Plan:     I have personally reviewed, addressed, and noted the following in the patient's chart:  A. Medical and social history B. Use of alcohol, tobacco or illicit drugs  C. Current medications and supplements D. Functional ability and status E.  Nutritional status F.  Physical activity G. Advance directives H. List of other physicians I.  Hospitalizations, surgeries, and ER visits in previous 12 months J.  [place] to include hearing, vision, cognitive, depression L. Referrals and appointments - none  In addition, I have reviewed and discussed with patient certain preventive protocols, quality metrics, and best practice recommendations. A written personalized care plan for preventive services as well as general preventive health recommendations were provided to patient.  See attached scanned questionnaire for additional  information.   Signed,   Lindell Noe, MHA, BS, LPN Health Coach

## 2016-11-22 NOTE — Progress Notes (Signed)
PCP notes:   Health maintenance:  PCV13 - administered Tetanus - postponed/insurance Flu vaccine - received at [facility]  Abnormal screenings:   Depression score: 7 Mini-Cog score: 19/20 Hearing - failed  Hearing Screening   125Hz  250Hz  500Hz  1000Hz  2000Hz  3000Hz  4000Hz  6000Hz  8000Hz   Right ear:   40 40 40  0    Left ear:   40 40 40  0     Patient concerns:   None  Nurse concerns:  None  Next PCP appt:   12/08/16 @ 1400

## 2016-11-28 ENCOUNTER — Other Ambulatory Visit: Payer: Self-pay | Admitting: Family Medicine

## 2016-11-28 NOTE — Telephone Encounter (Signed)
Last office visit 11/22/2016 with Sharrell Ku for [facility].  Last refilled 10/27/2016 jfor #60 with no refills.  Ok to refill?

## 2016-11-29 NOTE — Telephone Encounter (Signed)
Clonazepam called into [id number], [place]

## 2016-12-08 ENCOUNTER — Other Ambulatory Visit: Payer: Self-pay

## 2016-12-08 ENCOUNTER — Ambulatory Visit (INDEPENDENT_AMBULATORY_CARE_PROVIDER_SITE_OTHER): Payer: Medicare Other | Admitting: Family Medicine

## 2016-12-08 ENCOUNTER — Encounter: Payer: Self-pay | Admitting: Family Medicine

## 2016-12-08 VITALS — BP 140/70 | HR 56 | Temp 97.3°F | Ht 65.5 in | Wt 120.0 lb

## 2016-12-08 DIAGNOSIS — R300 Dysuria: Secondary | ICD-10-CM | POA: Diagnosis not present

## 2016-12-08 DIAGNOSIS — F411 Generalized anxiety disorder: Secondary | ICD-10-CM

## 2016-12-08 DIAGNOSIS — Z0000 Encounter for general adult medical examination without abnormal findings: Secondary | ICD-10-CM | POA: Diagnosis not present

## 2016-12-08 DIAGNOSIS — F5104 Psychophysiologic insomnia: Secondary | ICD-10-CM | POA: Diagnosis not present

## 2016-12-08 DIAGNOSIS — F331 Major depressive disorder, recurrent, moderate: Secondary | ICD-10-CM | POA: Diagnosis not present

## 2016-12-08 DIAGNOSIS — M810 Age-related osteoporosis without current pathological fracture: Secondary | ICD-10-CM

## 2016-12-08 LAB — POC URINALSYSI DIPSTICK (AUTOMATED)
Bilirubin, UA: NEGATIVE
Glucose, UA: NEGATIVE
Ketones, UA: NEGATIVE
Nitrite, UA: NEGATIVE
PROTEIN UA: NEGATIVE
Spec Grav, UA: 1.02 (ref 1.010–1.025)
Urobilinogen, UA: 0.2 E.U./dL
pH, UA: 6 (ref 5.0–8.0)

## 2016-12-08 MED ORDER — SERTRALINE HCL 50 MG PO TABS: 50.0000 mg | ORAL_TABLET | Freq: Every day | ORAL | 5 refills | Status: DC

## 2016-12-08 MED ORDER — SULFAMETHOXAZOLE-TRIMETHOPRIM 800-160 MG PO TABS: 1.0000 | ORAL_TABLET | Freq: Two times a day (BID) | ORAL | 0 refills | Status: DC

## 2016-12-08 MED ORDER — TRAZODONE HCL 50 MG PO TABS: 50.0000 mg | ORAL_TABLET | Freq: Every day | ORAL | 5 refills | Status: DC

## 2016-12-08 NOTE — Progress Notes (Signed)
Subjective:    Patient ID: Catherine Young, female    DOB: 1931-03-04, 83 y.o.   MRN: [id number]  HPI The patient presents for annual medicare wellness, complete physical and review of chronic health problems. He/She also has the following acute concerns today: " I cannot sleep" trouble falling asleep and staying asleep.. Feels nervous at night. 3 hours a night. Using Zquil for sleep at night. Melatonin does not help.  The patient saw Candis Musa, LPN for medicare wellness. Note reviewed in detail and important notes copied below. Health maintenance: PCV13 - administered Tetanus - postponed/insurance Flu vaccine - received at [facility] Abnormal screenings:  Depression score: 7 Mini-Cog score: 19/20 Hearing - failed             Hearing Screening   125Hz  250Hz  500Hz  1000Hz  2000Hz  3000Hz  4000Hz  6000Hz  8000Hz   Right ear:   40 40 40  0    Left ear:   40 40 40  0       12/08/16 Today:  Major moderate depression and GAD:  At last OV in 08/2016 Low dose sertraline was started to  Help her decrease clonazepam use and to help with mood.  PHQ9 down for 11 to 7 at recent check on 10/30 She reports today she is still having issues with sleep and anxiety.  Still needing two a day, occ one. Using right when wakes in morning as she feels nervous and then 6 PM at night.  Reviewed labs in detail  Lab Results  Component Value Date   CHOL 207 (H) 11/22/2016   HDL 84.30 11/22/2016   LDLCALC 112 (H) 11/22/2016   TRIG 56.0 11/22/2016   CHOLHDL 2 11/22/2016    Vit D nml, CMET nml.  Spinal stenosis followed by Dr. Sharlet Salina.  Social History /Family History/Past Medical History reviewed in detail and updated in EMR if needed. Blood pressure 140/70, pulse (!) 56, temperature (!) 97.3 F (36.3 C), decrease clonazepam use and to improve mood.  temperature source Oral, height 5' 5.5" (1.664 m), weight 120 lb (54.4 kg).   BP Readings from Last 3 Encounters:  12/08/16 140/70    11/22/16 140/74  09/20/16 136/66    Stable regular diet.  Body mass index is 19.67 kg/m.  Wt Readings from Last 3 Encounters:  12/08/16 120 lb (54.4 kg)  11/22/16 120 lb 12 oz (54.8 kg)  09/20/16 122 lb 8 oz (55.6 kg)       Review of Systems  Genitourinary: Positive for dysuria.       Intermittent dysuria.. Had last night and today  decrease urine output   Increased frequency and urgency.       Objective:   Physical Exam  Constitutional: Vital signs are normal. She appears well-developed and well-nourished. She is cooperative.  Non-toxic appearance. She does not appear ill. No distress.  Elderly female in NAD, kyphosis  HENT:  Head: Normocephalic.  Right Ear: Hearing, tympanic membrane, external ear and ear canal normal.  Left Ear: Hearing, tympanic membrane, external ear and ear canal normal.  Nose: Nose normal.  Eyes: Conjunctivae, EOM and lids are normal. Pupils are equal, round, and reactive to light. Lids are everted and swept, no foreign bodies found.  Neck: Trachea normal and normal range of motion. Neck supple. Carotid bruit is not present. No thyroid mass and no thyromegaly present.  Cardiovascular: Normal rate, regular rhythm, S1 normal, S2 normal, normal heart sounds and intact distal pulses. Exam reveals no gallop.  No murmur  heard. Pulmonary/Chest: Effort normal and breath sounds normal. No respiratory distress. She has no wheezes. She has no rhonchi. She has no rales.  Abdominal: Soft. Normal appearance and bowel sounds are normal. She exhibits no distension, no fluid wave, no abdominal bruit and no mass. There is no hepatosplenomegaly. There is tenderness in the suprapubic area. There is no rigidity, no rebound, no guarding and no CVA tenderness. No hernia.  Lymphadenopathy:    She has no cervical adenopathy.    She has no axillary adenopathy.  Neurological: She is alert. She has normal strength. No cranial nerve deficit or sensory deficit.  Skin: Skin is  warm, dry and intact. No rash noted.  Psychiatric: Her speech is normal and behavior is normal. Judgment normal. Her mood appears not anxious. Cognition and memory are normal. She does not exhibit a depressed mood.          Assessment & Plan:  The patient's preventative maintenance and recommended screening tests for an annual wellness exam were reviewed in full today. Brought up to date unless services declined.  Counselled on the importance of diet, exercise, and its role in overall health and mortality. The patient's FH and SH was reviewed, including their home life, tobacco status, and drug and alcohol status.   Vaccines: reviewed Pap/DVE:  Not indicated Mammo: not indicated Bone Density: 2016 DEXA.. Due for re-eval. Colon:  Not indicated Smoking Status: none ETOH/ drug FXT:KWIO

## 2016-12-08 NOTE — Addendum Note (Signed)
Addended by: Carter Kitten on: 12/08/2016 04:02 PM   Modules accepted: Orders

## 2016-12-08 NOTE — Patient Instructions (Addendum)
Increase sertraline to 50 mg daily at bedtime.  Start back trazodone at bedtime at 50 mg daily for sleep.  Stop benadryl.  Decrease use of clonazepam to once daily.. Try limiting to just in morning.  Please stop at the front desk to set up referral. Complete course of antibiotics.. We will call with culture results.

## 2016-12-08 NOTE — Assessment & Plan Note (Signed)
Positive UA and typical symptoms.. Treat with antibiotics and send for culture.

## 2016-12-11 LAB — URINE CULTURE
MICRO NUMBER:: 81290423
SPECIMEN QUALITY:: ADEQUATE

## 2016-12-20 ENCOUNTER — Ambulatory Visit (INDEPENDENT_AMBULATORY_CARE_PROVIDER_SITE_OTHER)
Admission: RE | Admit: 2016-12-20 | Discharge: 2016-12-20 | Disposition: A | Discharge status: Home / Self Care | Payer: Medicare Other | Source: Ambulatory Visit | Attending: Family Medicine | Admitting: Family Medicine

## 2016-12-20 DIAGNOSIS — M810 Age-related osteoporosis without current pathological fracture: Secondary | ICD-10-CM

## 2016-12-28 ENCOUNTER — Other Ambulatory Visit: Payer: Self-pay | Admitting: Family Medicine

## 2016-12-28 NOTE — Telephone Encounter (Signed)
Last office visit 12/08/2016.  Last refilled 11/29/2016 for #60 with no refills.  Ok to refill?

## 2016-12-29 NOTE — Telephone Encounter (Signed)
Clonazepam called into [id number], [place]

## 2017-01-05 ENCOUNTER — Encounter: Payer: Self-pay | Admitting: Family Medicine

## 2017-01-05 ENCOUNTER — Other Ambulatory Visit: Payer: Self-pay

## 2017-01-05 ENCOUNTER — Ambulatory Visit: Payer: Medicare Other | Admitting: Family Medicine

## 2017-01-05 VITALS — BP 120/58 | HR 50 | Temp 97.5°F | Ht 65.5 in | Wt 120.2 lb

## 2017-01-05 DIAGNOSIS — F331 Major depressive disorder, recurrent, moderate: Secondary | ICD-10-CM

## 2017-01-05 DIAGNOSIS — M810 Age-related osteoporosis without current pathological fracture: Secondary | ICD-10-CM

## 2017-01-05 DIAGNOSIS — F411 Generalized anxiety disorder: Secondary | ICD-10-CM

## 2017-01-05 DIAGNOSIS — F5104 Psychophysiologic insomnia: Secondary | ICD-10-CM

## 2017-01-05 MED ORDER — ALENDRONATE SODIUM 70 MG PO TABS: 70.0000 mg | ORAL_TABLET | ORAL | 11 refills | Status: DC

## 2017-01-05 MED ORDER — SERTRALINE HCL 100 MG PO TABS: 100.0000 mg | ORAL_TABLET | Freq: Every day | ORAL | 5 refills | Status: DC

## 2017-01-05 NOTE — Assessment & Plan Note (Signed)
No improvement in mood.. Increase sertraline to 100 mg daily. No SE.

## 2017-01-05 NOTE — Assessment & Plan Note (Signed)
Reviewed options.. Start alendronate weekly.. Repeat in 1-2 years. Recommend weight bearing exercise, calcium in diet and vit D supplement 400 IU 1-2 times daily.

## 2017-01-05 NOTE — Progress Notes (Signed)
   Subjective:    Patient ID: Catherine Young, female    DOB: [birth year], 84 y.o.   MRN: [id number]  HPI   83 year old female presents for follow up  4 weeks of depression, major, moderate and GAD.  At last OV on 12/08/2016 her serration was increased to 50 mg daily. She feels it has not improved mood much  Started on trazodone at night for sleep. Has helped sleep at night.. She is getting more like 5 hourof sleep at night.  Told to stop benadryl Recommended decreasing clonazepam.. She is using in morning when feeling jittery.  PHQ9: 6  Osteoporosis: DEXA reviewed in detatil with pt.  -T -2.5 in left femoral neck  pt has kyphosis and history of khyphoplasty  On Ca and vit D, now.   Depression screen [facility] 2/9 01/05/2017 11/22/2016 09/20/2016  Decreased Interest 1 1 1   Down, Depressed, Hopeless 1 1 3   PHQ - 2 Score 2 2 4   Altered sleeping 2 2 3   Tired, decreased energy 1 1 0  Change in appetite 1 1 0  Feeling bad or failure about yourself  1 1 3   Trouble concentrating 0 0 0  Moving slowly or fidgety/restless 0 0 0  Suicidal thoughts 0 0 1  PHQ-9 Score 7 7 11   Difficult doing work/chores Somewhat difficult Somewhat difficult Very difficult    Review of Systems     Objective:   Physical Exam  Constitutional: Vital signs are normal. She appears well-developed and well-nourished. She is cooperative.  Non-toxic appearance. She does not appear ill. No distress.  Elderly female in NAD, kyphosis  HENT:  Head: Normocephalic.  Right Ear: Hearing, tympanic membrane, external ear and ear canal normal.  Left Ear: Hearing, tympanic membrane, external ear and ear canal normal.  Nose: Nose normal.  Eyes: Conjunctivae, EOM and lids are normal. Pupils are equal, round, and reactive to light. Lids are everted and swept, no foreign bodies found.  Neck: Trachea normal and normal range of motion. Neck supple. Carotid bruit is not present. No thyroid mass and no thyromegaly present.    Cardiovascular: Normal rate, regular rhythm, S1 normal, S2 normal, normal heart sounds and intact distal pulses. Exam reveals no gallop.  No murmur heard. Pulmonary/Chest: Effort normal and breath sounds normal. No respiratory distress. She has no wheezes. She has no rhonchi. She has no rales.  Abdominal: Soft. Normal appearance and bowel sounds are normal. She exhibits no distension, no fluid wave, no abdominal bruit and no mass. There is no hepatosplenomegaly. There is tenderness in the suprapubic area. There is no rigidity, no rebound, no guarding and no CVA tenderness. No hernia.  Lymphadenopathy:    She has no cervical adenopathy.    She has no axillary adenopathy.  Neurological: She is alert. She has normal strength. No cranial nerve deficit or sensory deficit.  Skin: Skin is warm, dry and intact. No rash noted.  Psychiatric: Her speech is normal and behavior is normal. Judgment normal. Her mood appears not anxious. Cognition and memory are normal. She does not exhibit a depressed mood.          Assessment & Plan:

## 2017-01-05 NOTE — Assessment & Plan Note (Signed)
Improved with trazodone.  Will have her decrease clonazepam as it may be contributing to sedation during the day and napping.

## 2017-01-05 NOTE — Patient Instructions (Addendum)
Increase sertraline to 100 mg daily at bedtime.  Continue to use trazodone at night for sleep.  No napping during the day.  Try to decrease clonazepam to 1/2 tab daily in AMs.  Start alendronate weekly for osteoporosis.  Continue ca and vit D.  Continue walking as much as possible.

## 2017-02-02 ENCOUNTER — Encounter: Payer: Self-pay | Admitting: Family Medicine

## 2017-02-02 ENCOUNTER — Other Ambulatory Visit: Payer: Self-pay

## 2017-02-02 ENCOUNTER — Ambulatory Visit: Payer: Medicare Other | Admitting: Family Medicine

## 2017-02-02 VITALS — BP 140/70 | HR 56 | Temp 97.3°F | Ht 65.5 in | Wt 119.5 lb

## 2017-02-02 DIAGNOSIS — N3000 Acute cystitis without hematuria: Secondary | ICD-10-CM | POA: Diagnosis not present

## 2017-02-02 DIAGNOSIS — F411 Generalized anxiety disorder: Secondary | ICD-10-CM

## 2017-02-02 DIAGNOSIS — F331 Major depressive disorder, recurrent, moderate: Secondary | ICD-10-CM | POA: Diagnosis not present

## 2017-02-02 DIAGNOSIS — F5104 Psychophysiologic insomnia: Secondary | ICD-10-CM

## 2017-02-02 DIAGNOSIS — R300 Dysuria: Secondary | ICD-10-CM | POA: Diagnosis not present

## 2017-02-02 LAB — POC URINALSYSI DIPSTICK (AUTOMATED)
Bilirubin, UA: NEGATIVE
GLUCOSE UA: NEGATIVE
Ketones, UA: NEGATIVE
NITRITE UA: NEGATIVE
PH UA: 6 (ref 5.0–8.0)
SPEC GRAV UA: 1.025 (ref 1.010–1.025)
UROBILINOGEN UA: NEGATIVE U/dL — AB

## 2017-02-02 MED ORDER — CEPHALEXIN 500 MG PO CAPS: 500.0000 mg | ORAL_CAPSULE | Freq: Two times a day (BID) | ORAL | 0 refills | Status: DC

## 2017-02-02 NOTE — Patient Instructions (Addendum)
We will call with culture results.  Increase water intake.  Complete 7 days of  Keflex.  Continue  Other current medication and keep on working on limiting clonazapam over time if mood allows.

## 2017-02-02 NOTE — Progress Notes (Signed)
Subjective:    Patient ID: Catherine Young, female    DOB: December 10, 1931, 84 y.o.   MRN: [id number]  HPI   84 year old female presents for follow up mood.   At last OV on 12/13 moderate major depression was no better with sertraline initiation.  At last OV increased sertraline to 100 mg daily.  Insomnia was improved with trazodone and I encouraged her to decrease clonazepam to avoid sedation during the day.  Today she reports:   She reports the increase in sertraline has helped more with sleep. Some increase in positivity. She has been able to decrease to once a day clonazapam!!  PHQ9:7   Depression screen [facility] 2/9 01/05/2017 11/22/2016 09/20/2016  Decreased Interest 1 1 1   Down, Depressed, Hopeless 1 1 3   PHQ - 2 Score 2 2 4   Altered sleeping 2 2 3   Tired, decreased energy 1 1 0  Change in appetite 1 1 0  Feeling bad or failure about yourself  1 1 3   Trouble concentrating 0 0 0  Moving slowly or fidgety/restless 0 0 0  Suicidal thoughts 0 0 1  PHQ-9 Score 7 7 11   Difficult doing work/chores Somewhat difficult Somewhat difficult Very difficult     She has also noted new urination issue.Marland Kitchen Dysuria x 1 week. Increased frequency, increase urgency.  no blood urine.  no abdominal pain.  No fever. No CVA tenderness.  Has not treated with anything.  Last UTI 11/2016 staph, pan sensitive  treated with sulfa  Symptoms resolved. Review of Systems  Constitutional: Negative for fatigue and fever.  HENT: Negative for congestion.   Eyes: Negative for pain.  Respiratory: Negative for cough and shortness of breath.   Cardiovascular: Negative for chest pain, palpitations and leg swelling.  Gastrointestinal: Negative for abdominal pain.  Genitourinary: Positive for dysuria and urgency. Negative for flank pain, hematuria, pelvic pain and vaginal bleeding.  Musculoskeletal: Negative for back pain.  Neurological: Negative for syncope, light-headedness and headaches.    Psychiatric/Behavioral: Negative for dysphoric mood.       Objective:   Physical Exam  Constitutional: Vital signs are normal. She appears well-developed and well-nourished. She is cooperative.  Non-toxic appearance. She does not appear ill. No distress.  Elderly female in NAD  HENT:  Head: Normocephalic.  Right Ear: Hearing, tympanic membrane, external ear and ear canal normal. Tympanic membrane is not erythematous, not retracted and not bulging.  Left Ear: Hearing, tympanic membrane, external ear and ear canal normal. Tympanic membrane is not erythematous, not retracted and not bulging.  Nose: No mucosal edema or rhinorrhea. Right sinus exhibits no maxillary sinus tenderness and no frontal sinus tenderness. Left sinus exhibits no maxillary sinus tenderness and no frontal sinus tenderness.  Mouth/Throat: Uvula is midline, oropharynx is clear and moist and mucous membranes are normal.  Eyes: Conjunctivae, EOM and lids are normal. Pupils are equal, round, and reactive to light. Lids are everted and swept, no foreign bodies found.  Neck: Trachea normal and normal range of motion. Neck supple. Carotid bruit is not present. No thyroid mass and no thyromegaly present.  Cardiovascular: Normal rate, regular rhythm, S1 normal, S2 normal, normal heart sounds, intact distal pulses and normal pulses. Exam reveals no gallop and no friction rub.  No murmur heard. Pulmonary/Chest: Effort normal and breath sounds normal. No tachypnea. No respiratory distress. She has no decreased breath sounds. She has no wheezes. She has no rhonchi. She has no rales.  Abdominal: Soft. Normal appearance and  bowel sounds are normal. There is no hepatosplenomegaly. There is no tenderness. There is no CVA tenderness.  Neurological: She is alert.  Skin: Skin is warm, dry and intact. No rash noted.  Psychiatric: Her speech is normal and behavior is normal. Judgment and thought content normal. Her mood appears not anxious.  Cognition and memory are normal. She does not exhibit a depressed mood.          Assessment & Plan:

## 2017-02-03 LAB — URINE CULTURE
MICRO NUMBER: 90040747
SPECIMEN QUALITY: ADEQUATE

## 2017-02-23 ENCOUNTER — Other Ambulatory Visit: Payer: Self-pay | Admitting: Family Medicine

## 2017-02-23 NOTE — Telephone Encounter (Signed)
Last office visit 02/02/2017.  Last refilled 12/29/2016  #60 with no refill.  Last office visit mentioned will have her decrease clonazepam as it may be contributing to sedation during the day and napping.  Refill?

## 2017-02-24 NOTE — Telephone Encounter (Signed)
Call pt.. Has she been able to decrease dose?

## 2017-02-24 NOTE — Telephone Encounter (Signed)
Clonazepam called into [facility] [place], [place]

## 2017-02-24 NOTE — Telephone Encounter (Signed)
Spoke with Ms. Terance Hart.  She states she has cut down to taking 1/2 tablet twice a day and she is not napping during the day.

## 2017-02-28 DIAGNOSIS — N3000 Acute cystitis without hematuria: Secondary | ICD-10-CM | POA: Insufficient documentation

## 2017-02-28 NOTE — Assessment & Plan Note (Signed)
Continue weaning down clonazepam

## 2017-02-28 NOTE — Assessment & Plan Note (Signed)
We will call with culture results.  Increase water intake.  Complete 7 days of  Keflex.

## 2017-02-28 NOTE — Assessment & Plan Note (Signed)
Improved control with increase in sertraline.

## 2017-03-13 ENCOUNTER — Ambulatory Visit: Payer: Self-pay | Admitting: *Deleted

## 2017-03-13 NOTE — Telephone Encounter (Signed)
Pt reports intermittent episodes of diarrhea since taking Fosamax.  Takes dose every Sunday. Reports severe abdominal cramping prior to BM. Had 1 episode Sunday 1 hour after taking Fosamax.  states had days last week of 3-4 diarrheal episodes. States stool is loose "some form."  Had a "a few" episodes of incontinence.No vomiting "Was nauseated a couple weeks ago."  Reports mild dizziness this AM, B/P 130/70 per home monitor. States appetite good, staying hydrated. Appt made with R. Baity for tomorrow. Pt requested late afternoon appt: Care advice given per protocol.  Reason for Disposition . [1] MILD diarrhea (e.g., 1-3 or more stools than normal in past 24 hours) without known cause AND [2] present >  7 days  Answer Assessment - Initial Assessment Questions 1. DIARRHEA SEVERITY: "How bad is the diarrhea?" "How many extra stools have you had in the past 24 hours than normal?"    - MILD: Few loose or mushy BMs; increase of 1-3 stools over normal daily number of stools; mild increase in ostomy output.   - MODERATE: Increase of 4-6 stools daily over normal; moderate increase in ostomy output.   - SEVERE (or Worst Possible): Increase of 7 or more stools daily over normal; moderate increase in ostomy output; incontinence.    Mild now but every day last week had 3 or 4 episodes. 2. ONSET: "When did the diarrhea begin?"      Sunday 1 episode after taking Fosamax 3. BM CONSISTENCY: "How loose or watery is the diarrhea?"     "Loose,some form." 4. VOMITING: "Are you also vomiting?" If so, ask: "How many times in the past 24 hours?"      No, "Nauseated a few weeks ago." 5. ABDOMINAL PAIN: "Are you having any abdominal pain?" If yes: "What does it feel like?" (e.g., crampy, dull, intermittent, constant)     Cramping prior to BM 6. ABDOMINAL PAIN SEVERITY: If present, ask: "How bad is the pain?"  (e.g., Scale 1-10; mild, moderate, or severe)    - MILD (1-3): doesn't interfere with normal activities, abdomen  soft and not tender to touch     - MODERATE (4-7): interferes with normal activities or awakens from sleep, tender to touch     - SEVERE (8-10): excruciating pain, doubled over, unable to do any normal activities       Severe, "relieved after BM" 7. ORAL INTAKE: If vomiting, "Have you been able to drink liquids?" "How much fluids have you had in the past 24 hours?"     N/A 8. HYDRATION: "Any signs of dehydration?" (e.g., dry mouth [not just dry lips], too weak to stand, dizziness, new weight loss) "When did you last urinate?"    "I drink a lot of water."  9. EXPOSURE: "Have you traveled to a foreign country recently?" "Have you been exposed to anyone with diarrhea?" "Could you have eaten any food that was spoiled?"     no 10. OTHER SYMPTOMS: "Do you have any other symptoms?" (e.g., fever, blood in stool)       "Dizziness once in a while." this am.  B/P this am 130/70.  Protocols used: [facility]

## 2017-03-14 ENCOUNTER — Ambulatory Visit: Payer: Medicare Other | Admitting: Internal Medicine

## 2017-03-14 ENCOUNTER — Encounter: Payer: Self-pay | Admitting: Internal Medicine

## 2017-03-14 VITALS — BP 144/78 | HR 60 | Temp 97.6°F | Wt 117.0 lb

## 2017-03-14 DIAGNOSIS — R197 Diarrhea, unspecified: Secondary | ICD-10-CM

## 2017-03-14 DIAGNOSIS — R109 Unspecified abdominal pain: Secondary | ICD-10-CM | POA: Diagnosis not present

## 2017-03-14 DIAGNOSIS — T887XXA Unspecified adverse effect of drug or medicament, initial encounter: Secondary | ICD-10-CM | POA: Diagnosis not present

## 2017-03-15 ENCOUNTER — Encounter: Payer: Self-pay | Admitting: Internal Medicine

## 2017-03-15 NOTE — Patient Instructions (Signed)
Diarrhea, Adult °Diarrhea is when you have loose and water poop (stool) often. Diarrhea can make you feel weak and cause you to get dehydrated. Dehydration can make you tired and thirsty, make you have a dry mouth, and make it so you pee (urinate) less often. Diarrhea often lasts 2-3 days. However, it can last longer if it is a sign of something more serious. It is important to treat your diarrhea as told by your doctor. °Follow these instructions at home: °Eating and drinking ° °Follow these recommendations as told by your doctor: °· Take an oral rehydration solution (ORS). This is a drink that is sold at pharmacies and stores. °· Drink clear fluids, such as: °? Water. °? Ice chips. °? Diluted fruit juice. °? Low-calorie sports drinks. °· Eat bland, easy-to-digest foods in small amounts as you are able. These foods include: °? Bananas. °? Applesauce. °? Rice. °? Low-fat (lean) meats. °? Toast. °? Crackers. °· Avoid drinking fluids that have a lot of sugar or caffeine in them. °· Avoid alcohol. °· Avoid spicy or fatty foods. ° °General instructions ° °· Drink enough fluid to keep your pee (urine) clear or pale yellow. °· Wash your hands often. If you cannot use soap and water, use hand sanitizer. °· Make sure that all people in your home wash their hands well and often. °· Take over-the-counter and prescription medicines only as told by your doctor. °· Rest at home while you get better. °· Watch your condition for any changes. °· Take a warm bath to help with any burning or pain from having diarrhea. °· Keep all follow-up visits as told by your doctor. This is important. °Contact a doctor if: °· You have a fever. °· Your diarrhea gets worse. °· You have new symptoms. °· You cannot keep fluids down. °· You feel light-headed or dizzy. °· You have a headache. °· You have muscle cramps. °Get help right away if: °· You have chest pain. °· You feel very weak or you pass out (faint). °· You have bloody or black poop or  poop that look like tar. °· You have very bad pain, cramping, or bloating in your belly (abdomen). °· You have trouble breathing or you are breathing very quickly. °· Your heart is beating very quickly. °· Your skin feels cold and clammy. °· You feel confused. °· You have signs of dehydration, such as: °? Dark pee, hardly any pee, or no pee. °? Cracked lips. °? Dry mouth. °? Sunken eyes. °? Sleepiness. °? Weakness. °This information is not intended to replace advice given to you by your health care provider. Make sure you discuss any questions you have with your health care provider. °Document Released: 06/29/2007 Document Revised: 07/31/2015 Document Reviewed: 09/16/2014 °Elsevier Interactive Patient Education © 2018 [employer] Inc[employer] ° °

## 2017-03-15 NOTE — Progress Notes (Signed)
Subjective:    Patient ID: Catherine Young, female    DOB: [birth year], 84 y.o.   MRN: [id number]  HPI  Pt presents to the clinic today with c/o abdominal cramping and diarrhea. This started 2 months ago. She noticed is shortly after starting Fosamax for osteoporosis. She reports after her dose on Sunday, the abdominal cramping will start within 30 minutes to 1 hour, then followed by diarrhea. It will last 3 days and then she will improve, but start again after her next dose of Fosomax. She has tried Imodium with some relief.  Review of Systems      Past Medical History:  Diagnosis Date  . Arthritis   . Basal cell carcinoma of tragus, left   . Cataract    right  . Depression   . Intestinal obstruction ([place])   . Osteoporosis     Current Outpatient Medications  Medication Sig Dispense Refill  . alendronate (FOSAMAX) 70 MG tablet Take 1 tablet (70 mg total) by mouth every 7 (seven) days. Take with a full glass of water on an empty stomach. 4 tablet 11  . aspirin 81 MG tablet Take 81 mg by mouth daily.    . Cholecalciferol (VITAMIN D3) 1000 UNITS CAPS Take 1 capsule by mouth daily.    . clobetasol ointment (TEMOVATE) 2.83 % Apply 1 application topically 2 (two) times daily. 30 g 0  . clonazePAM (KLONOPIN) 1 MG tablet Take 0.5 tablets (0.5 mg total) by mouth 2 (two) times daily. 30 tablet 0  . sertraline (ZOLOFT) 100 MG tablet Take 1 tablet (100 mg total) by mouth at bedtime. 30 tablet 5  . traZODone (DESYREL) 50 MG tablet Take 1 tablet (50 mg total) at bedtime by mouth. 30 tablet 5   No current facility-administered medications for this visit.     No Known Allergies  Family History  Problem Relation Age of Onset  . Arthritis Mother   . Colon cancer Mother   . Cancer Mother        colon  . Diabetes Father   . Arthritis Sister   . Colon cancer Sister   . Cancer Sister        colon  . Lung cancer Brother   . Cancer Brother        lung  . Arthritis Sister   . Colon  cancer Sister   . Cancer Sister        colon    Social History   Socioeconomic History  . Marital status: Widowed    Spouse name: Not on file  . Number of children: Not on file  . Years of education: Not on file  . Highest education level: Not on file  Social Needs  . Financial resource strain: Not on file  . Food insecurity - worry: Not on file  . Food insecurity - inability: Not on file  . Transportation needs - medical: Not on file  . Transportation needs - non-medical: Not on file  Occupational History  . Not on file  Tobacco Use  . Smoking status: Former Smoker    Last attempt to quit: 01/24/1993    Years since quitting: 24.1  . Smokeless tobacco: Never Used  . Tobacco comment: quit 1995  Substance and Sexual Activity  . Alcohol use: Yes    Alcohol/week: 4.2 oz    Types: 7 Glasses of wine per week    Comment: nightly glass of wine  . Drug use: No  . Sexual activity:  No  Other Topics Concern  . Not on file  Social History Narrative  . Not on file     Constitutional: Denies fever, malaise, fatigue, headache or abrupt weight changes.  Gastrointestinal: Pt reports abdominal cramping and diarrhea. Denies bloating, constipation, or blood in the stool.   No other specific complaints in a complete review of systems (except as listed in HPI above).  Objective:   Physical Exam   BP (!) 144/78   Pulse 60   Temp 97.6 F (36.4 C) (Oral)   Wt 117 lb (53.1 kg)   BMI 19.17 kg/m  Wt Readings from Last 3 Encounters:  03/14/17 117 lb (53.1 kg)  02/02/17 119 lb 8 oz (54.2 kg)  01/05/17 120 lb 4 oz (54.5 kg)    General: Appears her stated age, in NAD. Abdomen: Soft and nontender. Hypoactive bowel sounds.  Musculoskeletal: Kyphotic. No difficulty with gait.    BMET    Component Value Date/Time   NA 140 11/22/2016 1046   K 4.6 11/22/2016 1046   CL 102 11/22/2016 1046   CO2 33 (H) 11/22/2016 1046   GLUCOSE 76 11/22/2016 1046   BUN 17 11/22/2016 1046    CREATININE 0.76 11/22/2016 1046   CALCIUM 9.8 11/22/2016 1046   GFRNONAA >60 07/31/2006 1047   GFRAA  07/31/2006 1047    >60        The eGFR has been calculated using the MDRD equation. This calculation has not been validated in all clinical    Lipid Panel     Component Value Date/Time   CHOL 207 (H) 11/22/2016 1046   TRIG 56.0 11/22/2016 1046   HDL 84.30 11/22/2016 1046   CHOLHDL 2 11/22/2016 1046   VLDL 11.2 11/22/2016 1046   LDLCALC 112 (H) 11/22/2016 1046    CBC    Component Value Date/Time   WBC 6.8 08/05/2016 1326   RBC 4.27 08/05/2016 1326   HGB 13.7 08/05/2016 1326   HCT 41.1 08/05/2016 1326   PLT 269.0 08/05/2016 1326   MCV 96.2 08/05/2016 1326   MCHC 33.4 08/05/2016 1326   RDW 13.4 08/05/2016 1326   LYMPHSABS 1.8 08/05/2016 1326   MONOABS 0.5 08/05/2016 1326   EOSABS 0.2 08/05/2016 1326   BASOSABS 0.0 08/05/2016 1326    Hgb A1C No results found for: HGBA1C         Assessment & Plan:   Abdominal Cramping and Diarrhea, Medication Side Effect:  Will stop Fosamax D/w PCP, recommend Prolia If she is interested in Prolia, will get this set up with [facility], CMA Ok to use Imodium as needed, discussed overuse Encouraged 30 minutes of weight bearing exercise daily Continue Calcium and Vit D  Return precautions discussed Catherine Silversmith, NP

## 2017-04-01 ENCOUNTER — Other Ambulatory Visit: Payer: Self-pay | Admitting: Family Medicine

## 2017-04-03 NOTE — Telephone Encounter (Signed)
Last office visit 0219/2019 with R. Baity.  Last refilled 02/24/2017 for #30 with no refills.  Ok to refill?

## 2017-04-05 ENCOUNTER — Telehealth: Payer: Self-pay | Admitting: Family Medicine

## 2017-04-05 NOTE — Telephone Encounter (Signed)
Copied from [place] [id number]. Topic: Quick Communication - Rx Refill/Question >> Apr 05, 2017  4:30 PM Neva Seat wrote: clonazePAM (KLONOPIN) 1 MG tablet  Pt is needing refill on Rx.  [id number] - [place], [place] - [place] AT [place] [address] [place] [address] Phone: [phone] Fax: [phone]

## 2017-04-06 NOTE — Telephone Encounter (Signed)
Patient called, left detailed VM that Clonazepam 1 mg 30 tab/0 refills was sent to [facility] on [address] in [place] on 04/04/17. Advised to call the pharmacy to see if the refill is there and call the office back if the medication is not there.

## 2017-05-08 ENCOUNTER — Other Ambulatory Visit: Payer: Self-pay | Admitting: Family Medicine

## 2017-05-08 NOTE — Telephone Encounter (Signed)
Last office visit 03/14/2017 with R. Baity.  Last refilled 04/04/2017 for #30 with no refills.  Ok to refill?

## 2017-05-23 ENCOUNTER — Encounter: Payer: Self-pay | Admitting: *Deleted

## 2017-05-25 ENCOUNTER — Encounter
Admission: RE | Disposition: A | Discharge status: Home / Self Care | Payer: Self-pay | Source: Ambulatory Visit | Attending: Ophthalmology

## 2017-05-25 ENCOUNTER — Other Ambulatory Visit: Payer: Self-pay

## 2017-05-25 ENCOUNTER — Ambulatory Visit: Payer: Medicare Other | Admitting: Certified Registered Nurse Anesthetist

## 2017-05-25 ENCOUNTER — Ambulatory Visit
Admission: RE | Admit: 2017-05-25 | Discharge: 2017-05-25 | Disposition: A | Discharge status: Home / Self Care | Payer: Medicare Other | Source: Ambulatory Visit | Attending: Ophthalmology | Admitting: Ophthalmology

## 2017-05-25 DIAGNOSIS — Z7982 Long term (current) use of aspirin: Secondary | ICD-10-CM | POA: Insufficient documentation

## 2017-05-25 DIAGNOSIS — F329 Major depressive disorder, single episode, unspecified: Secondary | ICD-10-CM | POA: Diagnosis not present

## 2017-05-25 DIAGNOSIS — H2511 Age-related nuclear cataract, right eye: Secondary | ICD-10-CM | POA: Diagnosis present

## 2017-05-25 DIAGNOSIS — I10 Essential (primary) hypertension: Secondary | ICD-10-CM | POA: Diagnosis not present

## 2017-05-25 DIAGNOSIS — Z87891 Personal history of nicotine dependence: Secondary | ICD-10-CM | POA: Insufficient documentation

## 2017-05-25 DIAGNOSIS — Z79899 Other long term (current) drug therapy: Secondary | ICD-10-CM | POA: Diagnosis not present

## 2017-05-25 HISTORY — DX: Essential (primary) hypertension: I10

## 2017-05-25 HISTORY — PX: CATARACT EXTRACTION W/PHACO: SHX586

## 2017-05-25 HISTORY — DX: Other complications of anesthesia, initial encounter: T88.59XA

## 2017-05-25 HISTORY — DX: Adverse effect of unspecified anesthetic, initial encounter: T41.45XA

## 2017-05-25 SURGERY — PHACOEMULSIFICATION, CATARACT, WITH IOL INSERTION
Anesthesia: Monitor Anesthesia Care | Laterality: Right

## 2017-05-25 MED ORDER — SODIUM HYALURONATE 23 MG/ML IO SOLN
INTRAOCULAR | Status: DC | PRN
  Administered 2017-05-25: 0.6 mL via INTRAOCULAR

## 2017-05-25 MED ORDER — FENTANYL CITRATE (PF) 100 MCG/2ML IJ SOLN
INTRAMUSCULAR | Status: AC
  Filled 2017-05-25: qty 2

## 2017-05-25 MED ORDER — ACETAMINOPHEN 325 MG PO TABS
ORAL_TABLET | ORAL | Status: AC
  Filled 2017-05-25: qty 2

## 2017-05-25 MED ORDER — SODIUM CHLORIDE 0.9 % IV SOLN
INTRAVENOUS | Status: DC
  Administered 2017-05-25: 08:00:00 via INTRAVENOUS

## 2017-05-25 MED ORDER — ARMC OPHTHALMIC DILATING DROPS
1.0000 "application " | OPHTHALMIC | Status: AC
  Administered 2017-05-25 (×3): 1 via OPHTHALMIC

## 2017-05-25 MED ORDER — LIDOCAINE HCL (PF) 4 % IJ SOLN
INTRAMUSCULAR | Status: AC
  Filled 2017-05-25: qty 5

## 2017-05-25 MED ORDER — LIDOCAINE HCL (PF) 4 % IJ SOLN
INTRAOCULAR | Status: DC | PRN
  Administered 2017-05-25: .1 mL via OPHTHALMIC

## 2017-05-25 MED ORDER — MIDAZOLAM HCL 2 MG/2ML IJ SOLN
INTRAMUSCULAR | Status: AC
  Filled 2017-05-25: qty 2

## 2017-05-25 MED ORDER — MOXIFLOXACIN HCL 0.5 % OP SOLN
OPHTHALMIC | Status: DC | PRN
  Administered 2017-05-25: 0.2 mL via OPHTHALMIC

## 2017-05-25 MED ORDER — ARMC OPHTHALMIC DILATING DROPS
OPHTHALMIC | Status: AC
  Administered 2017-05-25: 1 via OPHTHALMIC
  Filled 2017-05-25: qty 0.4

## 2017-05-25 MED ORDER — MIDAZOLAM HCL 2 MG/2ML IJ SOLN
INTRAMUSCULAR | Status: DC | PRN
  Administered 2017-05-25: 1 mg via INTRAVENOUS

## 2017-05-25 MED ORDER — GLYCOPYRROLATE 0.2 MG/ML IJ SOLN
INTRAMUSCULAR | Status: DC | PRN
  Administered 2017-05-25 (×2): 0.1 mg via INTRAVENOUS

## 2017-05-25 MED ORDER — SODIUM HYALURONATE 23 MG/ML IO SOLN
INTRAOCULAR | Status: AC
  Filled 2017-05-25: qty 0.6

## 2017-05-25 MED ORDER — POVIDONE-IODINE 5 % OP SOLN
OPHTHALMIC | Status: DC | PRN
  Administered 2017-05-25: 1 via OPHTHALMIC

## 2017-05-25 MED ORDER — SODIUM HYALURONATE 10 MG/ML IO SOLN
INTRAOCULAR | Status: DC | PRN
  Administered 2017-05-25: 0.55 mL via INTRAOCULAR

## 2017-05-25 MED ORDER — POVIDONE-IODINE 5 % OP SOLN
OPHTHALMIC | Status: AC
  Filled 2017-05-25: qty 30

## 2017-05-25 MED ORDER — ONDANSETRON HCL 4 MG/2ML IJ SOLN: 4.0000 mg | Freq: Once | INTRAMUSCULAR | Status: DC | PRN

## 2017-05-25 MED ORDER — ACETAMINOPHEN 325 MG PO TABS
650.0000 mg | ORAL_TABLET | Freq: Once | ORAL | Status: AC
  Administered 2017-05-25: 650 mg via ORAL

## 2017-05-25 MED ORDER — MOXIFLOXACIN HCL 0.5 % OP SOLN
OPHTHALMIC | Status: AC
  Filled 2017-05-25: qty 3

## 2017-05-25 MED ORDER — BSS IO SOLN
INTRAOCULAR | Status: DC | PRN
  Administered 2017-05-25: 1 via INTRAOCULAR

## 2017-05-25 MED ORDER — FENTANYL CITRATE (PF) 100 MCG/2ML IJ SOLN
INTRAMUSCULAR | Status: DC | PRN
  Administered 2017-05-25: 25 ug via INTRAVENOUS

## 2017-05-25 MED ORDER — MOXIFLOXACIN HCL 0.5 % OP SOLN: 1.0000 [drp] | OPHTHALMIC | Status: DC | PRN

## 2017-05-25 MED ORDER — EPINEPHRINE PF 1 MG/ML IJ SOLN
INTRAMUSCULAR | Status: AC
  Filled 2017-05-25: qty 1

## 2017-05-25 MED ORDER — GLYCOPYRROLATE 0.2 MG/ML IJ SOLN
INTRAMUSCULAR | Status: AC
  Filled 2017-05-25: qty 1

## 2017-05-25 MED ORDER — FENTANYL CITRATE (PF) 100 MCG/2ML IJ SOLN: 25.0000 ug | INTRAMUSCULAR | Status: DC | PRN

## 2017-05-25 SURGICAL SUPPLY — 19 items
DISSECTOR HYDRO NUCLEUS 50X22 (MISCELLANEOUS) ×2 IMPLANT
GLOVE BIO SURGEON STRL SZ8 (GLOVE) ×2 IMPLANT
GLOVE BIOGEL M 6.5 STRL (GLOVE) ×2 IMPLANT
GLOVE SURG LX 7.5 STRW (GLOVE) ×1
GLOVE SURG LX STRL 7.5 STRW (GLOVE) ×1 IMPLANT
GOWN STRL REUS W/ TWL LRG LVL3 (GOWN DISPOSABLE) ×2 IMPLANT
GOWN STRL REUS W/TWL LRG LVL3 (GOWN DISPOSABLE) ×4
LABEL CATARACT MEDS ST (LABEL) ×2 IMPLANT
LENS IOL TECNIS ITEC 17.0 (Intraocular Lens) ×1 IMPLANT
PACK CATARACT (MISCELLANEOUS) ×2 IMPLANT
PACK CATARACT KING (MISCELLANEOUS) ×2 IMPLANT
PACK EYE AFTER SURG (MISCELLANEOUS) ×2 IMPLANT
SOL BAL SALT 15ML (MISCELLANEOUS) ×2
SOL BSS BAG (MISCELLANEOUS) ×2
SOLUTION BAL SALT 15ML (MISCELLANEOUS) IMPLANT
SOLUTION BSS BAG (MISCELLANEOUS) ×1 IMPLANT
SPEAR PVA EYE SURG (MISCELLANEOUS) ×2 IMPLANT
WATER STERILE IRR 250ML POUR (IV SOLUTION) ×2 IMPLANT
WIPE NON LINTING 3.25X3.25 (MISCELLANEOUS) ×2 IMPLANT

## 2017-05-25 NOTE — Anesthesia Procedure Notes (Signed)
Procedure Name: MAC Date/Time: 05/25/2017 9:28 AM Performed by: Rudean Hitt, CRNA Pre-anesthesia Checklist: Patient identified, Emergency Drugs available, Suction available, Patient being monitored and Timeout performed Patient Re-evaluated:Patient Re-evaluated prior to induction Oxygen Delivery Method: Nasal cannula

## 2017-05-25 NOTE — Anesthesia Preprocedure Evaluation (Addendum)
Anesthesia Evaluation  Patient identified by MRN, date of birth, ID band Patient awake    Reviewed: Allergy & Precautions, NPO status , Patient's Chart, lab work & pertinent test results  History of Anesthesia Complications (+) PROLONGED EMERGENCE and history of anesthetic complications  Airway Mallampati: III  TM Distance: <3 FB     Dental  (+) Teeth Intact   Pulmonary former smoker,    Pulmonary exam normal        Cardiovascular hypertension, Normal cardiovascular exam     Neuro/Psych PSYCHIATRIC DISORDERS Anxiety Depression    GI/Hepatic Neg liver ROS,   Endo/Other  negative endocrine ROS  Renal/GU negative Renal ROS  negative genitourinary   Musculoskeletal  (+) Arthritis ,   Abdominal Normal abdominal exam  (+)   Peds negative pediatric ROS (+)  Hematology negative hematology ROS (+)   Anesthesia Other Findings Past Medical History: No date: Arthritis No date: Basal cell carcinoma of tragus, left No date: Cataract     Comment:  right No date: Complication of anesthesia     Comment:  COULD'T MOVE WHEN WAKING UP AFTER SECONT COLON SURGERY No date: Depression No date: Hypertension No date: Intestinal obstruction ([facility]) No date: Osteoporosis  Reproductive/Obstetrics                            Anesthesia Physical Anesthesia Plan  ASA: II  Anesthesia Plan: MAC   Post-op Pain Management:    Induction: Intravenous  PONV Risk Score and Plan:   Airway Management Planned: Nasal Cannula  Additional Equipment:   Intra-op Plan:   Post-operative Plan:   Informed Consent: I have reviewed the patients History and Physical, chart, labs and discussed the procedure including the risks, benefits and alternatives for the proposed anesthesia with the patient or authorized representative who has indicated his/her understanding and acceptance.   Dental advisory given  Plan Discussed  with: CRNA and Surgeon  Anesthesia Plan Comments:         Anesthesia Quick Evaluation

## 2017-05-25 NOTE — Anesthesia Postprocedure Evaluation (Signed)
Anesthesia Post Note  Patient: Catherine Young  Procedure(s) Performed: CATARACT EXTRACTION PHACO AND INTRAOCULAR LENS PLACEMENT (IOC) (Right )  Anesthesia Type: MAC     Last Vitals:  Vitals:   05/25/17 0753 05/25/17 0959  BP: (!) 144/65 139/79  Pulse: (!) 55 65  Resp: 16 16  Temp: (!) 36.2 C (!) 36.2 C  SpO2:  93%    Last Pain:  Vitals:   05/25/17 0959  TempSrc: Temporal  PainSc:                  Rudean Hitt

## 2017-05-25 NOTE — Op Note (Signed)
OPERATIVE NOTE  Catherine Young [id number] 05/25/2017   PREOPERATIVE DIAGNOSIS:  Nuclear sclerotic cataract right eye.  H25.11   POSTOPERATIVE DIAGNOSIS:    Nuclear sclerotic cataract right eye.     PROCEDURE:  Phacoemusification with posterior chamber intraocular lens placement of the right eye   LENS:   Implant Name Type Inv. Item Serial No. Manufacturer Lot No. LRB No. Used  LENS IOL DIOP 17.0 - [id number] Intraocular Lens LENS IOL DIOP 17.0 [id number] AMO  Right 1       PCB00 +17.0   ULTRASOUND TIME: 0 minutes 39 seconds.  CDE 3.71   SURGEON:  Benay Pillow, MD, MPH  ANESTHESIOLOGIST: Anesthesiologist: Alvin Critchley, MD CRNA: Rudean Hitt, CRNA   ANESTHESIA:  Topical with tetracaine drops augmented with 1% preservative-free intracameral lidocaine.  ESTIMATED BLOOD LOSS: less than 1 mL.   COMPLICATIONS:  None.   DESCRIPTION OF PROCEDURE:  The patient was identified in the holding room and transported to the operating room and placed in the supine position under the operating microscope.  The right eye was identified as the operative eye and it was prepped and draped in the usual sterile ophthalmic fashion.   A 1.0 millimeter clear-corneal paracentesis was made at the 10:30 position. 0.5 ml of preservative-free 1% lidocaine with epinephrine was injected into the anterior chamber.  The anterior chamber was filled with Healon 5 viscoelastic.  A 2.4 millimeter keratome was used to make a near-clear corneal incision at the 8:00 position.  A curvilinear capsulorrhexis was made with a cystotome and capsulorrhexis forceps.  Balanced salt solution was used to hydrodissect and hydrodelineate the nucleus.   Phacoemulsification was then used in stop and chop fashion to remove the lens nucleus and epinucleus.  The remaining cortex was then removed using the irrigation and aspiration handpiece. Healon was then placed into the capsular bag to distend it for lens placement.  A lens was  then injected into the capsular bag.  The remaining viscoelastic was aspirated.   Wounds were hydrated with balanced salt solution.  The anterior chamber was inflated to a physiologic pressure with balanced salt solution.   Intracameral vigamox 0.1 mL undiluted was injected into the eye and a drop placed onto the ocular surface.  There was heavy pooling of fluids around the eye throughout the surgery which was managed with judicious use of topical fluids and wexcell sponges as well as a wick.  No wound leaks were noted.  The patient was taken to the recovery room in stable condition without complications of anesthesia or surgery  Benay Pillow 05/25/2017, 9:57 AM

## 2017-05-25 NOTE — Transfer of Care (Signed)
Immediate Anesthesia Transfer of Care Note  Patient: Catherine Young  Procedure(s) Performed: CATARACT EXTRACTION PHACO AND INTRAOCULAR LENS PLACEMENT (IOC) (Right )  Patient Location: PACU  Anesthesia Type:MAC  Level of Consciousness: awake, alert  and oriented  Airway & Oxygen Therapy: Patient Spontanous Breathing  Post-op Assessment: Report given to RN and Post -op Vital signs reviewed and stable  Post vital signs: Reviewed and stable  Last Vitals:  Vitals Value Taken Time  BP 139/79 05/25/2017  9:59 AM  Temp 36.2 C 05/25/2017  9:59 AM  Pulse 65 05/25/2017  9:59 AM  Resp 16 05/25/2017  9:59 AM  SpO2 93 % 05/25/2017  9:59 AM    Last Pain:  Vitals:   05/25/17 0959  TempSrc: Temporal  PainSc:          Complications: No apparent anesthesia complications

## 2017-05-25 NOTE — H&P (Signed)
The History and Physical notes are on paper, have been signed, and are to be scanned.   I have examined the patient and there are no changes to the H&P.   Catherine Young 05/25/2017 9:04 AM

## 2017-05-25 NOTE — Anesthesia Post-op Follow-up Note (Signed)
Anesthesia QCDR form completed.        

## 2017-05-25 NOTE — Discharge Instructions (Signed)
Eye Surgery Discharge Instructions  Expect mild scratchy sensation or mild soreness. DO NOT RUB YOUR EYE!  The day of surgery:  Minimal physical activity, but bed rest is not required  No reading, computer work, or close hand work  No bending, lifting, or straining.  May watch TV  For 24 hours:  No driving, legal decisions, or alcoholic beverages  Safety precautions  Eat anything you prefer: It is better to start with liquids, then soup then solid foods.  _____ Eye patch should be worn until postoperative exam tomorrow.  ____ Solar shield eyeglasses should be worn for comfort in the sunlight/patch while sleeping  Resume all regular medications including aspirin or Coumadin if these were discontinued prior to surgery. You may shower, bathe, shave, or wash your hair. Tylenol may be taken for mild discomfort.  Call your doctor if you experience significant pain, nausea, or vomiting, fever > 101 or other signs of infection. [phone] or [phone] Specific instructions:  Follow-up Information    Eulogio Bear, MD Follow up.   Specialty:  Ophthalmology Why:  May 3 at 10:30am Contact information: [place] [address] [phone]

## 2017-05-28 ENCOUNTER — Other Ambulatory Visit: Payer: Self-pay | Admitting: Family Medicine

## 2017-06-12 ENCOUNTER — Other Ambulatory Visit: Payer: Self-pay | Admitting: Family Medicine

## 2017-06-12 NOTE — Telephone Encounter (Signed)
Last office visit 03/14/2017 with R. Baity.  Last refilled 05/09/2017 for #30 with no refills.  Ok to refill?

## 2017-06-13 MED ORDER — CLONAZEPAM 1 MG PO TABS: 0.5000 mg | ORAL_TABLET | Freq: Two times a day (BID) | ORAL | 0 refills | Status: DC

## 2017-07-14 ENCOUNTER — Other Ambulatory Visit: Payer: Self-pay | Admitting: Family Medicine

## 2017-07-14 MED ORDER — CLONAZEPAM 1 MG PO TABS: 0.5000 mg | ORAL_TABLET | Freq: Two times a day (BID) | ORAL | 0 refills | Status: DC

## 2017-07-14 NOTE — Telephone Encounter (Signed)
Last office visit 03/14/2017 with R. Baity for Abd Cramping.  Last refilled 06/13/2017 for #30 with no refills.  Ok to refill?

## 2017-07-28 ENCOUNTER — Other Ambulatory Visit: Payer: Self-pay | Admitting: Family Medicine

## 2017-08-12 ENCOUNTER — Other Ambulatory Visit: Payer: Self-pay | Admitting: Family Medicine

## 2017-08-14 NOTE — Telephone Encounter (Signed)
Electronic refill request Last refill 07/14/17 #30 Last office visit 03/14/17-acute

## 2017-08-15 ENCOUNTER — Other Ambulatory Visit: Payer: Self-pay | Admitting: Family Medicine

## 2017-08-15 MED ORDER — CLONAZEPAM 1 MG PO TABS: 0.5000 mg | ORAL_TABLET | Freq: Two times a day (BID) | ORAL | 0 refills | Status: DC

## 2017-08-27 ENCOUNTER — Other Ambulatory Visit: Payer: Self-pay | Admitting: Family Medicine

## 2017-09-20 ENCOUNTER — Other Ambulatory Visit: Payer: Self-pay

## 2017-09-20 MED ORDER — CLONAZEPAM 1 MG PO TABS: 0.5000 mg | ORAL_TABLET | Freq: Two times a day (BID) | ORAL | 0 refills | Status: DC

## 2017-09-20 NOTE — Telephone Encounter (Signed)
Last office visit 03/14/2017 with R. Baity for Abdominal Cramping. No future appointments.  Last refilled 08/15/2017 for #30 with no refills.

## 2017-10-20 ENCOUNTER — Other Ambulatory Visit: Payer: Self-pay

## 2017-10-20 MED ORDER — CLONAZEPAM 1 MG PO TABS: 0.5000 mg | ORAL_TABLET | Freq: Two times a day (BID) | ORAL | 0 refills | Status: DC

## 2017-10-20 NOTE — Telephone Encounter (Signed)
Last office visit 03/14/2017 with R. Baity for Abd Cramping.  No future appointments.  Last refilled 09/20/2017 for #30 with no refills.

## 2017-10-26 ENCOUNTER — Other Ambulatory Visit: Payer: Self-pay | Admitting: Family Medicine

## 2017-10-26 NOTE — Telephone Encounter (Signed)
Last office visit 03/14/2017 with R. Baity for Abd Cramping.  No future appointments.  Last CPE 12/08/2016.  Last refilled 07/28/2017 for #90 with no refills.  Ok to refill?

## 2017-11-19 ENCOUNTER — Other Ambulatory Visit: Payer: Self-pay

## 2017-11-20 ENCOUNTER — Telehealth: Payer: Self-pay | Admitting: Family Medicine

## 2017-11-20 MED ORDER — TRAZODONE HCL 50 MG PO TABS: 50.0000 mg | ORAL_TABLET | Freq: Every day | ORAL | 0 refills | Status: DC

## 2017-11-20 MED ORDER — CLONAZEPAM 1 MG PO TABS: 0.5000 mg | ORAL_TABLET | Freq: Two times a day (BID) | ORAL | 0 refills | Status: DC

## 2017-11-20 NOTE — Telephone Encounter (Signed)
Left message asking pt to call office      I would do the morning of 12/08/2017 for Dr. Diona Browner.   Butch Penny   Previous Messages    ----- Message -----  From: Darl Householder  Sent: 11/20/2017  9:48 AM EDT  To: Carter Kitten, CMA   Please let me know where I can work pt in Last cpx 11/15   ----- Message -----  From: Carter Kitten, CMA  Sent: 11/20/2017  9:20 AM EDT  To: Darl Householder   Please schedule MWV with Lattie Haw and CPE with Dr. Diona Browner for November.   Thanks,  Butch Penny

## 2017-11-20 NOTE — Telephone Encounter (Signed)
Last office visit 03/14/2017 with R. Baity for abdominal cramping. No future appointments. I have sent message to [name] asking her to schedule Catherine Young in November.  Last refilled clonazepam 10/20/2017 for #30 with no refills.  Trazadone 08/28/2017 for #90 with no refills.  Ok to refill?

## 2017-11-21 NOTE — Telephone Encounter (Signed)
11/15 to soon  Made appointment 11/22 with dr Diona Browner 11/20 with lisa Pt aware

## 2017-12-12 ENCOUNTER — Telehealth: Payer: Self-pay | Admitting: Family Medicine

## 2017-12-12 DIAGNOSIS — Z79899 Other long term (current) drug therapy: Secondary | ICD-10-CM

## 2017-12-12 DIAGNOSIS — M810 Age-related osteoporosis without current pathological fracture: Secondary | ICD-10-CM

## 2017-12-12 DIAGNOSIS — Z130 Encounter for screening for diseases of the blood and blood-forming organs and certain disorders involving the immune mechanism: Secondary | ICD-10-CM

## 2017-12-12 NOTE — Telephone Encounter (Signed)
-----   Message from Eustace Pen, LPN sent at [phone]  1:12 PM EST ----- Regarding: Labs 11/20 Lab orders needed. Thank you.  Insurance:  [facility] Medicare

## 2017-12-13 ENCOUNTER — Ambulatory Visit (INDEPENDENT_AMBULATORY_CARE_PROVIDER_SITE_OTHER): Payer: Medicare Other

## 2017-12-13 VITALS — BP 130/82 | HR 50 | Temp 97.6°F | Ht 65.5 in | Wt 114.5 lb

## 2017-12-13 DIAGNOSIS — Z23 Encounter for immunization: Secondary | ICD-10-CM

## 2017-12-13 DIAGNOSIS — Z0000 Encounter for general adult medical examination without abnormal findings: Secondary | ICD-10-CM

## 2017-12-13 DIAGNOSIS — M810 Age-related osteoporosis without current pathological fracture: Secondary | ICD-10-CM

## 2017-12-13 DIAGNOSIS — Z130 Encounter for screening for diseases of the blood and blood-forming organs and certain disorders involving the immune mechanism: Secondary | ICD-10-CM

## 2017-12-13 DIAGNOSIS — Z79899 Other long term (current) drug therapy: Secondary | ICD-10-CM | POA: Diagnosis not present

## 2017-12-13 LAB — COMPREHENSIVE METABOLIC PANEL
ALBUMIN: 4.3 g/dL (ref 3.5–5.2)
ALT: 10 U/L (ref 0–35)
AST: 20 U/L (ref 0–37)
Alkaline Phosphatase: 33 U/L — ABNORMAL LOW (ref 39–117)
BUN: 17 mg/dL (ref 6–23)
CHLORIDE: 101 meq/L (ref 96–112)
CO2: 32 mEq/L (ref 19–32)
CREATININE: 0.87 mg/dL (ref 0.40–1.20)
Calcium: 10 mg/dL (ref 8.4–10.5)
GFR: 65.91 mL/min (ref >60.00)
GLUCOSE: 95 mg/dL (ref 70–99)
Potassium: 4.4 mEq/L (ref 3.5–5.1)
Sodium: 140 mEq/L (ref 135–145)
Total Bilirubin: 0.6 mg/dL (ref 0.2–1.2)
Total Protein: 7.1 g/dL (ref 6.0–8.3)

## 2017-12-13 LAB — CBC WITH DIFFERENTIAL/PLATELET
Basophils Absolute: 0 10*3/uL (ref 0.0–0.1)
Basophils Relative: 1 % (ref 0.0–3.0)
EOS PCT: 3 % (ref 0.0–5.0)
Eosinophils Absolute: 0.1 10*3/uL (ref 0.0–0.7)
HCT: 40 % (ref 36.0–46.0)
Hemoglobin: 13.4 g/dL (ref 12.0–15.0)
Lymphocytes Relative: 39.8 % (ref 12.0–46.0)
Lymphs Abs: 1.8 10*3/uL (ref 0.7–4.0)
MCHC: 33.4 g/dL (ref 30.0–36.0)
MCV: 97.9 fl (ref 78.0–100.0)
MONOS PCT: 11.3 % (ref 3.0–12.0)
Monocytes Absolute: 0.5 10*3/uL (ref 0.1–1.0)
NEUTROS ABS: 2 10*3/uL (ref 1.4–7.7)
Neutrophils Relative %: 44.9 % (ref 43.0–77.0)
PLATELETS: 212 10*3/uL (ref 150.0–400.0)
RBC: 4.09 Mil/uL (ref 3.87–5.11)
RDW: 12.9 % (ref 11.5–15.5)
WBC: 4.5 10*3/uL (ref 4.0–10.5)

## 2017-12-13 LAB — VITAMIN D 25 HYDROXY (VIT D DEFICIENCY, FRACTURES): VITD: 53.49 ng/mL (ref 30.00–100.00)

## 2017-12-13 NOTE — Patient Instructions (Signed)
Catherine Young , Thank you for taking time to come for your Medicare Wellness Visit. I appreciate your ongoing commitment to your health goals. Please review the following plan we discussed and let me know if I can assist you in the future.   These are the goals we discussed: Goals    . Health Management     Starting 12/13/2017, I will continue to do water aerobics for 60 min 1 day per week and to drink at least 32 oz of water daily.        This is a list of the screening recommended for you and due dates:  Health Maintenance  Topic Date Due  . Tetanus Vaccine  11/23/2026*  . Flu Shot  Completed  . DEXA scan (bone density measurement)  Completed  . Pneumonia vaccines  Completed  *Topic was postponed. The date shown is not the original due date.   Preventive Care for Adults  A healthy lifestyle and preventive care can promote health and wellness. Preventive health guidelines for adults include the following key practices.  . A routine yearly physical is a good way to check with your health care provider about your health and preventive screening. It is a chance to share any concerns and updates on your health and to receive a thorough exam.  . Visit your dentist for a routine exam and preventive care every 6 months. Brush your teeth twice a day and floss once a day. Good oral hygiene prevents tooth decay and gum disease.  . The frequency of eye exams is based on your age, health, family medical history, use  of contact lenses, and other factors. Follow your health care provider's recommendations for frequency of eye exams.  . Eat a healthy diet. Foods like vegetables, fruits, whole grains, low-fat dairy products, and lean protein foods contain the nutrients you need without too many calories. Decrease your intake of foods high in solid fats, added sugars, and salt. Eat the right amount of calories for you. Get information about a proper diet from your health care provider, if  necessary.  . Regular physical exercise is one of the most important things you can do for your health. Most adults should get at least 150 minutes of moderate-intensity exercise (any activity that increases your heart rate and causes you to sweat) each week. In addition, most adults need muscle-strengthening exercises on 2 or more days a week.  Silver Sneakers may be a benefit available to you. To determine eligibility, you may visit the website: www.silversneakers.com or contact program at [phone] Mon-Fri between 8AM-8PM.   . Maintain a healthy weight. The body mass index (BMI) is a screening tool to identify possible weight problems. It provides an estimate of body fat based on height and weight. Your health care provider can find your BMI and can help you achieve or maintain a healthy weight.   For adults 20 years and older: ? A BMI below 18.5 is considered underweight. ? A BMI of 18.5 to 24.9 is normal. ? A BMI of 25 to 29.9 is considered overweight. ? A BMI of 30 and above is considered obese.   . Maintain normal blood lipids and cholesterol levels by exercising and minimizing your intake of saturated fat. Eat a balanced diet with plenty of fruit and vegetables. Blood tests for lipids and cholesterol should begin at age 54 and be repeated every 5 years. If your lipid or cholesterol levels are high, you are over 50, or you are at  high risk for heart disease, you may need your cholesterol levels checked more frequently. Ongoing high lipid and cholesterol levels should be treated with medicines if diet and exercise are not working.  . If you smoke, find out from your health care provider how to quit. If you do not use tobacco, please do not start.  . If you choose to drink alcohol, please do not consume more than 2 drinks per day. One drink is considered to be 12 ounces (355 mL) of beer, 5 ounces (148 mL) of wine, or 1.5 ounces (44 mL) of liquor.  . If you are 37-25 years old, ask your  health care provider if you should take aspirin to prevent strokes.  . Use sunscreen. Apply sunscreen liberally and repeatedly throughout the day. You should seek shade when your shadow is shorter than you. Protect yourself by wearing long sleeves, pants, a wide-brimmed hat, and sunglasses year round, whenever you are outdoors.  . Once a month, do a whole body skin exam, using a mirror to look at the skin on your back. Tell your health care provider of new moles, moles that have irregular borders, moles that are larger than a pencil eraser, or moles that have changed in shape or color.

## 2017-12-13 NOTE — Progress Notes (Signed)
Subjective:   Catherine Young is a 84 y.o. female who presents for Medicare Annual (Subsequent) preventive examination.  Review of Systems:  N/A Cardiac Risk Factors include: advanced age (>63men, >19 women);dyslipidemia     Objective:     Vitals: BP 130/82 (BP Location: Right Arm, Patient Position: Sitting, Cuff Size: Normal)   Pulse (!) 50   Temp 97.6 F (36.4 C) (Oral)   Ht 5' 5.5" (1.664 m) Comment: no shoes  Wt 114 lb 8 oz (51.9 kg)   SpO2 98%   BMI 18.76 kg/m   Body mass index is 18.76 kg/m.  Advanced Directives 12/13/2017 05/25/2017 11/22/2016  Does Patient Have a Medical Advance Directive? Yes Yes Yes  Type of Paramedic of [place];Living will [place];Living will [place];Living will  Does patient want to make changes to medical advance directive? - No - Patient declined -  Copy of [place] in Chart? No - copy requested No - copy requested No - copy requested    Tobacco Social History   Tobacco Use  Smoking Status Former Smoker  . Last attempt to quit: 01/24/1993  . Years since quitting: 24.9  Smokeless Tobacco Never Used  Tobacco Comment   quit 1995     Counseling given: No Comment: quit 1995   Clinical Intake:  Pre-visit preparation completed: Yes  Pain : No/denies pain Pain Score: 7      Nutritional Status: BMI of 19-24  Normal Nutritional Risks: None Diabetes: No  How often do you need to have someone help you when you read instructions, pamphlets, or other written materials from your doctor or pharmacy?: 1 - Never What is the last grade level you completed in school?: 12th grade + some college  Interpreter Needed?: No  Comments: pt lives with spouse Information entered by :: LPinson, LPN  Past Medical History:  Diagnosis Date  . Arthritis   . Basal cell carcinoma of tragus, left   . Cataract    right  . Complication of anesthesia    COULD'T MOVE  WHEN WAKING UP AFTER [place] COLON SURGERY  . Depression   . Hypertension   . Intestinal obstruction ([place])   . Osteoporosis    Past Surgical History:  Procedure Laterality Date  . ABDOMINAL HYSTERECTOMY     partial, for menorrhagia  . APPENDECTOMY    . CATARACT EXTRACTION W/PHACO Right 05/25/2017   Procedure: CATARACT EXTRACTION PHACO AND INTRAOCULAR LENS PLACEMENT (IOC);  Surgeon: Eulogio Bear, MD;  Location: [facility] ORS;  Service: Ophthalmology;  Laterality: Right;  fluid pack lot #  [id number]  exp  12/24/2018 [place]   00:39.4 AP%   9.4 CDE    3.71   . COLON SURGERY     INTESTINAL BLOCKAGE FROM SCAR TISSUE X 2   Family History  Problem Relation Age of Onset  . Arthritis Mother   . Colon cancer Mother   . Cancer Mother        colon  . Diabetes Father   . Arthritis Sister   . Colon cancer Sister   . Cancer Sister        colon  . Lung cancer Brother   . Cancer Brother        lung  . Arthritis Sister   . Colon cancer Sister   . Cancer Sister        colon   Social History   Socioeconomic History  . Marital status: Widowed  Spouse name: Not on file  . Number of children: Not on file  . Years of education: Not on file  . Highest education level: Not on file  Occupational History  . Not on file  Social Needs  . Financial resource strain: Not on file  . Food insecurity:    Worry: Not on file    Inability: Not on file  . Transportation needs:    Medical: Not on file    Non-medical: Not on file  Tobacco Use  . Smoking status: Former Smoker    Last attempt to quit: 01/24/1993    Years since quitting: 24.9  . Smokeless tobacco: Never Used  . Tobacco comment: quit 1995  Substance and Sexual Activity  . Alcohol use: Yes    Alcohol/week: 7.0 standard drinks    Types: 7 Glasses of wine per week    Comment: nightly glass of wine  . Drug use: No  . Sexual activity: Never  Lifestyle  . Physical activity:    Days per week: Not on file    Minutes per session: Not on  file  . Stress: Not on file  Relationships  . Social connections:    Talks on phone: Not on file    Gets together: Not on file    Attends religious service: Not on file    Active member of club or organization: Not on file    Attends meetings of clubs or organizations: Not on file    Relationship status: Not on file  Other Topics Concern  . Not on file  Social History Narrative  . Not on file    Outpatient Encounter Medications as of 12/13/2017  Medication Sig  . Calcium Carb-Cholecalciferol (CALCIUM 600/VITAMIN D3 PO) Take 1 tablet by mouth daily.  . Cholecalciferol (VITAMIN D3) 1000 UNITS CAPS Take 1,000 Units by mouth daily.   . clonazePAM (KLONOPIN) 1 MG tablet Take 0.5 tablets (0.5 mg total) by mouth 2 (two) times daily.  . Naphazoline HCl (CLEAR EYES OP) Place 2 drops into both eyes 2 (two) times daily as needed (for dry eyes).  . sertraline (ZOLOFT) 50 MG tablet TAKE 1 TABLET BY MOUTH AT BEDTIME  . traZODone (DESYREL) 50 MG tablet Take 1 tablet (50 mg total) by mouth at bedtime.  . [DISCONTINUED] clobetasol ointment (TEMOVATE) 3.01 % Apply 1 application topically 2 (two) times daily. (Patient taking differently: Apply 1 application topically 2 (two) times daily as needed (for eczema). )  . [DISCONTINUED] Ibuprofen-diphenhydrAMINE Cit (ADVIL PM PO) Take 1 tablet by mouth at bedtime as needed (for sleep).   No facility-administered encounter medications on file as of 12/13/2017.     Activities of Daily Living In your present state of health, do you have any difficulty performing the following activities: 12/13/2017  Hearing? N  Vision? N  Difficulty concentrating or making decisions? Y  Walking or climbing stairs? Y  Dressing or bathing? N  Doing errands, shopping? N  Preparing Food and eating ? N  Using the Toilet? N  In the past six months, have you accidently leaked urine? Y  Do you have problems with loss of bowel control? N  Managing your Medications? N  Managing  your Finances? N  Housekeeping or managing your Housekeeping? N  Some recent data might be hidden    Patient Care Team: Jinny Sanders, MD as PCP - General (Family Medicine) Eulogio Bear, MD as Consulting Physician (Ophthalmology)    Assessment:   This is a routine  wellness examination for [facility].  Exercise Activities and Dietary recommendations Exercise limited by: None identified  Goals    . Health Management     Starting 12/13/2017, I will continue to do water aerobics for 60 min 1 day per week and to drink at least 32 oz of water daily.        Fall Risk Fall Risk  12/13/2017 11/22/2016 07/14/2015 01/13/2015  Falls in the past year? 0 No No No   Depression Screen PHQ 2/9 Scores 12/13/2017 02/02/2017 01/05/2017 11/22/2016  PHQ - 2 Score 4 2 2 2   PHQ- 9 Score 12 6 7 7      Cognitive Function MMSE - Mini Mental State Exam 12/13/2017 11/22/2016  Orientation to time 5 5  Orientation to Place 5 5  Registration 3 3  Attention/ Calculation 0 0  Recall 3 3  Language- name 2 objects 0 0  Language- repeat 1 1  Language- follow 3 step command 3 2  Language- follow 3 step command-comments - unable to follow 1 step of 3 step command  Language- read & follow direction 0 0  Write a sentence 0 0  Copy design 0 0  Total score 20 19    .PLEASE NOTE: A Mini-Cog screen was completed. Maximum score is 20. A value of 0 denotes this part of [place] MMSE was not completed or the patient failed this part of the Mini-Cog screening.   Mini-Cog Screening Orientation to Time - Max 5 pts Orientation to Place - Max 5 pts Registration - Max 3 pts Recall - Max 3 pts Language Repeat - Max 1 pts Language Follow 3 Step Command - Max 3 pts     Immunization History  Administered Date(s) Administered  . Influenza,inj,quad, With Preservative 11/10/2016  . Influenza-Unspecified 11/17/2016, 10/24/2017  . Pneumococcal Conjugate-13 11/22/2016  . Pneumococcal Polysaccharide-23 12/13/2017      Screening Tests Health Maintenance  Topic Date Due  . TETANUS/TDAP  11/23/2026 (Originally 10/17/1950)  . INFLUENZA VACCINE  Completed  . DEXA SCAN  Completed  . PNA vac Low Risk Adult  Completed      Plan:   I have personally reviewed, addressed, and noted the following in the patient's chart:  A. Medical and social history B. Use of alcohol, tobacco or illicit drugs  C. Current medications and supplements D. Functional ability and status E.  Nutritional status F.  Physical activity G. Advance directives H. List of other physicians I.  Hospitalizations, surgeries, and ER visits in previous 12 months J.  [place] to include hearing, vision, cognitive, depression L. Referrals and appointments - none  In addition, I have reviewed and discussed with patient certain preventive protocols, quality metrics, and best practice recommendations. A written personalized care plan for preventive services as well as general preventive health recommendations were provided to patient.  See attached scanned questionnaire for additional information.   Signed,   Lindell Noe, MHA, BS, LPN Health Coach

## 2017-12-13 NOTE — Progress Notes (Signed)
PCP notes:   Health maintenance:  PPSV23 - administered  Abnormal screenings:   Hearing - failed  Hearing Screening   125Hz  250Hz  500Hz  1000Hz  2000Hz  3000Hz  4000Hz  6000Hz  8000Hz   Right ear:   40 40 40  0    Left ear:   40 40 40  0     Depression score: 12 Depression screen [facility] 2/9 12/13/2017 02/02/2017 01/05/2017 11/22/2016 09/20/2016  Decreased Interest 2 1 1 1 1   Down, Depressed, Hopeless 2 1 1 1 3   PHQ - 2 Score 4 2 2 2 4   Altered sleeping 2 1 2 2 3   Tired, decreased energy 2 2 1 1  0  Change in appetite 1 0 1 1 0  Feeling bad or failure about yourself  2 1 1 1 3   Trouble concentrating 0 0 0 0 0  Moving slowly or fidgety/restless 0 0 0 0 0  Suicidal thoughts 1 0 0 0 1  PHQ-9 Score 12 6 7 7 11   Difficult doing work/chores Not difficult at all Somewhat difficult Somewhat difficult Somewhat difficult Very difficult   Patient concerns:   Patient has requested an X-ray of her right knee due to increased pain. Patient will discuss with PCP at annual exam.  Nurse concerns:  None  Next PCP appt:   12/15/17 @ 1030

## 2017-12-14 NOTE — Progress Notes (Signed)
I reviewed health advisor's note, was available for consultation, and agree with documentation and plan.  

## 2017-12-15 ENCOUNTER — Ambulatory Visit (INDEPENDENT_AMBULATORY_CARE_PROVIDER_SITE_OTHER)
Admission: RE | Admit: 2017-12-15 | Discharge: 2017-12-15 | Disposition: A | Discharge status: Home / Self Care | Payer: Medicare Other | Source: Ambulatory Visit | Attending: Family Medicine | Admitting: Family Medicine

## 2017-12-15 ENCOUNTER — Encounter: Payer: Self-pay | Admitting: Family Medicine

## 2017-12-15 ENCOUNTER — Ambulatory Visit: Payer: Medicare Other | Admitting: Family Medicine

## 2017-12-15 VITALS — BP 150/70 | HR 52 | Temp 97.4°F | Ht 65.5 in | Wt 116.0 lb

## 2017-12-15 DIAGNOSIS — F331 Major depressive disorder, recurrent, moderate: Secondary | ICD-10-CM

## 2017-12-15 DIAGNOSIS — F5104 Psychophysiologic insomnia: Secondary | ICD-10-CM | POA: Diagnosis not present

## 2017-12-15 DIAGNOSIS — M25561 Pain in right knee: Secondary | ICD-10-CM | POA: Diagnosis not present

## 2017-12-15 DIAGNOSIS — M810 Age-related osteoporosis without current pathological fracture: Secondary | ICD-10-CM

## 2017-12-15 DIAGNOSIS — F411 Generalized anxiety disorder: Secondary | ICD-10-CM

## 2017-12-15 DIAGNOSIS — G8929 Other chronic pain: Secondary | ICD-10-CM

## 2017-12-15 DIAGNOSIS — Z0000 Encounter for general adult medical examination without abnormal findings: Secondary | ICD-10-CM | POA: Diagnosis not present

## 2017-12-15 MED ORDER — SERTRALINE HCL 100 MG PO TABS: 100.0000 mg | ORAL_TABLET | Freq: Every day | ORAL | 3 refills | Status: DC

## 2017-12-15 MED ORDER — DICLOFENAC SODIUM 1 % TD GEL: 4.0000 g | Freq: Four times a day (QID) | TRANSDERMAL | 3 refills | Status: DC

## 2017-12-15 NOTE — Assessment & Plan Note (Signed)
Likely POA of right knee. Eval with X-ray. Trial of  voltaren gel..  ORAL NSAIDS not recommended given age, decreased GFR.

## 2017-12-15 NOTE — Assessment & Plan Note (Signed)
Good control with trazodone at night.

## 2017-12-15 NOTE — Assessment & Plan Note (Signed)
Intolerant of alendronate. Not interested in treatment with different med at thie time Recommend weight bearing exercise, calcium in diet and vit D supplement 400 IU 1-2 times daily.

## 2017-12-15 NOTE — Assessment & Plan Note (Signed)
Increase sertraline to 100 mg daily. Limit clonazepam use, great job weaning down.

## 2017-12-15 NOTE — Progress Notes (Signed)
Subjective:    Patient ID: Catherine Young, female    DOB: 1931-02-22, 84 y.o.   MRN: [id number]  HPI The patient presents for  complete physical and review of chronic health problems. He/She also has the following acute concerns today: increase pain in right knee.  She has history of OA.  No known fall or injury.  Worse in last  6 months.  Pain is in  anterolateral knee.. Radiates up and down leg. No numbness, no weakness, no change in low back pain. Pain is greatest  going down stairs.  Knee is going out off and on.  Going to water aerobic but knee causes her issues during it.  tylenol does not helps.   The patient saw Candis Musa, LPN for medicare wellness. Note reviewed in detail and important notes copied below.  Health maintenance:  PPSV23 - administered.. She had soreness at the site  Abnormal screenings:   Hearing - failed             Hearing Screening   125Hz  250Hz  500Hz  1000Hz  2000Hz  3000Hz  4000Hz  6000Hz  8000Hz   Right ear:   40 40 40  0    Left ear:   40 40 40  0     Depression score: 12 Depression screen [facility] 2/9 12/13/2017 02/02/2017 01/05/2017 11/22/2016 09/20/2016  Decreased Interest 2 1 1 1 1   Down, Depressed, Hopeless 2 1 1 1 3   PHQ - 2 Score 4 2 2 2 4   Altered sleeping 2 1 2 2 3   Tired, decreased energy 2 2 1 1  0  Change in appetite 1 0 1 1 0  Feeling bad or failure about yourself  2 1 1 1 3   Trouble concentrating 0 0 0 0 0  Moving slowly or fidgety/restless 0 0 0 0 0  Suicidal thoughts 1 0 0 0 1  PHQ-9 Score 12 6 7 7 11   Difficult doing work/chores Not difficult at all Somewhat difficult Somewhat difficult Somewhat difficult Very difficult   Patient concerns:   Patient has requested an X-ray of her right knee due to increased pain. Patient will discuss with PCP at annual exam.   12/15/17 Today:  MDD, recurrent, GAD, chronic insomnia: She is not well controlled with depression.. On sertraline 50 mg daily. HAs weaned down  to 1/2 tab BID of clonazepam.  Using trazodone for sleep at night.   Spinal stenosis: Followed by Dr. Sharlet Salina getting ESI.   Reviewed labs in detail with pt.  Diet:  Exercise:   Social History /Family History/Past Medical History reviewed in detail and updated in EMR if needed. Blood pressure (!) 150/70, pulse (!) 52, temperature (!) 97.4 F (36.3 C), temperature source Oral, height 5' 5.5" (1.664 m), weight 116 lb (52.6 kg).  Review of Systems  Constitutional: Negative for fatigue and fever.  HENT: Negative for congestion.   Eyes: Negative for pain.  Respiratory: Negative for cough and shortness of breath.   Cardiovascular: Negative for chest pain, palpitations and leg swelling.  Gastrointestinal: Negative for abdominal pain.  Genitourinary: Negative for dysuria and vaginal bleeding.  Musculoskeletal: Positive for arthralgias and joint swelling. Negative for back pain.  Neurological: Negative for syncope, light-headedness and headaches.  Psychiatric/Behavioral: Positive for dysphoric mood.       Objective:   Physical Exam  Constitutional: Vital signs are normal. She appears well-developed and well-nourished. She is cooperative.  Non-toxic appearance. She does not appear ill. No distress.  Elderly female in NAD, kyphosis  HENT:  Head: Normocephalic.  Right Ear: Hearing, tympanic membrane, external ear and ear canal normal.  Left Ear: Hearing, tympanic membrane, external ear and ear canal normal.  Nose: Nose normal.  Eyes: Pupils are equal, round, and reactive to light. Conjunctivae, EOM and lids are normal. Lids are everted and swept, no foreign bodies found.  Neck: Trachea normal and normal range of motion. Neck supple. Carotid bruit is not present. No thyroid mass and no thyromegaly present.  Cardiovascular: Normal rate, regular rhythm, S1 normal, S2 normal, normal heart sounds and intact distal pulses. Exam reveals no gallop.  No murmur heard. Pulmonary/Chest: Effort  normal and breath sounds normal. No respiratory distress. She has no wheezes. She has no rhonchi. She has no rales.  Abdominal: Soft. Normal appearance and bowel sounds are normal. She exhibits no distension, no fluid wave, no abdominal bruit and no mass. There is no hepatosplenomegaly. There is tenderness in the suprapubic area. There is no rigidity, no rebound, no guarding and no CVA tenderness. No hernia.  Musculoskeletal:       Right knee: She exhibits decreased range of motion and swelling. She exhibits no effusion, no bony tenderness and normal meniscus. Tenderness found. Lateral joint line tenderness noted. No MCL, no LCL and no patellar tendon tenderness noted.  Lymphadenopathy:    She has no cervical adenopathy.    She has no axillary adenopathy.  Neurological: She is alert. She has normal strength. No cranial nerve deficit or sensory deficit.  Skin: Skin is warm, dry and intact. No rash noted.  Psychiatric: Her speech is normal and behavior is normal. Judgment normal. Her mood appears not anxious. Cognition and memory are normal. She does not exhibit a depressed mood.          Assessment & Plan:  The patient's preventative maintenance and recommended screening tests for an annual wellness exam were reviewed in full today. Brought up to date unless services declined.  Counselled on the importance of diet, exercise, and its role in overall health and mortality. The patient's FH and SH was reviewed, including their home life, tobacco status, and drug and alcohol status.   Vaccines: reviewed Pap/DVE:  Not indicated Mammo: not indicated Bone Density: 2018 DEXA.. Osteoporosis in hip started on alendronate weekly  12/2016 but had severe SE. Taking ca and Vit D She doe not want new med at this time. Colon:  Not indicated Smoking Status: none ETOH/ drug JJH:ERDE

## 2017-12-15 NOTE — Patient Instructions (Addendum)
We will call you with X-ray results.  Can try Voltaren/ diclofenac gel for knee pain.  Increase sertraline back up to 100 mg daily.

## 2017-12-18 ENCOUNTER — Telehealth: Payer: Self-pay | Admitting: *Deleted

## 2017-12-18 NOTE — Telephone Encounter (Signed)
Pt contacted office requesting knee xray results. pls advise

## 2017-12-19 ENCOUNTER — Other Ambulatory Visit: Payer: Self-pay

## 2017-12-19 MED ORDER — CLONAZEPAM 1 MG PO TABS: 0.5000 mg | ORAL_TABLET | Freq: Two times a day (BID) | ORAL | 0 refills | Status: DC

## 2017-12-19 NOTE — Telephone Encounter (Signed)
Name of Medication: clonazepam 1 mg Name of Pharmacy: [facility] [address] Last Fill or Written Date and Quantity: # 30 on 11/20/17 Last Office Visit and Type: 12/15/17 annual Next Office Visit and Type: 01/16/18 4 wk FU appt

## 2017-12-19 NOTE — Telephone Encounter (Signed)
See phone note

## 2017-12-25 NOTE — Progress Notes (Signed)
I reviewed health advisor's note, was available for consultation, and agree with documentation and plan.  

## 2018-01-16 ENCOUNTER — Encounter: Payer: Self-pay | Admitting: Family Medicine

## 2018-01-16 ENCOUNTER — Ambulatory Visit: Payer: Medicare Other | Admitting: Family Medicine

## 2018-01-16 VITALS — BP 160/70 | HR 53 | Temp 97.6°F | Ht 65.5 in | Wt 114.5 lb

## 2018-01-16 DIAGNOSIS — M25561 Pain in right knee: Secondary | ICD-10-CM

## 2018-01-16 DIAGNOSIS — F411 Generalized anxiety disorder: Secondary | ICD-10-CM | POA: Diagnosis not present

## 2018-01-16 DIAGNOSIS — G8929 Other chronic pain: Secondary | ICD-10-CM

## 2018-01-16 DIAGNOSIS — F331 Major depressive disorder, recurrent, moderate: Secondary | ICD-10-CM

## 2018-01-16 DIAGNOSIS — F5104 Psychophysiologic insomnia: Secondary | ICD-10-CM

## 2018-01-16 MED ORDER — TRAZODONE HCL 100 MG PO TABS: 100.0000 mg | ORAL_TABLET | Freq: Every day | ORAL | 0 refills | Status: DC

## 2018-01-16 MED ORDER — CLONAZEPAM 1 MG PO TABS: 0.5000 mg | ORAL_TABLET | Freq: Two times a day (BID) | ORAL | 0 refills | Status: DC

## 2018-01-16 NOTE — Assessment & Plan Note (Signed)
Improving but not ideal... Increase traozdone to 100 mg daily.

## 2018-01-16 NOTE — Patient Instructions (Addendum)
Increase trazodone to  100 mg at bedtime.  Continue sertraline at current dose.  Continue clonazepam.  Get blood pressure cuff, check BP at home x 2 weeks daily to every other day. If continuing to run > 150/90  Repetitively.. call.

## 2018-01-16 NOTE — Assessment & Plan Note (Signed)
Improving on current regimen. Trying to wean down clonazepam.

## 2018-01-16 NOTE — Assessment & Plan Note (Signed)
Improved with diclofenac gel. Pt not interested in steroid injection referral at this time.

## 2018-01-16 NOTE — Progress Notes (Signed)
Subjective:    Patient ID: Catherine Young, female    DOB: August 22, 1931, 84 y.o.   MRN: [id number]  HPI    84 year old female presents for a 4 week follow up on  MDD, GAD, chronic insomnia At last OV she  Repo.rted poor control of mood. Sertraline was increased to 100 mg at bedtime and She continued trazodone for sleep.  She had weaned down to using clonazepam 1/2 tab BID.   Today she reports she has had some improvement in her mood, slightly  She is sleeping some better at night. Falling asleep quicker at night, last week slept all night for the first time in a while.  No napping. No SE.  Depression screen [facility] 2/9 01/16/2018 12/13/2017 02/02/2017  Decreased Interest 2 2 1   Down, Depressed, Hopeless 2 2 1   PHQ - 2 Score 4 4 2   Altered sleeping 1 2 1   Tired, decreased energy 1 2 2   Change in appetite 0 1 0  Feeling bad or failure about yourself  2 2 1   Trouble concentrating 1 0 0  Moving slowly or fidgety/restless 0 0 0  Suicidal thoughts 0 1 0  PHQ-9 Score 9 12 6   Difficult doing work/chores - Not difficult at all Somewhat difficult     Her right knee has improved with diclofenac gel... pain is now at a 5 down rom a 10.  BP Readings from Last 3 Encounters:  01/16/18 (!) 154/68  12/15/17 (!) 150/70  12/13/17 130/82     Blood pressure (!) 154/68, pulse (!) 53, temperature 97.6 F (36.4 C), temperature source Oral, height 5' 5.5" (1.664 m), weight 114 lb 8 oz (51.9 kg), SpO2 (!) 57 %. Social History /Family History/Past Medical History reviewed in detail and updated in EMR if needed.  Review of Systems  Constitutional: Negative for fatigue and fever.  HENT: Negative for ear pain.   Eyes: Negative for pain.  Respiratory: Negative for chest tightness and shortness of breath.   Cardiovascular: Negative for chest pain, palpitations and leg swelling.  Gastrointestinal: Negative for abdominal pain.  Genitourinary: Negative for dysuria.       Objective:   Physical  Exam Constitutional:      General: She is not in acute distress.    Appearance: Normal appearance. She is well-developed. She is not ill-appearing or toxic-appearing.     Comments: Elderly female in NAD, kyphosis  HENT:     Head: Normocephalic.     Right Ear: Hearing, tympanic membrane, ear canal and external ear normal.     Left Ear: Hearing, tympanic membrane, ear canal and external ear normal.     Nose: Nose normal.  Eyes:     General: Lids are normal. Lids are everted, no foreign bodies appreciated.     Conjunctiva/sclera: Conjunctivae normal.     Pupils: Pupils are equal, round, and reactive to light.  Neck:     Musculoskeletal: Normal range of motion and neck supple.     Thyroid: No thyroid mass or thyromegaly.     Vascular: No carotid bruit.     Trachea: Trachea normal.  Cardiovascular:     Rate and Rhythm: Normal rate and regular rhythm.     Heart sounds: Normal heart sounds, S1 normal and S2 normal. No murmur. No gallop.   Pulmonary:     Effort: Pulmonary effort is normal. No respiratory distress.     Breath sounds: Normal breath sounds. No wheezing, rhonchi or rales.  Abdominal:  General: Bowel sounds are normal. There is no distension or abdominal bruit.     Palpations: Abdomen is soft. Abdomen is not rigid. There is no fluid wave or mass.     Tenderness: There is no guarding or rebound.     Hernia: No hernia is present.  Musculoskeletal:     Right knee: She exhibits decreased range of motion and swelling. She exhibits no effusion, no bony tenderness and normal meniscus. Tenderness found. Lateral joint line tenderness noted. No MCL, no LCL and no patellar tendon tenderness noted.  Lymphadenopathy:     Cervical: No cervical adenopathy.  Skin:    General: Skin is warm and dry.     Findings: No rash.  Neurological:     Mental Status: She is alert.     Cranial Nerves: No cranial nerve deficit.     Sensory: No sensory deficit.  Psychiatric:        Mood and Affect:  Mood is not anxious or depressed.        Speech: Speech normal.        Behavior: Behavior normal. Behavior is cooperative.        Judgment: Judgment normal.           Assessment & Plan:

## 2018-02-26 ENCOUNTER — Other Ambulatory Visit: Payer: Self-pay | Admitting: Family Medicine

## 2018-03-12 ENCOUNTER — Telehealth: Payer: Self-pay

## 2018-03-12 NOTE — Telephone Encounter (Signed)
Pt last seen 01/16/18 and pt rt knee has worsened and pt wants appt wit Dr Lorelei Pont that Dr Diona Browner had offered Dec 2019. Robin scheduled appt for pt with Dr Lorelei Pont on 03/19/18. FYI to Dr Lorelei Pont.

## 2018-03-17 NOTE — Progress Notes (Signed)
Dr. Frederico Hamman T. Clee Pandit, MD, [facility] [place], [address] Phone: [phone] Fax: [phone]  03/19/2018  Patient: Catherine Young, MRN: [id number], DOB: [birth year], 85 y.o.  Primary Physician:  Jinny Sanders, MD   Chief Complaint  Patient presents with  . Knee Pain    Right   Subjective:   Catherine Young is a 85 y.o. very pleasant female patient who presents with the following:  Pleasant patient with a known history of moderate knee OA who presents with ongoing R knee pain.  She has some significant spinal stenosis also, and this is causing her some impairment right now.  Globally this along with her knee pain is making her life little bit more difficult, particularly in the morning.  She is got a have an epidural steroid injection from Dr. Ashley Mariner upcoming.  She had a pain, more on the medial side of her knee.  She is having a minimal effusion and some pain with flexion.  Past Medical History, Surgical History, Social History, Family History, Problem List, Medications, and Allergies have been reviewed and updated if relevant.  Patient Active Problem List   Diagnosis Date Noted  . Chronic pain of right knee 12/15/2017  . Acute cystitis without hematuria 02/28/2017  . Chronic insomnia 01/05/2017  . Moderate recurrent major depression ([place]) 09/20/2016  . History of intestinal obstruction 01/13/2015  . Osteoporosis 01/13/2015  . GAD (generalized anxiety disorder) 01/13/2015  . Arthritis 01/13/2015    Past Medical History:  Diagnosis Date  . Arthritis   . Basal cell carcinoma of tragus, left   . Cataract    right  . Complication of anesthesia    COULD'T MOVE WHEN WAKING UP AFTER Long Neck COLON SURGERY  . Depression   . Hypertension   . Intestinal obstruction ([place])   . Osteoporosis     Past Surgical History:  Procedure Laterality Date  . ABDOMINAL HYSTERECTOMY     partial, for menorrhagia   . APPENDECTOMY    . CATARACT EXTRACTION W/PHACO Right 05/25/2017   Procedure: CATARACT EXTRACTION PHACO AND INTRAOCULAR LENS PLACEMENT (IOC);  Surgeon: Eulogio Bear, MD;  Location: [facility] ORS;  Service: Ophthalmology;  Laterality: Right;  fluid pack lot #  [id number]  exp  12/24/2018 [place]   00:39.4 AP%   9.4 CDE    3.71   . COLON SURGERY     INTESTINAL BLOCKAGE FROM SCAR TISSUE X 2    Social History   Socioeconomic History  . Marital status: Widowed    Spouse name: Not on file  . Number of children: Not on file  . Years of education: Not on file  . Highest education level: Not on file  Occupational History  . Not on file  Social Needs  . Financial resource strain: Not on file  . Food insecurity:    Worry: Not on file    Inability: Not on file  . Transportation needs:    Medical: Not on file    Non-medical: Not on file  Tobacco Use  . Smoking status: Former Smoker    Last attempt to quit: 01/24/1993    Years since quitting: 25.1  . Smokeless tobacco: Never Used  . Tobacco comment: quit 1995  Substance and Sexual Activity  . Alcohol use: Yes    Alcohol/week: 7.0 standard drinks    Types: 7 Glasses of wine per week    Comment: nightly glass of wine  . Drug  use: No  . Sexual activity: Never  Lifestyle  . Physical activity:    Days per week: Not on file    Minutes per session: Not on file  . Stress: Not on file  Relationships  . Social connections:    Talks on phone: Not on file    Gets together: Not on file    Attends religious service: Not on file    Active member of club or organization: Not on file    Attends meetings of clubs or organizations: Not on file    Relationship status: Not on file  . Intimate partner violence:    Fear of current or ex partner: Not on file    Emotionally abused: Not on file    Physically abused: Not on file    Forced sexual activity: Not on file  Other Topics Concern  . Not on file  Social History Narrative  . Not on file     Family History  Problem Relation Age of Onset  . Arthritis Mother   . Colon cancer Mother   . Cancer Mother        colon  . Diabetes Father   . Arthritis Sister   . Colon cancer Sister   . Cancer Sister        colon  . Lung cancer Brother   . Cancer Brother        lung  . Arthritis Sister   . Colon cancer Sister   . Cancer Sister        colon    No Known Allergies  Medication list reviewed and updated in full in [place].  GEN: No fevers, chills. Nontoxic. Primarily MSK c/o today. MSK: Detailed in the HPI GI: tolerating PO intake without difficulty Neuro: No numbness, parasthesias, or tingling associated. Otherwise the pertinent positives of the ROS are noted above.   Objective:   BP (!) 160/60   Pulse (!) 56   Temp 97.6 F (36.4 C) (Oral)   Ht 5' 5.5" (1.664 m)   Wt 114 lb 8 oz (51.9 kg)   BMI 18.76 kg/m    GEN: WDWN, NAD, Non-toxic, Alert & Oriented x 3 HEENT: Atraumatic, Normocephalic.  Ears and Nose: No external deformity. EXTR: No clubbing/cyanosis/edema NEURO: Normal gait.  PSYCH: Normally interactive. Conversant. Not depressed or anxious appearing.  Calm demeanor.    She has minimal effusion.  She has no significant pain with patellar motion or loading the patella facets.  She has quite notable medial joint space tenderness, more notable in the lateral joint line.  Stable to varus and valgus stress.  ACL and PCL are intact.  She does have some pain with McMurray's and flexion pinch.  Radiology: Dg Knee Complete 4 Views Right  Result Date: 12/15/2017 CLINICAL DATA:  Chronic right knee pain. EXAM: RIGHT KNEE - COMPLETE 4+ VIEW COMPARISON:  Radiographs of July 16, 2014. FINDINGS: No fracture or dislocation is noted. Mild suprapatellar joint effusion is noted. Moderate narrowing of lateral joint space is noted. IMPRESSION: Moderate osteoarthritis is noted laterally. Mild suprapatellar joint effusion. No fracture or dislocation is noted.  Electronically Signed   By: Marijo Conception, M.D.   On: 12/15/2017 17:35   Assessment and Plan:   Primary osteoarthritis of right knee  Known significant osteoarthritis of the right knee, moderate in character, she also has some significant flared up spinal stenosis.  She is using some Tiger balm topically, she thinks this works better than Voltaren gel.  She also using some Tylenol every once in a while.  I will do a intra-articular knee injection for today see if this can calm down her arthritis flare.  Knee Injection, R Date of procedure: 03/19/2018 Patient verbally consented to procedure. Risks (including potential rare risk of infection), benefits, and alternatives explained. Sterilely prepped with Chloraprep. Ethyl cholride used for anesthesia. 8 cc Lidocaine 1% mixed with 2 mL Depo-Medrol 40 mg injected using the anteromedial approach without difficulty. No complications with procedure and tolerated well. Patient had decreased pain post-injection. Medication: 2 mL of Depo-Medrol 40 mg, equaling Depo-Medrol 80 mg total   Follow-up: prn  Signed,  Paymon Rosensteel T. Magdalen Cabana, MD   Outpatient Encounter Medications as of 03/19/2018  Medication Sig  . Calcium Carb-Cholecalciferol (CALCIUM 600/VITAMIN D3 PO) Take 1 tablet by mouth daily.  . Cholecalciferol (VITAMIN D3) 1000 UNITS CAPS Take 1,000 Units by mouth daily.   . clonazePAM (KLONOPIN) 1 MG tablet Take 0.5 tablets (0.5 mg total) by mouth 2 (two) times daily.  . Naphazoline HCl (CLEAR EYES OP) Place 2 drops into both eyes 2 (two) times daily as needed (for dry eyes).  . sertraline (ZOLOFT) 100 MG tablet Take 1 tablet (100 mg total) by mouth at bedtime.  . traZODone (DESYREL) 100 MG tablet Take 1 tablet (100 mg total) by mouth at bedtime.  . [DISCONTINUED] diclofenac sodium (VOLTAREN) 1 % GEL Apply 4 g topically 4 (four) times daily.   No facility-administered encounter medications on file as of 03/19/2018.

## 2018-03-19 ENCOUNTER — Encounter: Payer: Self-pay | Admitting: Family Medicine

## 2018-03-19 ENCOUNTER — Ambulatory Visit: Payer: Medicare Other | Admitting: Family Medicine

## 2018-03-19 VITALS — BP 160/60 | HR 56 | Temp 97.6°F | Ht 65.5 in | Wt 114.5 lb

## 2018-03-19 DIAGNOSIS — M1711 Unilateral primary osteoarthritis, right knee: Secondary | ICD-10-CM | POA: Diagnosis not present

## 2018-03-19 MED ORDER — METHYLPREDNISOLONE ACETATE 40 MG/ML IJ SUSP
80.0000 mg | Freq: Once | INTRAMUSCULAR | Status: AC
  Administered 2018-03-19: 80 mg via INTRA_ARTICULAR

## 2018-03-19 NOTE — Addendum Note (Signed)
Addended by: Carter Kitten on: 03/19/2018 02:58 PM   Modules accepted: Orders

## 2018-04-11 ENCOUNTER — Other Ambulatory Visit: Payer: Self-pay

## 2018-04-11 MED ORDER — CLONAZEPAM 1 MG PO TABS: 0.5000 mg | ORAL_TABLET | Freq: Two times a day (BID) | ORAL | 0 refills | Status: DC

## 2018-04-11 MED ORDER — TRAZODONE HCL 100 MG PO TABS: 100.0000 mg | ORAL_TABLET | Freq: Every day | ORAL | 0 refills | Status: DC

## 2018-04-16 ENCOUNTER — Telehealth: Payer: Self-pay | Admitting: Family Medicine

## 2018-04-16 NOTE — Telephone Encounter (Signed)
Left message asking pt to call office please r/s appointment   Offer phone visit

## 2018-04-17 ENCOUNTER — Ambulatory Visit: Payer: Medicare Other | Admitting: Family Medicine

## 2018-06-27 ENCOUNTER — Telehealth: Payer: Self-pay | Admitting: Family Medicine

## 2018-06-27 NOTE — Telephone Encounter (Signed)
Last office visit 03/19/2018 for knee pain with Dr. Lorelei Pont.  When last seen by Dr. Diona Browner, on 01/16/2018  AVS states to follow up in 3 months.  CPE scheduled for 01/01/2019.  Ok to refill?

## 2018-06-28 NOTE — Telephone Encounter (Signed)
Schedule 6 month virtual OV for mood and insomnia follow up. Will refill once.

## 2018-07-08 ENCOUNTER — Other Ambulatory Visit: Payer: Self-pay

## 2018-07-09 ENCOUNTER — Other Ambulatory Visit: Payer: Self-pay | Admitting: Family Medicine

## 2018-07-09 MED ORDER — TRAZODONE HCL 100 MG PO TABS: 100.0000 mg | ORAL_TABLET | Freq: Every evening | ORAL | 0 refills | Status: DC | PRN

## 2018-07-09 NOTE — Telephone Encounter (Signed)
Last filled 04-11-18 Last OV 03-19-18 Next OV 01-01-19 [facility] [address]

## 2018-07-09 NOTE — Telephone Encounter (Signed)
Pt called checking on her rx [facility] number [phone]

## 2018-07-19 NOTE — Telephone Encounter (Signed)
Appointment 07/31/2018 Pt aware

## 2018-07-23 ENCOUNTER — Telehealth: Payer: Self-pay

## 2018-07-23 NOTE — Telephone Encounter (Signed)
Pt left v/m wanting to know if 07/31/18 at 11 AM is an in office visit or virtual visit. Per appt note the 07/31/18 is a virtual visit. Unable to reach pt to let her know.

## 2018-07-24 NOTE — Telephone Encounter (Signed)
Patient aware doxy appointment

## 2018-07-31 ENCOUNTER — Other Ambulatory Visit: Payer: Self-pay

## 2018-07-31 ENCOUNTER — Encounter: Payer: Self-pay | Admitting: Family Medicine

## 2018-07-31 ENCOUNTER — Ambulatory Visit (INDEPENDENT_AMBULATORY_CARE_PROVIDER_SITE_OTHER): Payer: Medicare Other | Admitting: Family Medicine

## 2018-07-31 DIAGNOSIS — F411 Generalized anxiety disorder: Secondary | ICD-10-CM | POA: Diagnosis not present

## 2018-07-31 DIAGNOSIS — R194 Change in bowel habit: Secondary | ICD-10-CM | POA: Diagnosis not present

## 2018-07-31 DIAGNOSIS — F331 Major depressive disorder, recurrent, moderate: Secondary | ICD-10-CM | POA: Diagnosis not present

## 2018-07-31 NOTE — Assessment & Plan Note (Signed)
Start fiber and water for regularity.  Can try probiotic as well.

## 2018-07-31 NOTE — Assessment & Plan Note (Signed)
Well controlled. Continue current medication.  

## 2018-07-31 NOTE — Patient Instructions (Signed)
Start benefiber daily. ONcrease water as well.  Can try probiotic for regularity of stool.

## 2018-07-31 NOTE — Assessment & Plan Note (Signed)
Moderate control. Will try to go back to pool and increase walking outside.  Continue current meds.

## 2018-07-31 NOTE — Progress Notes (Signed)
VIRTUAL VISIT Due to national recommendations of social distancing due to [place] 19, a virtual visit is felt to be most appropriate for this patient at this time.   I connected with the patient on 07/31/18 at 11:00 AM EDT by virtual telehealth platform and verified that I am speaking with the correct person using two identifiers.   I discussed the limitations, risks, security and privacy concerns of performing an evaluation and management service by  virtual telehealth platform and the availability of in person appointments. I also discussed with the patient that there may be a patient responsible charge related to this service. The patient expressed understanding and agreed to proceed.  Patient location: Home Provider Location: [facility] Participants: Eliezer Lofts and Lorette Ang   Chief Complaint  Patient presents with  . Follow-up    Mood & Insomnia    History of Present Illness: 85 year old female presents for follow up GAD and chronic insomnia  GAD: Moderate control on sertraline 100 mg daily. She does not want to increase this.  She has been isolated with COVID.Marland Kitchen not able to socialize.  She plans to try to go to pool tommorow. Trying to limit klonopin use to 1/2 tab BID. Using trazodone at night for sleep... this is helping a lot 6 hours night. Walking with cane. GAD 7 : Generalized Anxiety Score 07/31/2018  Nervous, Anxious, on Edge 3  Control/stop worrying 1  Worry too much - different things 3  Trouble relaxing 2  Restless 0  Easily annoyed or irritable 1  Afraid - awful might happen 3  Total GAD 7 Score 13  Anxiety Difficulty Very difficult    Depression screen [facility] 2/9 07/31/2018 01/16/2018 12/13/2017 02/02/2017 01/05/2017  Decreased Interest 2 2 2 1 1   Down, Depressed, Hopeless 2 2 2 1 1   PHQ - 2 Score 4 4 4 2 2   Altered sleeping 0 1 2 1 2   Tired, decreased energy 0 1 2 2 1   Change in appetite 2 0 1 0 1  Feeling bad or failure about yourself  3 2 2  1 1   Trouble concentrating 0 1 0 0 0  Moving slowly or fidgety/restless 0 0 0 0 0  Suicidal thoughts 0 0 1 0 0  PHQ-9 Score 9 9 12 6 7   Difficult doing work/chores Very difficult - Not difficult at all Somewhat difficult Somewhat difficult     She is having BMs every other day, in between has some stool leakage, thick pellet in underware.  COVID 19 screen No recent travel or known exposure to COVID19 The patient denies respiratory symptoms of COVID 19 at this time.  The importance of social distancing was discussed today.   Review of Systems  Constitutional: Negative for chills and fever.  HENT: Negative for congestion and ear pain.   Eyes: Negative for pain and redness.  Respiratory: Negative for cough and shortness of breath.   Cardiovascular: Negative for chest pain, palpitations and leg swelling.  Gastrointestinal: Negative for abdominal pain, blood in stool, constipation, diarrhea, nausea and vomiting.  Genitourinary: Negative for dysuria.  Musculoskeletal: Positive for back pain. Negative for falls and myalgias.  Skin: Negative for rash.  Neurological: Negative for dizziness.  Psychiatric/Behavioral: Positive for depression and memory loss. Negative for hallucinations, substance abuse and suicidal ideas. The patient is nervous/anxious. The patient does not have insomnia.       Past Medical History:  Diagnosis Date  . Arthritis   . Basal cell  carcinoma of tragus, left   . Cataract    right  . Complication of anesthesia    COULD'T MOVE WHEN WAKING UP AFTER [place] COLON SURGERY  . Depression   . Hypertension   . Intestinal obstruction ([place])   . Osteoporosis     reports that she quit smoking about 25 years ago. She has never used smokeless tobacco. She reports current alcohol use of about 7.0 standard drinks of alcohol per week. She reports that she does not use drugs.   Current Outpatient Medications:  .  Calcium Carb-Cholecalciferol (CALCIUM 600/VITAMIN D3 PO), Take 1  tablet by mouth daily., Disp: , Rfl:  .  Cholecalciferol (VITAMIN D3) 50 MCG (2000 UT) capsule, Take 2,000 Units by mouth daily., Disp: , Rfl:  .  clonazePAM (KLONOPIN) 1 MG tablet, TAKE 1/2 TABLET(0.5 MG) BY MOUTH TWICE DAILY, Disp: 90 tablet, Rfl: 0 .  sertraline (ZOLOFT) 100 MG tablet, Take 1 tablet (100 mg total) by mouth at bedtime., Disp: 90 tablet, Rfl: 3 .  traZODone (DESYREL) 100 MG tablet, Take 1 tablet (100 mg total) by mouth at bedtime as needed for sleep., Disp: 90 tablet, Rfl: 0   Observations/Objective: Blood pressure 128/75, pulse (!) 55, temperature 98 F (36.7 C), temperature source Oral, height 5' 5.5" (1.664 m), weight 111 lb (50.3 kg).  Physical Exam  Physical Exam Constitutional:      General: The patient is not in acute distress. Pulmonary:     Effort: Pulmonary effort is normal. No respiratory distress.  Neurological:     Mental Status: The patient is alert and oriented to person, place, and time.  Psychiatric:        Mood and Affect: Mood normal.        Behavior: Behavior normal.   Assessment and Plan Moderate recurrent major depression (HCC) Moderate control. Will try to go back to pool and increase walking outside.  Continue current meds.  GAD (generalized anxiety disorder) Well controlled. Continue current medication.   Change in stool habits Start fiber and water for regularity.  Can try probiotic as well.     I discussed the assessment and treatment plan with the patient. The patient was provided an opportunity to ask questions and all were answered. The patient agreed with the plan and demonstrated an understanding of the instructions.   The patient was advised to call back or seek an in-person evaluation if the symptoms worsen or if the condition fails to improve as anticipated.     Eliezer Lofts, MD

## 2018-10-04 ENCOUNTER — Other Ambulatory Visit: Payer: Self-pay

## 2018-10-04 MED ORDER — TRAZODONE HCL 100 MG PO TABS: 100.0000 mg | ORAL_TABLET | Freq: Every evening | ORAL | 0 refills | Status: DC | PRN

## 2018-10-04 MED ORDER — CLONAZEPAM 1 MG PO TABS: 0.5000 mg | ORAL_TABLET | Freq: Two times a day (BID) | ORAL | 0 refills | Status: DC | PRN

## 2018-10-04 NOTE — Telephone Encounter (Signed)
Last office visit 07/31/2018 MDD/GAD.  Last refilled 07/10/2018 for #90 with no refills.  CPE scheduled for 01/01/2019.

## 2018-10-23 ENCOUNTER — Ambulatory Visit (INDEPENDENT_AMBULATORY_CARE_PROVIDER_SITE_OTHER): Payer: Medicare Other

## 2018-10-23 DIAGNOSIS — Z23 Encounter for immunization: Secondary | ICD-10-CM

## 2018-11-15 ENCOUNTER — Other Ambulatory Visit: Payer: Self-pay | Admitting: *Deleted

## 2018-11-15 MED ORDER — SERTRALINE HCL 100 MG PO TABS: 100.0000 mg | ORAL_TABLET | Freq: Every day | ORAL | 1 refills | Status: DC

## 2018-12-12 ENCOUNTER — Telehealth: Payer: Self-pay

## 2018-12-12 NOTE — Telephone Encounter (Signed)
LVM to call clinic, needs COVID screen, front door and back lab info 11.18.2020 TLJ

## 2018-12-17 ENCOUNTER — Telehealth: Payer: Self-pay | Admitting: Family Medicine

## 2018-12-17 DIAGNOSIS — M810 Age-related osteoporosis without current pathological fracture: Secondary | ICD-10-CM

## 2018-12-17 DIAGNOSIS — Z130 Encounter for screening for diseases of the blood and blood-forming organs and certain disorders involving the immune mechanism: Secondary | ICD-10-CM

## 2018-12-17 DIAGNOSIS — Z79899 Other long term (current) drug therapy: Secondary | ICD-10-CM

## 2018-12-17 NOTE — Telephone Encounter (Signed)
-----   Message from Ellamae Sia sent at 12/10/2018  2:49 PM EST ----- Regarding: Lab orders for Wednesday, 11.25.20 Patient is scheduled for CPX labs, please order future labs, Thanks , Karna Christmas

## 2018-12-18 ENCOUNTER — Ambulatory Visit: Payer: Medicare Other

## 2018-12-18 ENCOUNTER — Ambulatory Visit (INDEPENDENT_AMBULATORY_CARE_PROVIDER_SITE_OTHER): Payer: Medicare Other

## 2018-12-18 VITALS — BP 139/74 | HR 47 | Wt 115.0 lb

## 2018-12-18 DIAGNOSIS — Z0000 Encounter for general adult medical examination without abnormal findings: Secondary | ICD-10-CM

## 2018-12-18 NOTE — Progress Notes (Signed)
PCP notes:  Health Maintenance: Decline shingrix   Abnormal Screenings: none   Patient concerns: none   Nurse concerns: none   Next PCP appt.: 12/27/2018 @ 11:20 am

## 2018-12-18 NOTE — Patient Instructions (Signed)
Catherine Young , Thank you for taking time to come for your Medicare Wellness Visit. I appreciate your ongoing commitment to your health goals. Please review the following plan we discussed and let me know if I can assist you in the future.   Screening recommendations/referrals: Colonoscopy: no longer required Mammogram: no longer required Bone Density: Up to date, completed 12/20/2016 Recommended yearly ophthalmology/optometry visit for glaucoma screening and checkup Recommended yearly dental visit for hygiene and checkup  Vaccinations: Influenza vaccine: Up to date, completed 10/23/2018 Pneumococcal vaccine: Completed series Tdap vaccine: decline Shingles vaccine: declined    Advanced directives: Please bring a copy of your POA (Power of Attorney) and/or Living Will to your next appointment.  Conditions/risks identified: none  Next appointment: 12/27/2018 @ 11:20 am    Preventive Care 65 Years and Older, Female Preventive care refers to lifestyle choices and visits with your health care provider that can promote health and wellness. What does preventive care include?  A yearly physical exam. This is also called an annual well check.  Dental exams once or twice a year.  Routine eye exams. Ask your health care provider how often you should have your eyes checked.  Personal lifestyle choices, including:  Daily care of your teeth and gums.  Regular physical activity.  Eating a healthy diet.  Avoiding tobacco and drug use.  Limiting alcohol use.  Practicing safe sex.  Taking low-dose aspirin every day.  Taking vitamin and mineral supplements as recommended by your health care provider. What happens during an annual well check? The services and screenings done by your health care provider during your annual well check will depend on your age, overall health, lifestyle risk factors, and family history of disease. Counseling  Your health care provider may ask you questions  about your:  Alcohol use.  Tobacco use.  Drug use.  Emotional well-being.  Home and relationship well-being.  Sexual activity.  Eating habits.  History of falls.  Memory and ability to understand (cognition).  Work and work Statistician.  Reproductive health. Screening  You may have the following tests or measurements:  Height, weight, and BMI.  Blood pressure.  Lipid and cholesterol levels. These may be checked every 5 years, or more frequently if you are over 8 years old.  Skin check.  Lung cancer screening. You may have this screening every year starting at age 22 if you have a 30-pack-year history of smoking and currently smoke or have quit within the past 15 years.  Fecal occult blood test (FOBT) of the stool. You may have this test every year starting at age 26.  Flexible sigmoidoscopy or colonoscopy. You may have a sigmoidoscopy every 5 years or a colonoscopy every 10 years starting at age 85.  Hepatitis C blood test.  Hepatitis B blood test.  Sexually transmitted disease (STD) testing.  Diabetes screening. This is done by checking your blood sugar (glucose) after you have not eaten for a while (fasting). You may have this done every 1-3 years.  Bone density scan. This is done to screen for osteoporosis. You may have this done starting at age 38.  Mammogram. This may be done every 1-2 years. Talk to your health care provider about how often you should have regular mammograms. Talk with your health care provider about your test results, treatment options, and if necessary, the need for more tests. Vaccines  Your health care provider may recommend certain vaccines, such as:  Influenza vaccine. This is recommended every year.  Tetanus,  diphtheria, and acellular pertussis (Tdap, Td) vaccine. You may need a Td booster every 10 years.  Zoster vaccine. You may need this after age 32.  Pneumococcal 13-valent conjugate (PCV13) vaccine. One dose is  recommended after age 83.  Pneumococcal polysaccharide (PPSV23) vaccine. One dose is recommended after age 62. Talk to your health care provider about which screenings and vaccines you need and how often you need them. This information is not intended to replace advice given to you by your health care provider. Make sure you discuss any questions you have with your health care provider. Document Released: 02/06/2015 Document Revised: 09/30/2015 Document Reviewed: 11/11/2014 Elsevier Interactive Patient Education  2017 [place] Prevention in the Home Falls can cause injuries. They can happen to people of all ages. There are many things you can do to make your home safe and to help prevent falls. What can I do on the outside of my home?  Regularly fix the edges of walkways and driveways and fix any cracks.  Remove anything that might make you trip as you walk through a door, such as a raised step or threshold.  Trim any bushes or trees on the path to your home.  Use bright outdoor lighting.  Clear any walking paths of anything that might make someone trip, such as rocks or tools.  Regularly check to see if handrails are loose or broken. Make sure that both sides of any steps have handrails.  Any raised decks and porches should have guardrails on the edges.  Have any leaves, snow, or ice cleared regularly.  Use sand or salt on walking paths during winter.  Clean up any spills in your garage right away. This includes oil or grease spills. What can I do in the bathroom?  Use night lights.  Install grab bars by the toilet and in the tub and shower. Do not use towel bars as grab bars.  Use non-skid mats or decals in the tub or shower.  If you need to sit down in the shower, use a plastic, non-slip stool.  Keep the floor dry. Clean up any water that spills on the floor as soon as it happens.  Remove soap buildup in the tub or shower regularly.  Attach bath mats  securely with double-sided non-slip rug tape.  Do not have throw rugs and other things on the floor that can make you trip. What can I do in the bedroom?  Use night lights.  Make sure that you have a light by your bed that is easy to reach.  Do not use any sheets or blankets that are too big for your bed. They should not hang down onto the floor.  Have a firm chair that has side arms. You can use this for support while you get dressed.  Do not have throw rugs and other things on the floor that can make you trip. What can I do in the kitchen?  Clean up any spills right away.  Avoid walking on wet floors.  Keep items that you use a lot in easy-to-reach places.  If you need to reach something above you, use a strong step stool that has a grab bar.  Keep electrical cords out of the way.  Do not use floor polish or wax that makes floors slippery. If you must use wax, use non-skid floor wax.  Do not have throw rugs and other things on the floor that can make you trip. What can I do with  my stairs?  Do not leave any items on the stairs.  Make sure that there are handrails on both sides of the stairs and use them. Fix handrails that are broken or loose. Make sure that handrails are as long as the stairways.  Check any carpeting to make sure that it is firmly attached to the stairs. Fix any carpet that is loose or worn.  Avoid having throw rugs at the top or bottom of the stairs. If you do have throw rugs, attach them to the floor with carpet tape.  Make sure that you have a light switch at the top of the stairs and the bottom of the stairs. If you do not have them, ask someone to add them for you. What else can I do to help prevent falls?  Wear shoes that:  Do not have high heels.  Have rubber bottoms.  Are comfortable and fit you well.  Are closed at the toe. Do not wear sandals.  If you use a stepladder:  Make sure that it is fully opened. Do not climb a closed  stepladder.  Make sure that both sides of the stepladder are locked into place.  Ask someone to hold it for you, if possible.  Clearly mark and make sure that you can see:  Any grab bars or handrails.  First and last steps.  Where the edge of each step is.  Use tools that help you move around (mobility aids) if they are needed. These include:  Canes.  Walkers.  Scooters.  Crutches.  Turn on the lights when you go into a dark area. Replace any light bulbs as soon as they burn out.  Set up your furniture so you have a clear path. Avoid moving your furniture around.  If any of your floors are uneven, fix them.  If there are any pets around you, be aware of where they are.  Review your medicines with your doctor. Some medicines can make you feel dizzy. This can increase your chance of falling. Ask your doctor what other things that you can do to help prevent falls. This information is not intended to replace advice given to you by your health care provider. Make sure you discuss any questions you have with your health care provider. Document Released: 11/06/2008 Document Revised: 06/18/2015 Document Reviewed: 02/14/2014 Elsevier Interactive Patient Education  2017 [employer].

## 2018-12-18 NOTE — Progress Notes (Signed)
Subjective:   Catherine Young is a 85 y.o. female who presents for Medicare Annual (Subsequent) preventive examination.  Review of Systems: N/A   This visit is being conducted through telemedicine via telephone at the nurse health advisor's home address due to the COVID-19 pandemic. This patient has given me verbal consent via doximity to conduct this visit, patient states they are participating from their home address. Patient and myself are on the telephone call. There is no referral for this visit. Some vital signs may be absent or patient reported.    Patient identification: identified by name, DOB, and current address  Cardiac Risk Factors include: advanced age (>15men, >68 women)     Objective:     Vitals: BP 139/74   Pulse (!) 47   Wt 115 lb (52.2 kg)   BMI 18.85 kg/m   Body mass index is 18.85 kg/m.  Advanced Directives 12/18/2018 12/13/2017 05/25/2017 11/22/2016  Does Patient Have a Medical Advance Directive? Yes Yes Yes Yes  Type of Paramedic of [place];Living will [place];Living will [place];Living will [place];Living will  Does patient want to make changes to medical advance directive? - - No - Patient declined -  Copy of [place] in Chart? No - copy requested No - copy requested No - copy requested No - copy requested    Tobacco Social History   Tobacco Use  Smoking Status Former Smoker  . Quit date: 01/24/1993  . Years since quitting: 25.9  Smokeless Tobacco Never Used  Tobacco Comment   quit 1995     Counseling given: Not Answered Comment: quit 1995   Clinical Intake:  Pre-visit preparation completed: Yes  Pain : 0-10 Pain Score: 5  Pain Type: Chronic pain Pain Location: (buttocks, legs, knees) Pain Orientation: Lower Pain Descriptors / Indicators: Aching Pain Onset: More than a month ago Pain Frequency: Intermittent     Nutritional  Risks: None Diabetes: No  How often do you need to have someone help you when you read instructions, pamphlets, or other written materials from your doctor or pharmacy?: 1 - Never What is the last grade level you completed in school?: 12th  Interpreter Needed?: No  Information entered by :: CJohnson, LPN  Past Medical History:  Diagnosis Date  . Arthritis   . Basal cell carcinoma of tragus, left   . Cataract    right  . Complication of anesthesia    COULD'T MOVE WHEN WAKING UP AFTER [place] COLON SURGERY  . Depression   . Hypertension   . Intestinal obstruction ([place])   . Osteoporosis    Past Surgical History:  Procedure Laterality Date  . ABDOMINAL HYSTERECTOMY     partial, for menorrhagia  . APPENDECTOMY    . CATARACT EXTRACTION W/PHACO Right 05/25/2017   Procedure: CATARACT EXTRACTION PHACO AND INTRAOCULAR LENS PLACEMENT (IOC);  Surgeon: Eulogio Bear, MD;  Location: [facility] ORS;  Service: Ophthalmology;  Laterality: Right;  fluid pack lot #  [id number]  exp  12/24/2018 [place]   00:39.4 AP%   9.4 CDE    3.71   . COLON SURGERY     INTESTINAL BLOCKAGE FROM SCAR TISSUE X 2   Family History  Problem Relation Age of Onset  . Arthritis Mother   . Colon cancer Mother   . Cancer Mother        colon  . Diabetes Father   . Arthritis Sister   . Colon  cancer Sister   . Cancer Sister        colon  . Lung cancer Brother   . Cancer Brother        lung  . Arthritis Sister   . Colon cancer Sister   . Cancer Sister        colon   Social History   Socioeconomic History  . Marital status: Widowed    Spouse name: Not on file  . Number of children: Not on file  . Years of education: Not on file  . Highest education level: Not on file  Occupational History  . Not on file  Social Needs  . Financial resource strain: Not hard at all  . Food insecurity    Worry: Never true    Inability: Never true  . Transportation needs    Medical: No    Non-medical: No  Tobacco Use  .  Smoking status: Former Smoker    Quit date: 01/24/1993    Years since quitting: 25.9  . Smokeless tobacco: Never Used  . Tobacco comment: quit 1995  Substance and Sexual Activity  . Alcohol use: Yes    Alcohol/week: 7.0 standard drinks    Types: 7 Glasses of wine per week    Comment: nightly glass of wine  . Drug use: No  . Sexual activity: Never  Lifestyle  . Physical activity    Days per week: 0 days    Minutes per session: 0 min  . Stress: Rather much  Relationships  . Social Herbalist on phone: Not on file    Gets together: Not on file    Attends religious service: Not on file    Active member of club or organization: Not on file    Attends meetings of clubs or organizations: Not on file    Relationship status: Not on file  Other Topics Concern  . Not on file  Social History Narrative  . Not on file    Outpatient Encounter Medications as of 12/18/2018  Medication Sig  . Calcium Carb-Cholecalciferol (CALCIUM 600/VITAMIN D3 PO) Take 1 tablet by mouth daily.  . Cholecalciferol (VITAMIN D3) 50 MCG (2000 UT) capsule Take 2,000 Units by mouth daily.  . clonazePAM (KLONOPIN) 1 MG tablet Take 0.5 tablets (0.5 mg total) by mouth 2 (two) times daily as needed for anxiety.  . sertraline (ZOLOFT) 100 MG tablet Take 1 tablet (100 mg total) by mouth at bedtime.  . traZODone (DESYREL) 100 MG tablet Take 1 tablet (100 mg total) by mouth at bedtime as needed for sleep.   No facility-administered encounter medications on file as of 12/18/2018.     Activities of Daily Living In your present state of health, do you have any difficulty performing the following activities: 12/18/2018  Hearing? N  Vision? N  Difficulty concentrating or making decisions? Y  Comment forgets where she puts things  Walking or climbing stairs? Y  Comment knee pain  Dressing or bathing? N  Doing errands, shopping? N  Preparing Food and eating ? N  Using the Toilet? N  In the past six months,  have you accidently leaked urine? Y  Comment wears pad  Do you have problems with loss of bowel control? Y  Comment wears pad  Managing your Medications? N  Managing your Finances? N  Housekeeping or managing your Housekeeping? N  Some recent data might be hidden    Patient Care Team: Jinny Sanders, MD as PCP - General (  Family Medicine) Eulogio Bear, MD as Consulting Physician (Ophthalmology)    Assessment:   This is a routine wellness examination for [facility].  Exercise Activities and Dietary recommendations Current Exercise Habits: Home exercise routine, Type of exercise: walking, Time (Minutes): 30, Frequency (Times/Week): 2, Weekly Exercise (Minutes/Week): 60, Exercise limited by: None identified  Goals    . Health Management     Starting 12/13/2017, I will continue to do water aerobics for 60 min 1 day per week and to drink at least 32 oz of water daily.     . Patient Stated     12/18/2018, I will maintain and continue medications as prescribed.        Fall Risk Fall Risk  12/18/2018 12/13/2017 11/22/2016 07/14/2015 01/13/2015  Falls in the past year? 0 0 No No No  Number falls in past yr: 0 - - - -  Injury with Fall? 0 - - - -  Risk for fall due to : Impaired balance/gait;Impaired mobility;Medication side effect - - - -  Follow up Falls evaluation completed;Falls prevention discussed - - - -   Is the patient's home free of loose throw rugs in walkways, pet beds, electrical cords, etc?   yes      Grab bars in the bathroom? yes      Handrails on the stairs?   yes      Adequate lighting?   yes  Timed Get Up and Go performed: N/A  Depression Screen PHQ 2/9 Scores 12/18/2018 07/31/2018 01/16/2018 12/13/2017  PHQ - 2 Score 6 4 4 4   PHQ- 9 Score 9 9 9 12      Cognitive Function MMSE - Mini Mental State Exam 12/18/2018 12/13/2017 11/22/2016  Orientation to time 5 5 5   Orientation to Place 5 5 5   Registration 3 3 3   Attention/ Calculation 5 0 0  Recall 3 3 3    Language- name 2 objects - 0 0  Language- repeat 1 1 1   Language- follow 3 step command - 3 2  Language- follow 3 step command-comments - - unable to follow 1 step of 3 step command  Language- read & follow direction - 0 0  Write a sentence - 0 0  Copy design - 0 0  Total score - 20 19  Mini Cog  Mini-Cog screen was completed. Maximum score is 22. A value of 0 denotes this part of the MMSE was not completed or the patient failed this part of the Mini-Cog screening.      Immunization History  Administered Date(s) Administered  . Fluad Quad(high Dose 65+) 10/23/2018  . Influenza,inj,quad, With Preservative 11/10/2016  . Influenza-Unspecified 11/17/2016, 10/24/2017  . Pneumococcal Conjugate-13 10/25/2011, 11/22/2016  . Pneumococcal Polysaccharide-23 12/13/2017    Qualifies for Shingles Vaccine? yes  Screening Tests Health Maintenance  Topic Date Due  . TETANUS/TDAP  11/23/2026 (Originally 10/17/1950)  . INFLUENZA VACCINE  Completed  . DEXA SCAN  Completed  . PNA vac Low Risk Adult  Completed    Cancer Screenings: Lung: Low Dose CT Chest recommended if Age 68-80 years, 30 pack-year currently smoking OR have quit w/in 15years. Patient does not qualify. Breast:  Up to date on Mammogram? Yes, no longer required   Up to date of Bone Density/Dexa? Yes Colorectal: no longer required  Additional Screenings:  Hepatitis C Screening: N/A      Plan:    Patient will maintain and continue medications as prescribed.    I have personally reviewed and noted the following  in the patient's chart:   . Medical and social history . Use of alcohol, tobacco or illicit drugs  . Current medications and supplements . Functional ability and status . Nutritional status . Physical activity . Advanced directives . List of other physicians . Hospitalizations, surgeries, and ER visits in previous 12 months . Vitals . Screenings to include cognitive, depression, and falls . Referrals and  appointments  In addition, I have reviewed and discussed with patient certain preventive protocols, quality metrics, and best practice recommendations. A written personalized care plan for preventive services as well as general preventive health recommendations were provided to patient.     Andrez Grime, LPN  [phone]

## 2018-12-19 ENCOUNTER — Other Ambulatory Visit (INDEPENDENT_AMBULATORY_CARE_PROVIDER_SITE_OTHER): Payer: Medicare Other

## 2018-12-19 DIAGNOSIS — Z79899 Other long term (current) drug therapy: Secondary | ICD-10-CM | POA: Diagnosis not present

## 2018-12-19 DIAGNOSIS — Z130 Encounter for screening for diseases of the blood and blood-forming organs and certain disorders involving the immune mechanism: Secondary | ICD-10-CM

## 2018-12-19 DIAGNOSIS — M810 Age-related osteoporosis without current pathological fracture: Secondary | ICD-10-CM | POA: Diagnosis not present

## 2018-12-19 LAB — COMPREHENSIVE METABOLIC PANEL
ALT: 8 U/L (ref 0–35)
AST: 18 U/L (ref 0–37)
Albumin: 4.1 g/dL (ref 3.5–5.2)
Alkaline Phosphatase: 43 U/L (ref 39–117)
BUN: 15 mg/dL (ref 6–23)
CO2: 32 mEq/L (ref 19–32)
Calcium: 9.8 mg/dL (ref 8.4–10.5)
Chloride: 99 mEq/L (ref 96–112)
Creatinine, Ser: 0.81 mg/dL (ref 0.40–1.20)
GFR: 67.17 mL/min (ref >60.00)
Glucose, Bld: 102 mg/dL — ABNORMAL HIGH (ref 70–99)
Potassium: 4.2 mEq/L (ref 3.5–5.1)
Sodium: 138 mEq/L (ref 135–145)
Total Bilirubin: 0.6 mg/dL (ref 0.2–1.2)
Total Protein: 7 g/dL (ref 6.0–8.3)

## 2018-12-19 LAB — CBC WITH DIFFERENTIAL/PLATELET
Basophils Absolute: 0 10*3/uL (ref 0.0–0.1)
Basophils Relative: 0.5 % (ref 0.0–3.0)
Eosinophils Absolute: 0.1 10*3/uL (ref 0.0–0.7)
Eosinophils Relative: 2.9 % (ref 0.0–5.0)
HCT: 40.4 % (ref 36.0–46.0)
Hemoglobin: 13.5 g/dL (ref 12.0–15.0)
Lymphocytes Relative: 37.2 % (ref 12.0–46.0)
Lymphs Abs: 1.7 10*3/uL (ref 0.7–4.0)
MCHC: 33.3 g/dL (ref 30.0–36.0)
MCV: 97.7 fl (ref 78.0–100.0)
Monocytes Absolute: 0.5 10*3/uL (ref 0.1–1.0)
Monocytes Relative: 10.9 % (ref 3.0–12.0)
Neutro Abs: 2.2 10*3/uL (ref 1.4–7.7)
Neutrophils Relative %: 48.5 % (ref 43.0–77.0)
Platelets: 239 10*3/uL (ref 150.0–400.0)
RBC: 4.14 Mil/uL (ref 3.87–5.11)
RDW: 12.9 % (ref 11.5–15.5)
WBC: 4.6 10*3/uL (ref 4.0–10.5)

## 2018-12-19 LAB — VITAMIN D 25 HYDROXY (VIT D DEFICIENCY, FRACTURES): VITD: 59.75 ng/mL (ref 30.00–100.00)

## 2018-12-25 NOTE — Progress Notes (Signed)
No critical labs need to be addressed urgently. We will discuss labs in detail at upcoming office visit.   

## 2018-12-27 ENCOUNTER — Encounter: Payer: Self-pay | Admitting: Family Medicine

## 2018-12-27 ENCOUNTER — Other Ambulatory Visit: Payer: Self-pay

## 2018-12-27 ENCOUNTER — Ambulatory Visit (INDEPENDENT_AMBULATORY_CARE_PROVIDER_SITE_OTHER): Payer: Medicare Other | Admitting: Family Medicine

## 2018-12-27 VITALS — BP 150/70 | HR 48 | Temp 98.0°F | Ht 65.5 in | Wt 112.5 lb

## 2018-12-27 DIAGNOSIS — R5383 Other fatigue: Secondary | ICD-10-CM | POA: Diagnosis not present

## 2018-12-27 DIAGNOSIS — F5104 Psychophysiologic insomnia: Secondary | ICD-10-CM

## 2018-12-27 DIAGNOSIS — F331 Major depressive disorder, recurrent, moderate: Secondary | ICD-10-CM

## 2018-12-27 DIAGNOSIS — M48061 Spinal stenosis, lumbar region without neurogenic claudication: Secondary | ICD-10-CM

## 2018-12-27 DIAGNOSIS — M4800 Spinal stenosis, site unspecified: Secondary | ICD-10-CM | POA: Insufficient documentation

## 2018-12-27 DIAGNOSIS — Z0000 Encounter for general adult medical examination without abnormal findings: Secondary | ICD-10-CM

## 2018-12-27 DIAGNOSIS — F411 Generalized anxiety disorder: Secondary | ICD-10-CM

## 2018-12-27 LAB — VITAMIN B12: Vitamin B-12: 236 pg/mL (ref 211–911)

## 2018-12-27 LAB — TSH: TSH: 1.83 u[IU]/mL (ref 0.35–4.50)

## 2018-12-27 MED ORDER — BUPROPION HCL ER (XL) 150 MG PO TB24: 150.0000 mg | ORAL_TABLET | Freq: Every day | ORAL | 11 refills | Status: DC

## 2018-12-27 NOTE — Assessment & Plan Note (Signed)
Moderate control on trazodone.. does not cause AM grogginess.

## 2018-12-27 NOTE — Assessment & Plan Note (Signed)
Inadequate control.Marland Kitchen add bu[propion for energy and boost of SSRI.

## 2018-12-27 NOTE — Assessment & Plan Note (Signed)
She does have some foot and leg pain bilaterally as well as low back pain.  If not improving.. we can consdier gabapentin trial ( hesitate given fatgiue SE)

## 2018-12-27 NOTE — Assessment & Plan Note (Signed)
MAy be secondary to mood.  eval with labs.

## 2018-12-27 NOTE — Patient Instructions (Addendum)
Please stop at the lab to have labs drawn.  Continue sertraline at night.  Start wellbutrin XL in morning.

## 2018-12-27 NOTE — Progress Notes (Signed)
Chief Complaint  Patient presents with  . Annual Exam    Part 2    History of Present Illness: HPI  The patient presents for complete physical and review of chronic health problems. He/She also has the following acute concerns today: She is feeling fatigued, in last  9 months, By end of day she is exhausted. She feels depression worse with COVID pandemic. She has noted the fatigue since the isolation with the pandemic. She feels her memory has been worse since the isolation. See son 2 times a month. She feels therapist does not help.  nml cbc, nml Vit D, nml CMET   MMSE at [facility] was stable.  The patient saw a LPN or RN for medicare wellness visit.  Prevention and wellness was reviewed in detail. Note reviewed and important notes copied below. Health Maintenance: Decline shingrix Abnormal Screenings: none  12/27/18  MDD,GAD, chronic insomnia: Moderate control on sertraline 100 mg daily Trying to limit klonopin use to 1/2 tab BID. Using trazodone at night for sleep... this is helping a lot 6 hours night.  Spinal stenosis: Followed by Dr. Sharlet Salina s/p many ESI. No longer helpful.  She does have some foot and leg pain bilaterally as well as low back pain.  If not improving.. we can consdier gabapentin trial ( hesitate given fatgiue SE)  She is not exercising because COVID. Walking some but limited. Seeing ORTHO for right knee  This visit occurred during the SARS-CoV-2 public health emergency.  Safety protocols were in place, including screening questions prior to the visit, additional usage of staff PPE, and extensive cleaning of exam room while observing appropriate contact time as indicated for disinfecting solutions.   COVID 19 screen:  No recent travel or known exposure to COVID19 The patient denies respiratory symptoms of COVID 19 at this time. The importance of social distancing was discussed today.     Review of Systems  Constitutional: Positive for  malaise/fatigue. Negative for chills and fever.  HENT: Negative for congestion and ear pain.   Eyes: Negative for pain and redness.  Respiratory: Negative for cough and shortness of breath.   Cardiovascular: Negative for chest pain, palpitations and leg swelling.  Gastrointestinal: Negative for abdominal pain, blood in stool, constipation, diarrhea, nausea and vomiting.  Genitourinary: Negative for dysuria.  Musculoskeletal: Negative for falls and myalgias.  Skin: Negative for rash.  Neurological: Negative for dizziness.  Psychiatric/Behavioral: Positive for depression and memory loss. Negative for hallucinations, substance abuse and suicidal ideas. The patient is nervous/anxious.       Past Medical History:  Diagnosis Date  . Arthritis   . Basal cell carcinoma of tragus, left   . Cataract    right  . Complication of anesthesia    COULD'T MOVE WHEN WAKING UP AFTER [place] COLON SURGERY  . Depression   . Hypertension   . Intestinal obstruction ([place])   . Osteoporosis     reports that she quit smoking about 25 years ago. She has never used smokeless tobacco. She reports current alcohol use of about 7.0 standard drinks of alcohol per week. She reports that she does not use drugs.   Current Outpatient Medications:  .  Calcium Carb-Cholecalciferol (CALCIUM 600/VITAMIN D3 PO), Take 1 tablet by mouth daily., Disp: , Rfl:  .  Cholecalciferol (VITAMIN D3) 50 MCG (2000 UT) capsule, Take 2,000 Units by mouth daily., Disp: , Rfl:  .  clonazePAM (KLONOPIN) 1 MG tablet, Take 0.5 tablets (0.5 mg total) by mouth 2 (two) times  daily as needed for anxiety., Disp: 90 tablet, Rfl: 0 .  sertraline (ZOLOFT) 100 MG tablet, Take 1 tablet (100 mg total) by mouth at bedtime., Disp: 90 tablet, Rfl: 1 .  traZODone (DESYREL) 100 MG tablet, Take 1 tablet (100 mg total) by mouth at bedtime as needed for sleep., Disp: 90 tablet, Rfl: 0   Observations/Objective: Blood pressure (!) 150/70, pulse (!) 48, temperature  98 F (36.7 C), temperature source Temporal, height 5' 5.5" (1.664 m), weight 112 lb 8 oz (51 kg), SpO2 97 %.  Physical Exam Constitutional:      General: She is not in acute distress.    Appearance: Normal appearance. She is well-developed. She is not ill-appearing or toxic-appearing.     Comments: Elderly in NAD  HENT:     Head: Normocephalic.     Right Ear: Hearing, tympanic membrane, ear canal and external ear normal.     Left Ear: Hearing, tympanic membrane, ear canal and external ear normal.     Nose: Nose normal.  Eyes:     General: Lids are normal. Lids are everted, no foreign bodies appreciated.     Conjunctiva/sclera: Conjunctivae normal.     Pupils: Pupils are equal, round, and reactive to light.  Neck:     Musculoskeletal: Normal range of motion and neck supple.     Thyroid: No thyroid mass or thyromegaly.     Vascular: No carotid bruit.     Trachea: Trachea normal.  Cardiovascular:     Rate and Rhythm: Normal rate and regular rhythm.     Heart sounds: Normal heart sounds, S1 normal and S2 normal. No murmur. No gallop.   Pulmonary:     Effort: Pulmonary effort is normal. No respiratory distress.     Breath sounds: Normal breath sounds. No wheezing, rhonchi or rales.  Abdominal:     General: Bowel sounds are normal. There is no distension or abdominal bruit.     Palpations: Abdomen is soft. There is no fluid wave or mass.     Tenderness: There is no abdominal tenderness. There is no guarding or rebound.     Hernia: No hernia is present.  Feet:     Comments: Bilateral bunions Lymphadenopathy:     Cervical: No cervical adenopathy.  Skin:    General: Skin is warm and dry.     Findings: No rash.  Neurological:     Mental Status: She is alert.     Cranial Nerves: No cranial nerve deficit.     Sensory: No sensory deficit.  Psychiatric:        Mood and Affect: Mood is not anxious or depressed.        Speech: Speech normal.        Behavior: Behavior normal. Behavior  is cooperative.        Judgment: Judgment normal.      Assessment and Plan The patient's preventative maintenance and recommended screening tests for an annual wellness exam were reviewed in full today. Brought up to date unless services declined.  Counselled on the importance of diet, exercise, and its role in overall health and mortality. The patient's FH and SH was reviewed, including their home life, tobacco status, and drug and alcohol status.   Vaccines:reviewed, Uptodate except refused td and shingles vaccines Pap/DVE:Not indicated given age Mammo:not indicated given age Bone Density:2018 DEXA.. Osteoporosis in hip started on alendronate weekly  12/2016 but had severe SE. Taking ca and Vit D She does not want new med  at this time. DUE for re-eval this year 2020.. she wishes to hold off at this time. Colon:Not indicated Smoking Status:none ETOH/ drug [id number]   Spinal stenosis She does have some foot and leg pain bilaterally as well as low back pain.  If not improving.. we can consdier gabapentin trial ( hesitate given fatgiue SE)   Moderate recurrent major depression (HCC) Inadequate control.Marland Kitchen add bu[propion for energy and boost of SSRI.  Chronic insomnia Moderate control on trazodone.. does not cause AM grogginess.  Fatigue MAy be secondary to mood.  eval with labs.     Eliezer Lofts, MD

## 2018-12-31 ENCOUNTER — Other Ambulatory Visit: Payer: Self-pay | Admitting: *Deleted

## 2018-12-31 NOTE — Telephone Encounter (Signed)
Last office visit 12/27/2018 for CPE.  Last refilled 10/04/2018 for #90 with no refills.  Next Appt: 01/24/2019 for follow up MDD & GAD.

## 2019-01-01 ENCOUNTER — Encounter: Payer: Medicare Other | Admitting: Family Medicine

## 2019-01-01 MED ORDER — CLONAZEPAM 1 MG PO TABS: 0.5000 mg | ORAL_TABLET | Freq: Two times a day (BID) | ORAL | 0 refills | Status: DC | PRN

## 2019-01-24 ENCOUNTER — Other Ambulatory Visit: Payer: Self-pay

## 2019-01-24 ENCOUNTER — Ambulatory Visit (INDEPENDENT_AMBULATORY_CARE_PROVIDER_SITE_OTHER): Payer: Medicare Other | Admitting: Family Medicine

## 2019-01-24 ENCOUNTER — Encounter: Payer: Self-pay | Admitting: Family Medicine

## 2019-01-24 VITALS — BP 130/70 | HR 52 | Temp 97.9°F | Ht 65.5 in | Wt 112.8 lb

## 2019-01-24 DIAGNOSIS — F411 Generalized anxiety disorder: Secondary | ICD-10-CM | POA: Diagnosis not present

## 2019-01-24 DIAGNOSIS — F331 Major depressive disorder, recurrent, moderate: Secondary | ICD-10-CM

## 2019-01-24 DIAGNOSIS — F5104 Psychophysiologic insomnia: Secondary | ICD-10-CM

## 2019-01-24 DIAGNOSIS — R5383 Other fatigue: Secondary | ICD-10-CM | POA: Diagnosis not present

## 2019-01-24 NOTE — Assessment & Plan Note (Signed)
Moderate control. Limiting clonazepam use.

## 2019-01-24 NOTE — Assessment & Plan Note (Signed)
Moderate control on bupropion and sertraline.  Increase activity, end isolation when able to, increase outdoor activity when able to.

## 2019-01-24 NOTE — Patient Instructions (Addendum)
Try to increase physical activity.  Once able to try to get some sunshine and outdoor activity. Add an Boost or Ensure supplement to each meal to increase weight and nutrition.

## 2019-01-24 NOTE — Assessment & Plan Note (Signed)
Well controlled with trazodone and low dose clonazepam.

## 2019-01-24 NOTE — Progress Notes (Signed)
Chief Complaint  Patient presents with  . Follow-up    MDD/GAD    History of Present Illness: HPI   MDD/GAD: At last OV she was started on bupropion 150 mg daily. This was added to  sertraline  Chronic insomnia/fatigue:  Trazodone helping a lot with with sleep. Neg labs eval m, TSH and B12.  PHQ9: 11  GAD7:10   She reports she has not had much improvement in her mood.  She is sleeping well at night. Still tired later in the day about noon.  No Se to the bupropion.  isolation with pandemic bothers her a lot.  Using clonazepam 1/2 tab at night and occ during the day.     This visit occurred during the SARS-CoV-2 public health emergency.  Safety protocols were in place, including screening questions prior to the visit, additional usage of staff PPE, and extensive cleaning of exam room while observing appropriate contact time as indicated for disinfecting solutions.   COVID 19 screen:  No recent travel or known exposure to COVID19 The patient denies respiratory symptoms of COVID 19 at this time. The importance of social distancing was discussed today.     Review of Systems  Constitutional: Positive for malaise/fatigue. Negative for chills and fever.  HENT: Negative for congestion and ear pain.   Eyes: Negative for pain and redness.  Respiratory: Negative for cough and shortness of breath.   Cardiovascular: Negative for chest pain, palpitations and leg swelling.  Gastrointestinal: Negative for abdominal pain, blood in stool, constipation, diarrhea, nausea and vomiting.  Genitourinary: Negative for dysuria.  Musculoskeletal: Positive for joint pain. Negative for falls and myalgias.  Skin: Negative for rash.  Neurological: Negative for dizziness.  Psychiatric/Behavioral: Negative for depression. The patient is not nervous/anxious.       Past Medical History:  Diagnosis Date  . Arthritis   . Basal cell carcinoma of tragus, left   . Cataract    right  . Complication of  anesthesia    COULD'T MOVE WHEN WAKING UP AFTER [place] COLON SURGERY  . Depression   . Hypertension   . Intestinal obstruction ([place])   . Osteoporosis     reports that she quit smoking about 26 years ago. She has never used smokeless tobacco. She reports current alcohol use of about 7.0 standard drinks of alcohol per week. She reports that she does not use drugs.   Current Outpatient Medications:  .  buPROPion (WELLBUTRIN XL) 150 MG 24 hr tablet, Take 1 tablet (150 mg total) by mouth daily., Disp: 30 tablet, Rfl: 11 .  Calcium Carb-Cholecalciferol (CALCIUM 600/VITAMIN D3 PO), Take 1 tablet by mouth daily., Disp: , Rfl:  .  Cholecalciferol (VITAMIN D3) 50 MCG (2000 UT) capsule, Take 2,000 Units by mouth daily., Disp: , Rfl:  .  clonazePAM (KLONOPIN) 1 MG tablet, Take 0.5 tablets (0.5 mg total) by mouth 2 (two) times daily as needed for anxiety., Disp: 90 tablet, Rfl: 0 .  sertraline (ZOLOFT) 100 MG tablet, Take 1 tablet (100 mg total) by mouth at bedtime., Disp: 90 tablet, Rfl: 1 .  traZODone (DESYREL) 100 MG tablet, Take 1 tablet (100 mg total) by mouth at bedtime as needed for sleep., Disp: 90 tablet, Rfl: 0   Observations/Objective: Blood pressure 130/70, pulse (!) 52, temperature 97.9 F (36.6 C), temperature source Temporal, height 5' 5.5" (1.664 m), weight 112 lb 12 oz (51.1 kg).  Physical Exam Constitutional:      General: She is not in acute distress.  Appearance: Normal appearance. She is well-developed. She is not ill-appearing or toxic-appearing.     Comments: Elderly thin appearing female  HENT:     Head: Normocephalic.     Right Ear: Hearing, tympanic membrane, ear canal and external ear normal. Tympanic membrane is not erythematous, retracted or bulging.     Left Ear: Hearing, tympanic membrane, ear canal and external ear normal. Tympanic membrane is not erythematous, retracted or bulging.     Nose: No mucosal edema or rhinorrhea.     Right Sinus: No maxillary sinus  tenderness or frontal sinus tenderness.     Left Sinus: No maxillary sinus tenderness or frontal sinus tenderness.     Mouth/Throat:     Pharynx: Uvula midline.  Eyes:     General: Lids are normal. Lids are everted, no foreign bodies appreciated.     Conjunctiva/sclera: Conjunctivae normal.     Pupils: Pupils are equal, round, and reactive to light.  Neck:     Thyroid: No thyroid mass or thyromegaly.     Vascular: No carotid bruit.     Trachea: Trachea normal.  Cardiovascular:     Rate and Rhythm: Normal rate and regular rhythm.     Pulses: Normal pulses.     Heart sounds: Normal heart sounds, S1 normal and S2 normal. No murmur. No friction rub. No gallop.   Pulmonary:     Effort: Pulmonary effort is normal. No tachypnea or respiratory distress.     Breath sounds: Normal breath sounds. No decreased breath sounds, wheezing, rhonchi or rales.  Abdominal:     General: Bowel sounds are normal.     Palpations: Abdomen is soft.     Tenderness: There is no abdominal tenderness.  Musculoskeletal:     Cervical back: Normal range of motion and neck supple.  Skin:    General: Skin is warm and dry.     Findings: No rash.  Neurological:     Mental Status: She is alert.  Psychiatric:        Mood and Affect: Mood is not anxious or depressed.        Speech: Speech normal.        Behavior: Behavior normal. Behavior is cooperative.        Thought Content: Thought content normal.        Judgment: Judgment normal.     Wt Readings from Last 3 Encounters:  01/24/19 112 lb 12 oz (51.1 kg)  12/27/18 112 lb 8 oz (51 kg)  12/18/18 115 lb (52.2 kg)     Assessment and Plan GAD (generalized anxiety disorder) Moderate control. Limiting clonazepam use.  Chronic insomnia Well controlled with trazodone and low dose clonazepam.  Moderate recurrent major depression (HCC) Moderate control on bupropion and sertraline.  Increase activity, end isolation when able to, increase outdoor activity when  able to.   Rx for 4 prong cane given  Because of right knee pain from OA, decreased balance. Seeing Sports Med for injections.     Eliezer Lofts, MD

## 2019-02-14 ENCOUNTER — Encounter: Payer: Self-pay | Admitting: Family Medicine

## 2019-02-14 ENCOUNTER — Telehealth: Payer: Self-pay

## 2019-02-14 ENCOUNTER — Ambulatory Visit (INDEPENDENT_AMBULATORY_CARE_PROVIDER_SITE_OTHER): Payer: Medicare PPO | Admitting: Family Medicine

## 2019-02-14 VITALS — Ht 65.5 in | Wt 111.0 lb

## 2019-02-14 DIAGNOSIS — U071 COVID-19: Secondary | ICD-10-CM | POA: Diagnosis not present

## 2019-02-14 NOTE — Telephone Encounter (Signed)
Error

## 2019-02-14 NOTE — Assessment & Plan Note (Signed)
No red flags/need for ER visit or in-person exam at respiratory clinic at this time.  She is quarantined at [facility] and has RN checking on her daily.  We will set her up on respiratory call back list for further monitoring.    Pt moderate risk for COVID complications given  age. If SOB begins or  symptoms worsening.. have low threshold for in-person exam, if severe shortness of breath ER visit recommended.  Can monitor Oxygen saturation at home with home monitor if able to obtain.  Go to ER if O2 sat < 90% on room air.  Reviewed home care and provided information. Tylenol, robtussin and symptoms care.  Push fluids, rest and keep up with regular eating.   Provided info about prevention of spread of COVID 19.  Alos her son plans on checking on her closely per pt report.

## 2019-02-14 NOTE — Progress Notes (Signed)
VIRTUAL VISIT Due to national recommendations of social distancing due to [place] 19, a virtual visit is felt to be most appropriate for this patient at this time.   I connected with the patient on 02/14/19 at  3:40 PM EST by virtual telehealth platform and verified that I am speaking with the correct person using two identifiers.   I discussed the limitations, risks, security and privacy concerns of performing an evaluation and management service by  virtual telehealth platform and the availability of in person appointments. I also discussed with the patient that there may be a patient responsible charge related to this service. The patient expressed understanding and agreed to proceed.  Patient location: Home Provider Location: [facility] Participants: Eliezer Lofts and Lorette Ang   Chief Complaint  Patient presents with  . Headache    Had Covid vaccine Friday-Tested postiive for Covid today  . Cough  . Fatigue    History of Present Illness:  86 year old female presents following a posiitve COVID19 test.  She reports she  Has been having dizziness and , headache and dry cough in last 6 days.   She received the vaccine last as well.... she had significant SE to the vaccine... felt confused. This resolved but still had dizziness.  Had COVID test done  1/20 results returned  Today 1/21 at [facility].  RN evaluated her there yesterday.. clear lungs.  [place] is providing food for her. 4 She denies SOB..  No fever, no body aches.   She is using robitussin for cough.   She is drinking lots of water, no N/V/D.  No recent travel or known exposure to [place] The importance of social distancing was discussed today.   Review of Systems  Constitutional: Positive for malaise/fatigue. Negative for chills and fever.  HENT: Negative for congestion.   Respiratory: Positive for cough. Negative for sputum production and shortness of breath.   Cardiovascular: Negative  for chest pain and leg swelling.  Gastrointestinal: Negative for abdominal pain, constipation and diarrhea.  Neurological: Positive for dizziness and weakness.      Past Medical History:  Diagnosis Date  . Arthritis   . Basal cell carcinoma of tragus, left   . Cataract    right  . Complication of anesthesia    COULD'T MOVE WHEN WAKING UP AFTER [place] COLON SURGERY  . Depression   . Hypertension   . Intestinal obstruction ([place])   . Osteoporosis     reports that she quit smoking about 26 years ago. She has never used smokeless tobacco. She reports current alcohol use of about 7.0 standard drinks of alcohol per week. She reports that she does not use drugs.   Current Outpatient Medications:  .  buPROPion (WELLBUTRIN XL) 150 MG 24 hr tablet, Take 1 tablet (150 mg total) by mouth daily., Disp: 30 tablet, Rfl: 11 .  Calcium Carb-Cholecalciferol (CALCIUM 600/VITAMIN D3 PO), Take 1 tablet by mouth daily., Disp: , Rfl:  .  Cholecalciferol (VITAMIN D3) 50 MCG (2000 UT) capsule, Take 2,000 Units by mouth daily., Disp: , Rfl:  .  clonazePAM (KLONOPIN) 1 MG tablet, Take 0.5 tablets (0.5 mg total) by mouth 2 (two) times daily as needed for anxiety., Disp: 90 tablet, Rfl: 0 .  sertraline (ZOLOFT) 100 MG tablet, Take 1 tablet (100 mg total) by mouth at bedtime., Disp: 90 tablet, Rfl: 1 .  traZODone (DESYREL) 100 MG tablet, Take 1 tablet (100 mg total) by mouth at bedtime as needed  for sleep., Disp: 90 tablet, Rfl: 0   Observations/Objective: Height 5' 5.5" (1.664 m), weight 111 lb (50.3 kg).  Physical Exam  Physical Exam Constitutional:      General: The patient is not in acute distress. Speaking in complete sentences. Pulmonary:     Effort: Pulmonary effort is normal. No respiratory distress.  Neurological:     Mental Status: The patient is alert and oriented to person, place, and time.  Psychiatric:        Mood and Affect: Mood normal.        Behavior: Behavior normal. Alert and oriented x  3  Assessment and Plan    I discussed the assessment and treatment plan with the patient. The patient was provided an opportunity to ask questions and all were answered. The patient agreed with the plan and demonstrated an understanding of the instructions.   The patient was advised to call back or seek an in-person evaluation if the symptoms worsen or if the condition fails to improve as anticipated.   COVID-19 virus infection   No red flags/need for ER visit or in-person exam at respiratory clinic at this time.  She is quarantined at [facility] and has RN checking on her daily.  We will set her up on respiratory call back list for further monitoring.    Pt moderate risk for COVID complications given  age. If SOB begins or  symptoms worsening.. have low threshold for in-person exam, if severe shortness of breath ER visit recommended.  Can monitor Oxygen saturation at home with home monitor if able to obtain.  Go to ER if O2 sat < 90% on room air.  Reviewed home care and provided information. Tylenol, robtussin and symptoms care.  Push fluids, rest and keep up with regular eating.   Provided info about prevention of spread of COVID 19.  Alos her son plans on checking on her closely per pt report.     Eliezer Lofts, MD

## 2019-02-15 NOTE — Progress Notes (Signed)
Ms. Mccommons added to respiratory illness log as instructed by Dr. Diona Browner.

## 2019-02-18 ENCOUNTER — Telehealth: Payer: Self-pay | Admitting: *Deleted

## 2019-02-18 NOTE — Telephone Encounter (Signed)
Called to check on [name] who is on my Respiratory Illness Log.  Catherine Young tested positive for Covid last week.  She states she is feeling about the same.  Temperatures seem to be running around 99 degrees.  She states cough is better than it was.  She is coughing up light yellow phlegm.  She denies any SOB or loss of taste/smell.  She is aware that if she starts experiencing severe SOB to call 911.  Will follow up again with Catherine Young later this week.  FYI to Dr. Diona Browner.

## 2019-02-18 NOTE — Telephone Encounter (Signed)
Great. Noted. 

## 2019-02-22 ENCOUNTER — Telehealth: Payer: Self-pay | Admitting: *Deleted

## 2019-02-22 NOTE — Telephone Encounter (Signed)
Noted. Thank you fo follow up on her.

## 2019-02-22 NOTE — Telephone Encounter (Signed)
Spoke with [name].  She states she is coughing a lot which makes her back hurt.  It is a productive cough with yellowish phlegm .  She also complains of headaches.  Temperatures are running 98-99 degress.  Denies any SOB.  She does states it it better than it was.  Still under quarantine at [facility].  FYI to Dr. Diona Browner.

## 2019-02-22 NOTE — Telephone Encounter (Signed)
Called to check on Catherine Young who is on my respiratory illness call log.  She teste positive for Covid at [facility] on 02/14/2019.  I left a message for her to call back to give [place] an update on how she is doing.

## 2019-02-26 ENCOUNTER — Telehealth: Payer: Self-pay | Admitting: Family Medicine

## 2019-02-26 NOTE — Telephone Encounter (Signed)
Left message on voicemail for patient to call back. 

## 2019-02-26 NOTE — Telephone Encounter (Signed)
Patient called back stating that she felt really bad yesterday. Patient stated that she feels a little better today. Patient stated that she had a fever Sunday all day of a 100.0 and felt really bad. Patient stated that she still has a headache, productive cough/yellow and a fever of 99.5.  Patient stated that she is taking Ibuprofen and Robitussin for her cough which helps some. Patient stated that her back hurts when she coughs. Patient denies any difficulty breathing or SOB. Patient stated that she has no energy. Patient was advised to push fluids, rest and eat well-balanced meals. Patient wanted to know how long this is going to last. Advised patient that each individual is different.  ER precautions were given to patient and she verbalized understanding.

## 2019-02-26 NOTE — Telephone Encounter (Signed)
Catherine Young , can you please call and triage.

## 2019-02-26 NOTE — Telephone Encounter (Signed)
If pt not continuing to improve over next 2 days.. should do another virtual visit  on Thursday to consider  respiratory clinic eval.

## 2019-02-26 NOTE — Telephone Encounter (Signed)
Patient is requesting a call back She stated she tested positive for covid and is not feeling well at all.   Patient would like to know what she can do to help.  She stated that yesterday was just a bad day and wanted to call in today to see if she can get some advice on what she can do for relief

## 2019-02-26 NOTE — Telephone Encounter (Signed)
Left another message on voicemail for patient to call back. 

## 2019-02-26 NOTE — Telephone Encounter (Signed)
Attempted to reach patient. No answer. Vm left to call back  

## 2019-02-27 NOTE — Telephone Encounter (Signed)
Virtual visit scheduled with Dr. Diona Browner on 02/28/2019 at 11:20 am.

## 2019-02-28 ENCOUNTER — Encounter: Payer: Self-pay | Admitting: Family Medicine

## 2019-02-28 ENCOUNTER — Other Ambulatory Visit: Payer: Self-pay

## 2019-02-28 ENCOUNTER — Ambulatory Visit (INDEPENDENT_AMBULATORY_CARE_PROVIDER_SITE_OTHER): Payer: Medicare PPO | Admitting: Family Medicine

## 2019-02-28 ENCOUNTER — Telehealth: Payer: Self-pay | Admitting: Family Medicine

## 2019-02-28 VITALS — Temp 99.5°F | Ht 65.5 in | Wt 112.0 lb

## 2019-02-28 DIAGNOSIS — U071 COVID-19: Secondary | ICD-10-CM

## 2019-02-28 NOTE — Progress Notes (Signed)
VIRTUAL VISIT Due to national recommendations of social distancing due to [place] 19, a virtual visit is felt to be most appropriate for this patient at this time.   I connected with the patient on 02/28/19 at 11:20 AM EST by virtual telehealth platform and verified that I am speaking with the correct person using two identifiers.   I discussed the limitations, risks, security and privacy concerns of performing an evaluation and management service by  virtual telehealth platform and the availability of in person appointments. I also discussed with the patient that there may be a patient responsible charge related to this service. The patient expressed understanding and agreed to proceed.  Patient location: Home Provider Location: [facility] Participants: Eliezer Lofts and Lorette Ang   Chief Complaint  Patient presents with  . Cough    [place] has taken her off quarantine  . Headache    come and go  . Fever    low grade    History of Present Illness:  86 year old female presents for COVID 19 infection and persistent cough intermittent, headache and low grade temperature.   She had positive COVID test on 02/13/2019 She has completed her quarantine but continues to has symptoms. She reports that  She is overall better. Less headaches, coughing more intermittent. Cough is moist now in last 2-3 days. No body aches, no ST. No SOB, no wheeze. Able to do activity, but still weak and tired easily. Continued low grade temp 99.5-100.9 F  Daily. Highest 2 days ago.   COVID 19 screen No recent travel or known exposure to COVID19 The patient denies respiratory symptoms of COVID 19 at this time.  The importance of social distancing was discussed today.   Review of Systems  HENT: Negative for congestion and ear pain.   Eyes: Negative for pain and redness.  Respiratory: Positive for cough. Negative for shortness of breath.   Cardiovascular: Negative for chest pain,  palpitations and leg swelling.  Gastrointestinal: Negative for abdominal pain, blood in stool, constipation, diarrhea, nausea and vomiting.  Genitourinary: Negative for dysuria.  Musculoskeletal: Negative for falls and myalgias.  Skin: Negative for rash.  Neurological: Positive for headaches. Negative for dizziness.  Psychiatric/Behavioral: Negative for depression. The patient is not nervous/anxious.       Past Medical History:  Diagnosis Date  . Arthritis   . Basal cell carcinoma of tragus, left   . Cataract    right  . Complication of anesthesia    COULD'T MOVE WHEN WAKING UP AFTER [place] COLON SURGERY  . Depression   . Hypertension   . Intestinal obstruction ([place])   . Osteoporosis     reports that she quit smoking about 26 years ago. She has never used smokeless tobacco. She reports current alcohol use of about 7.0 standard drinks of alcohol per week. She reports that she does not use drugs.   Current Outpatient Medications:  .  buPROPion (WELLBUTRIN XL) 150 MG 24 hr tablet, Take 1 tablet (150 mg total) by mouth daily., Disp: 30 tablet, Rfl: 11 .  Cholecalciferol (VITAMIN D3) 50 MCG (2000 UT) capsule, Take 2,000 Units by mouth daily., Disp: , Rfl:  .  clonazePAM (KLONOPIN) 1 MG tablet, Take 0.5 tablets (0.5 mg total) by mouth 2 (two) times daily as needed for anxiety., Disp: 90 tablet, Rfl: 0 .  sertraline (ZOLOFT) 100 MG tablet, Take 1 tablet (100 mg total) by mouth at bedtime., Disp: 90 tablet, Rfl: 1 .  traZODone (  DESYREL) 100 MG tablet, Take 1 tablet (100 mg total) by mouth at bedtime as needed for sleep., Disp: 90 tablet, Rfl: 0   Observations/Objective: Temperature 99.5 F (37.5 C), temperature source Temporal, height 5' 5.5" (1.664 m), weight 112 lb (50.8 kg).  Physical Exam  Physical Exam Constitutional:      General: The patient is not in acute distress. Pulmonary:     Effort: Pulmonary effort is normal. No respiratory distress.  Neurological:     Mental Status:  The patient is alert and oriented to person, place, and time.  Psychiatric:        Mood and Affect: Mood normal.        Behavior: Behavior normal.   Assessment and Plan COVID-19 virus infection Gradually improving symptoms but with new worsening. Continue supportive care and rest. If elevated temp not improving will plan in person appt in respiratory clinic for CXR and lung exam.     I discussed the assessment and treatment plan with the patient. The patient was provided an opportunity to ask questions and all were answered. The patient agreed with the plan and demonstrated an understanding of the instructions.   The patient was advised to call back or seek an in-person evaluation if the symptoms worsen or if the condition fails to improve as anticipated.    Eliezer Lofts, MD

## 2019-02-28 NOTE — Telephone Encounter (Signed)
Left message asking pt to call office   Carter Kitten, CMA  Darl Householder  Ms. Terance Hart had a virtual visit today with Dr. Diona Browner and she states she now has [employer] and is worried that we don't have her new insurance information. She doesn't want anything to be filed with [facility]. Can you reach out to her to get her new insurance information.   Thanks, Butch Penny

## 2019-03-01 NOTE — Telephone Encounter (Signed)
Pt called back with insurance information

## 2019-03-04 ENCOUNTER — Telehealth: Payer: Self-pay | Admitting: Family Medicine

## 2019-03-04 NOTE — Telephone Encounter (Signed)
Patient had a virtual visit with Dr. Diona Browner on Friday.  Dr.Bedsole asked patient to call back with an update on her condition.  Patient said she's feeling much better. Patient still has fatigue and patient has been coughing a lot today.  Patient said her temperature is 99.5.

## 2019-03-05 NOTE — Telephone Encounter (Signed)
Pt stated feeling better but still coughing and low grade temp.. recommend appt in respiratory clinic or at urgent care for lung exam. Please call to offer to patient.

## 2019-03-06 ENCOUNTER — Ambulatory Visit: Admission: RE | Admit: 2019-03-06 | Payer: Medicare PPO | Source: Ambulatory Visit | Admitting: *Deleted

## 2019-03-06 ENCOUNTER — Other Ambulatory Visit: Payer: Self-pay

## 2019-03-06 ENCOUNTER — Ambulatory Visit
Admission: RE | Admit: 2019-03-06 | Discharge: 2019-03-06 | Disposition: A | Discharge status: Home / Self Care | Payer: Medicare PPO | Source: Ambulatory Visit | Attending: Internal Medicine | Admitting: Internal Medicine

## 2019-03-06 ENCOUNTER — Other Ambulatory Visit: Payer: Self-pay | Admitting: Internal Medicine

## 2019-03-06 ENCOUNTER — Ambulatory Visit (INDEPENDENT_AMBULATORY_CARE_PROVIDER_SITE_OTHER): Payer: Medicare PPO | Admitting: Internal Medicine

## 2019-03-06 VITALS — BP 160/70 | HR 62 | Temp 98.9°F | Wt 112.0 lb

## 2019-03-06 DIAGNOSIS — R05 Cough: Secondary | ICD-10-CM

## 2019-03-06 DIAGNOSIS — F331 Major depressive disorder, recurrent, moderate: Secondary | ICD-10-CM

## 2019-03-06 DIAGNOSIS — R059 Cough, unspecified: Secondary | ICD-10-CM

## 2019-03-06 DIAGNOSIS — R509 Fever, unspecified: Secondary | ICD-10-CM

## 2019-03-06 DIAGNOSIS — U071 COVID-19: Secondary | ICD-10-CM

## 2019-03-06 LAB — POCT URINALYSIS DIPSTICK
Bilirubin, UA: NEGATIVE
Glucose, UA: NEGATIVE
Ketones, UA: NEGATIVE
Leukocytes, UA: NEGATIVE
Nitrite, UA: NEGATIVE
Protein, UA: POSITIVE — AB
Spec Grav, UA: 1.01 (ref 1.010–1.025)
Urobilinogen, UA: 0.2 E.U./dL
pH, UA: 6 (ref 5.0–8.0)

## 2019-03-06 NOTE — Progress Notes (Signed)
This visit occurred during the SARS-CoV-2 public health emergency.  Safety protocols were in place, including screening questions prior to the visit, additional usage of staff PPE, and extensive cleaning of exam room while observing appropriate contact time as indicated for disinfecting solutions.  Subjective:     Patient ID: Catherine Young , female    DOB: February 11, 1931 , 86 y.o.   MRN: [id number]   Chief Complaint  Patient presents with  . Cough    tested + for covid on 1/21--still having low grade temps--still having cough with pale yellow sputum. occ headache, fatigue    HPI Pt was diagnosed with covid on 1/21. She developed HA, chills, aches the night of the day she received the 1st COVID mRNA gene therapy injection. She will not get the second one per recommendation of her PCP. She has been feeling better this week and her HA finally resolved, but due to still having fevers her PCP wanted her to be seen. She does not know how she got covid, since She has been isolated from people except going to the grocery store or pharmacy. Her temps have been 99.9-100.5 this week. Yesterday was 100.5 and her PCP wanted her to be rechecked.  Has been eating fine and drinking lots of water.  Denies SOB, gets easily fatigued with activity after 30 minules when she cleans her house, but this is better compared to the week of 1/21 Her cough is productive with light cream color mucous. The last 2 days has noticed PND.   Past Medical History:  Diagnosis Date  . Arthritis   . Basal cell carcinoma of tragus, left   . Cataract    right  . Complication of anesthesia    COULD'T MOVE WHEN WAKING UP AFTER [place] COLON SURGERY  . Depression   . Hypertension   . Intestinal obstruction ([place])   . Osteoporosis      Family History  Problem Relation Age of Onset  . Arthritis Mother   . Colon cancer Mother   . Cancer Mother        colon  . Diabetes Father   . Arthritis Sister   . Colon cancer Sister    . Cancer Sister        colon  . Lung cancer Brother   . Cancer Brother        lung  . Arthritis Sister   . Colon cancer Sister   . Cancer Sister        colon     Current Outpatient Medications:  .  Cholecalciferol (VITAMIN D3) 50 MCG (2000 UT) capsule, Take 2,000 Units by mouth daily., Disp: , Rfl:  .  clonazePAM (KLONOPIN) 1 MG tablet, Take 0.5 tablets (0.5 mg total) by mouth 2 (two) times daily as needed for anxiety., Disp: 90 tablet, Rfl: 0 .  sertraline (ZOLOFT) 100 MG tablet, Take 1 tablet (100 mg total) by mouth at bedtime., Disp: 90 tablet, Rfl: 1 .  traZODone (DESYREL) 100 MG tablet, Take 1 tablet (100 mg total) by mouth at bedtime as needed for sleep., Disp: 90 tablet, Rfl: 0 .  buPROPion (WELLBUTRIN XL) 150 MG 24 hr tablet, Take 1 tablet (150 mg total) by mouth daily. (Patient not taking: Reported on 03/06/2019), Disp: 30 tablet, Rfl: 11   No Known Allergies   Review of Systems  Denies diarrhea, N/V, SOB, CP, edema, fever, rashes, + for getting easily fatigued with activity, and occasional HA's. Her BP has been up and down from  [id number] systolic but is not on medication. Has not checked her BP at home since her battery is low.  Denies orthopnea or UTI symptoms.  Today's Vitals   03/06/19 1755  BP: (!) 152/92  Pulse: 62  Temp: 98.9 F (37.2 C)  TempSrc: Oral  SpO2: 98%  Weight: 112 lb (50.8 kg)   Body mass index is 18.35 kg/m.   Objective:  Physical Exam  Constitutional: She is oriented to person, place, and time. She appears well-developed and well-nourished. No distress.  HENT: TM's are gray and shiny. Pharynx is clear. NOSE- mild erythematous mucosa with clear mucous.  Head: Normocephalic and atraumatic.  Right Ear: External ear normal.  Left Ear: External ear normal.  Nose: Nose normal.  Eyes: Conjunctivae are normal. Right eye exhibits no discharge. Left eye exhibits no discharge. No scleral icterus.  Neck: Neck supple. No thyromegaly present.  No carotid  bruits bilaterally  Cardiovascular: Normal rate and regular rhythm.  No murmur heard. Pulmonary/Chest: Effort normal and breath sounds normal. No respiratory distress.  Musculoskeletal: Normal range of motion. She exhibits no edema.  Lymphadenopathy:    She has no cervical adenopathy.  Neurological: She is alert and oriented to person, place, and time.  Skin: Skin is warm and dry. Capillary refill takes less than 2 seconds. No rash noted. She is not diaphoretic.  Psychiatric: She has a normal mood and affect. Her behavior is normal. Judgment and thought content normal.  Nursing note reviewed. EKG- abnormal UA- trace blood and protein, the rest neg.  CXR- negative for pneumonia Assessment And Plan:     1. COVID-19 virus infection- with improved symptoms, but persistent low grade fever. She is afebrile here.   2. Cough- resolving slowly. Does not have pneumonia.  - DG Chest 2 View; Future  3. -Fever- unresolved. CBC with diff and CMP ordered to be done tomorrow am.  4.- Elevated blood pressure- chronic up and down BP. I reviewed her past BP's and they have been from Q000111Q systolic.    5-  Abnormal EKG- EKG done today but did not have an old one to compare. This shows  Regular supraventricular rhythm, R ventricular hypertrophy, and non specific T wave changes laterally.  Needs to Fu with PCP to be referred to cardiology.   Yailene Badia RODRIGUEZ-SOUTHWORTH, PA-C    THE PATIENT IS ENCOURAGED TO PRACTICE SOCIAL DISTANCING DUE TO THE COVID-19 PANDEMIC.

## 2019-03-06 NOTE — Patient Instructions (Addendum)
  Do  Blood pressure diaries and write them down to let yur family Dr know if your readings are still high.   Go to [facility] to get blood work done tomorrow am, so your results are ready later on that day.   Your EKG shows slow heart rate and the Right side of your heart being enlarged, you need to see a cardiologist since I dont know if this is new or now for you, because I dont have an old EKG to compare.   If your family Dr send you to a cardiologist, Dr Lavera Guise at the [facility] can get you in within a week to do an echocardiogram and stress test is needed. I work at this clinic on Fridays. He is there the other days of the week.   [place] [place], [place] [address]  [phone]

## 2019-03-06 NOTE — Telephone Encounter (Signed)
[facility] Night - Client Nonclinical Telephone Record AccessNurse Client [facility] Night - Client Client Site [place] - Night Physician Eliezer Lofts - MD Contact Type Call Who Is Calling Patient / Member / Family / Caregiver Caller Name Darlisha Booty Caller Phone Number [phone] Patient Name Catherine Young Patient DOB Jun 02, 1931 Call Type Message Only Information Provided Reason for Call Request for General Office Information Initial Comment Caller states she is getting over Covid and needs to decide if she will get a chest x-ray and if she needs the second Covid vaccine. She has been trying to call the office since yesterday. Additional Comment Disp. Time Disposition Final User 03/05/2019 6:01:23 PM General Information Provided Yes Phillips Hay Call Closed By: Phillips Hay Transaction Date/Time: 03/05/2019 5:57:04 PM (ET)

## 2019-03-06 NOTE — Telephone Encounter (Signed)
[facility] Night - Client Nonclinical Telephone Record AccessNurse Client [place] Night - Client Client Site [place] - Night Physician Eliezer Lofts - MD Contact Type Call Who Is Calling Patient / Member / Family / Caregiver Caller Name [place] Phone Number [phone] Call Type Message Only Information Provided Reason for Call Returning a Call from the Office Initial [place] states she is returning a call. Additional Comment Disp. Time Disposition Final User 03/05/2019 5:49:51 PM General Information Provided Yes Jowers, April Call Closed By: April Jowers Transaction Date/Time: 03/05/2019 5:48:05 PM (ET)

## 2019-03-06 NOTE — Telephone Encounter (Signed)
Catherine Young notified as instructed by telephone.  Appointment scheduled at [facility] in [place] for tonight 03/06/2019 at 6:00 pm.

## 2019-03-08 NOTE — Progress Notes (Signed)
Spoke with [name].  She states when she checked it today at lunch it was 99 degrees but she states it has been back in 98's some lately as well.

## 2019-03-08 NOTE — Progress Notes (Signed)
Lets hold on further lab testing at this point. Have her call if symptoms worsen.   CXR was clear.

## 2019-03-08 NOTE — Progress Notes (Signed)
Has fever resolved now?

## 2019-03-08 NOTE — Progress Notes (Signed)
Call patient:  Reviewed note from urgent care. Glad  to hear lung eval and CXR clear.  EKG was slightly abnormal... if interested I can refer to cardiologist for further eval.

## 2019-03-08 NOTE — Progress Notes (Signed)
Left message for Ms. Stringfellow to return my call.  

## 2019-03-08 NOTE — Progress Notes (Signed)
Catherine Young notified as instructed by telephone.  She does not want to see a cardiologist at this time.  She is also inquiring about the lab test that were ordered by the PA at the respiratory clinic.  She states she was told to go the [facility] to have the labs drawn (CMET & CBC w/ Diff.  She states if she goes to [facility], [place] will make her quarantine again.  She is wanting to know if these labs are really necessary.  Please advise.  I did tell her that if Dr. Diona Browner wants those labs, we can bring her here to our office and maybe do a car draw.

## 2019-03-11 ENCOUNTER — Encounter (INDEPENDENT_AMBULATORY_CARE_PROVIDER_SITE_OTHER): Payer: Self-pay

## 2019-03-11 NOTE — Progress Notes (Signed)
Left message for Catherine Young that since her CXR was clear, Dr. Diona Browner said we could hold off on the labs for now.  I ask that she call us back if her symptoms were to worsen or if she has any questions.

## 2019-03-12 ENCOUNTER — Encounter (INDEPENDENT_AMBULATORY_CARE_PROVIDER_SITE_OTHER): Payer: Self-pay

## 2019-03-16 ENCOUNTER — Encounter (INDEPENDENT_AMBULATORY_CARE_PROVIDER_SITE_OTHER): Payer: Self-pay

## 2019-03-18 ENCOUNTER — Encounter (INDEPENDENT_AMBULATORY_CARE_PROVIDER_SITE_OTHER): Payer: Self-pay

## 2019-03-19 ENCOUNTER — Encounter (INDEPENDENT_AMBULATORY_CARE_PROVIDER_SITE_OTHER): Payer: Self-pay

## 2019-03-22 ENCOUNTER — Telehealth: Payer: Self-pay

## 2019-03-22 NOTE — Telephone Encounter (Signed)
Left message for Ms. Luebke to return my call.  

## 2019-03-22 NOTE — Telephone Encounter (Signed)
Pt left a message on Triage Line asking to speak to Butch Penny about a medication issue with [employer] and [employer].   Called pt back to get more information. She has not called back.

## 2019-03-22 NOTE — Telephone Encounter (Signed)
Patient contacted the office. She states her insurance requires that she use [facility] on long term medications, and she wanted to make sure that [employer] was in her chart. I have added in [employer] in the chart.

## 2019-03-25 ENCOUNTER — Other Ambulatory Visit: Payer: Self-pay | Admitting: *Deleted

## 2019-03-25 MED ORDER — TRAZODONE HCL 100 MG PO TABS: 100.0000 mg | ORAL_TABLET | Freq: Every evening | ORAL | 1 refills | Status: DC | PRN

## 2019-03-25 NOTE — Telephone Encounter (Signed)
Last office visit 02/28/2019 for Cough/Fever/HA.  Last refilled 01/01/2019 for #90 with no refills.  Next Appt: 04/30/2019 for 3 month follow up.

## 2019-03-26 MED ORDER — CLONAZEPAM 1 MG PO TABS: 0.5000 mg | ORAL_TABLET | Freq: Two times a day (BID) | ORAL | 0 refills | Status: DC | PRN

## 2019-04-01 NOTE — Assessment & Plan Note (Signed)
Gradually improving symptoms but with new worsening. Continue supportive care and rest. If elevated temp not improving will plan in person appt in respiratory clinic for CXR and lung exam.

## 2019-04-10 DIAGNOSIS — H2512 Age-related nuclear cataract, left eye: Secondary | ICD-10-CM | POA: Diagnosis not present

## 2019-04-11 DIAGNOSIS — G8929 Other chronic pain: Secondary | ICD-10-CM | POA: Diagnosis not present

## 2019-04-11 DIAGNOSIS — M48062 Spinal stenosis, lumbar region with neurogenic claudication: Secondary | ICD-10-CM | POA: Diagnosis not present

## 2019-04-11 DIAGNOSIS — M5416 Radiculopathy, lumbar region: Secondary | ICD-10-CM | POA: Diagnosis not present

## 2019-04-11 DIAGNOSIS — M25562 Pain in left knee: Secondary | ICD-10-CM | POA: Diagnosis not present

## 2019-04-11 DIAGNOSIS — M25561 Pain in right knee: Secondary | ICD-10-CM | POA: Diagnosis not present

## 2019-04-11 DIAGNOSIS — M5136 Other intervertebral disc degeneration, lumbar region: Secondary | ICD-10-CM | POA: Diagnosis not present

## 2019-04-11 DIAGNOSIS — M25461 Effusion, right knee: Secondary | ICD-10-CM | POA: Diagnosis not present

## 2019-04-11 DIAGNOSIS — M170 Bilateral primary osteoarthritis of knee: Secondary | ICD-10-CM | POA: Diagnosis not present

## 2019-04-16 DIAGNOSIS — M48062 Spinal stenosis, lumbar region with neurogenic claudication: Secondary | ICD-10-CM | POA: Diagnosis not present

## 2019-04-16 DIAGNOSIS — M5416 Radiculopathy, lumbar region: Secondary | ICD-10-CM | POA: Diagnosis not present

## 2019-04-16 DIAGNOSIS — M5136 Other intervertebral disc degeneration, lumbar region: Secondary | ICD-10-CM | POA: Diagnosis not present

## 2019-04-30 ENCOUNTER — Other Ambulatory Visit: Payer: Self-pay

## 2019-04-30 ENCOUNTER — Telehealth: Payer: Self-pay

## 2019-04-30 ENCOUNTER — Ambulatory Visit: Payer: Medicare PPO | Admitting: Family Medicine

## 2019-04-30 ENCOUNTER — Encounter: Payer: Self-pay | Admitting: Family Medicine

## 2019-04-30 VITALS — BP 150/64 | HR 54 | Temp 98.2°F | Ht 65.5 in | Wt 112.0 lb

## 2019-04-30 DIAGNOSIS — R413 Other amnesia: Secondary | ICD-10-CM | POA: Diagnosis not present

## 2019-04-30 DIAGNOSIS — F331 Major depressive disorder, recurrent, moderate: Secondary | ICD-10-CM | POA: Diagnosis not present

## 2019-04-30 DIAGNOSIS — R300 Dysuria: Secondary | ICD-10-CM | POA: Diagnosis not present

## 2019-04-30 DIAGNOSIS — F0390 Unspecified dementia without behavioral disturbance: Secondary | ICD-10-CM | POA: Insufficient documentation

## 2019-04-30 DIAGNOSIS — F411 Generalized anxiety disorder: Secondary | ICD-10-CM

## 2019-04-30 LAB — POC URINALSYSI DIPSTICK (AUTOMATED)
Bilirubin, UA: NEGATIVE
Glucose, UA: NEGATIVE
Ketones, UA: NEGATIVE
Nitrite, UA: POSITIVE
Protein, UA: POSITIVE — AB
Spec Grav, UA: 1.015 (ref 1.010–1.025)
Urobilinogen, UA: 0.2 E.U./dL
pH, UA: 6 (ref 5.0–8.0)

## 2019-04-30 MED ORDER — CEPHALEXIN 500 MG PO CAPS: 500.0000 mg | ORAL_CAPSULE | Freq: Three times a day (TID) | ORAL | 0 refills | Status: DC

## 2019-04-30 NOTE — Patient Instructions (Addendum)
Start antibitotics for UTI and we will call with urine culture results.  Call if interested in Neurocognitive Testing with psycholgist.

## 2019-04-30 NOTE — Assessment & Plan Note (Signed)
Minimal improvement worsened with isolation from COVID.  Offered  Psychiatry referral.. not interested in at this time ( she has seen many in past) will consider.

## 2019-04-30 NOTE — Progress Notes (Signed)
Chief Complaint  Patient presents with  . Follow-up    Mood/Fatigue  . Dysuria    History of Present Illness: HPI    86 year old female  Presents for 3 month follow up mood.  She is on trazodone  For insomnia.  She is on sertraline 100 mg and she is no longer taking bupropion as it was not helpful. She uses clonazepam 1/2 tab at night and occ during the day.   She reports her mood is not very well controlled. [id number], GAD7:15  She has noted worsening of memory since she had COVID in January. She has noted gradually decline in last few years as well.  Cannot remember where she has left things.  She was isolated, and lonely , worse during her [place].  She has also noted dysuria in the last  2 weeks.  She is having urinary urgency and  Not change in frequency .Marland Kitchen occ straining to urinate. Waking once to urinate. No fever,  No incontinence.  no hematuria. Feels like she may not be emptying completely.   Nml B12, TSH, Vit D, CMET, CBC   3 sister with alzheimer's or alcohol induce dementia  UA shows large LE, moderate blood, positive nitrite.  Last  Documented UTI 2 years ago. Prior to that she had frequently.  This visit occurred during the SARS-CoV-2 public health emergency.  Safety protocols were in place, including screening questions prior to the visit, additional usage of staff PPE, and extensive cleaning of exam room while observing appropriate contact time as indicated for disinfecting solutions.   COVID 19 screen:  No recent travel or known exposure to COVID19 The patient denies respiratory symptoms of COVID 19 at this time. The importance of social distancing was discussed today.     Review of Systems  Constitutional: Negative for chills and fever.  HENT: Negative for congestion and ear pain.   Eyes: Negative for pain and redness.  Respiratory: Negative for cough and shortness of breath.   Cardiovascular: Negative for chest pain, palpitations and leg  swelling.  Gastrointestinal: Negative for abdominal pain, blood in stool, constipation, diarrhea, nausea and vomiting.  Genitourinary: Negative for dysuria.  Musculoskeletal: Negative for falls and myalgias.  Skin: Negative for rash.  Neurological: Negative for dizziness.  Psychiatric/Behavioral: Negative for depression. The patient is not nervous/anxious.       Past Medical History:  Diagnosis Date  . Arthritis   . Basal cell carcinoma of tragus, left   . Cataract    right  . Complication of anesthesia    COULD'T MOVE WHEN WAKING UP AFTER [place] COLON SURGERY  . Depression   . Hypertension   . Intestinal obstruction ([place])   . Osteoporosis     reports that she quit smoking about 26 years ago. She has never used smokeless tobacco. She reports current alcohol use of about 7.0 standard drinks of alcohol per week. She reports that she does not use drugs.   Current Outpatient Medications:  .  Cholecalciferol (VITAMIN D3) 50 MCG (2000 UT) capsule, Take 2,000 Units by mouth daily., Disp: , Rfl:  .  clonazePAM (KLONOPIN) 1 MG tablet, Take 0.5 tablets (0.5 mg total) by mouth 2 (two) times daily as needed for anxiety., Disp: 90 tablet, Rfl: 0 .  sertraline (ZOLOFT) 100 MG tablet, Take 1 tablet (100 mg total) by mouth at bedtime., Disp: 90 tablet, Rfl: 1 .  traZODone (DESYREL) 100 MG tablet, Take 1 tablet (100 mg total) by mouth at bedtime  as needed for sleep., Disp: 90 tablet, Rfl: 1   Observations/Objective: Blood pressure (!) 150/64, pulse (!) 54, temperature 98.2 F (36.8 C), temperature source Temporal, height 5' 5.5" (1.664 m), weight 112 lb (50.8 kg), SpO2 97 %.  Physical Exam Constitutional:      General: She is not in acute distress.    Appearance: Normal appearance. She is well-developed. She is not ill-appearing or toxic-appearing.     Comments: Elderly female in NAD  HENT:     Head: Normocephalic.     Right Ear: Hearing, tympanic membrane, ear canal and external ear normal.  Tympanic membrane is not erythematous, retracted or bulging.     Left Ear: Hearing, tympanic membrane, ear canal and external ear normal. Tympanic membrane is not erythematous, retracted or bulging.     Nose: No mucosal edema or rhinorrhea.     Right Sinus: No maxillary sinus tenderness or frontal sinus tenderness.     Left Sinus: No maxillary sinus tenderness or frontal sinus tenderness.     Mouth/Throat:     Pharynx: Uvula midline.  Eyes:     General: Lids are normal. Lids are everted, no foreign bodies appreciated.     Conjunctiva/sclera: Conjunctivae normal.     Pupils: Pupils are equal, round, and reactive to light.  Neck:     Thyroid: No thyroid mass or thyromegaly.     Vascular: No carotid bruit.     Trachea: Trachea normal.  Cardiovascular:     Rate and Rhythm: Normal rate and regular rhythm.     Pulses: Normal pulses.     Heart sounds: Normal heart sounds, S1 normal and S2 normal. No murmur. No friction rub. No gallop.   Pulmonary:     Effort: Pulmonary effort is normal. No tachypnea or respiratory distress.     Breath sounds: Normal breath sounds. No decreased breath sounds, wheezing, rhonchi or rales.  Abdominal:     General: Bowel sounds are normal.     Palpations: Abdomen is soft.     Tenderness: There is no abdominal tenderness.  Musculoskeletal:     Cervical back: Normal range of motion and neck supple.  Skin:    General: Skin is warm and dry.     Findings: No rash.  Neurological:     Mental Status: She is alert and oriented to person, place, and time.     GCS: GCS eye subscore is 4. GCS verbal subscore is 5. GCS motor subscore is 6.     Cranial Nerves: No cranial nerve deficit.     Sensory: No sensory deficit.     Motor: No abnormal muscle tone.     Coordination: Coordination normal.     Gait: Gait normal.     Deep Tendon Reflexes: Reflexes are normal and symmetric.     Comments: Nml cerebellar exam   No papilledema  Psychiatric:        Mood and Affect:  Mood is not anxious or depressed.        Speech: Speech normal.        Behavior: Behavior normal. Behavior is cooperative.        Thought Content: Thought content normal.        Cognition and Memory: Memory is not impaired. She does not exhibit impaired recent memory or impaired remote memory.        Judgment: Judgment normal.      Assessment and Plan Dysuria LIkely uncomplicated UTI. Send urine for culture. Trea with keflex x 7  days. Increase water.  Moderate recurrent major depression ([place]) Minimal improvement worsened with isolation from [place].  Offered  Psychiatry referral.. not interested in at this time ( she has seen many in past) will consider.  Memory loss Strong family history of dementia, multiple causes.  She will consider neurocognitive testing.  LAbs eval for secondary causes negative.  She will consider MRI brain or neuro referral.  Will return in 4 weeks for memory test if decides against neurocognitive testing referral.   Refuses Aricept as it did not help her sister.. discussed in detail.       Eliezer Lofts, MD

## 2019-04-30 NOTE — Telephone Encounter (Signed)
[facility] Night - Young Nonclinical Telephone Record AccessNurse [name] Young Site [place] - Night Physician Eliezer Lofts - MD Contact Type Call Who Is Calling Patient / Member / Family / Caregiver Caller Name Catherine Young Caller Phone Number [phone] Call Type Message Only Information Provided Reason for Call Returning a Call from the Office Initial [place] states she missed a call from [place]. Additional Comment Her cell number is [phone] Disp. Time Disposition Final User 04/29/2019 6:15:37 PM General Information Provided Yes Young, Catherine Rung Call Closed By: Vicente Masson Transaction Date/Time: 04/29/2019 6:14:06 PM (ET)

## 2019-04-30 NOTE — Telephone Encounter (Signed)
Spoke with pt reminded her of appointment and did covid screening

## 2019-04-30 NOTE — Assessment & Plan Note (Signed)
Strong family history of dementia, multiple causes.  She will consider neurocognitive testing.  LAbs eval for secondary causes negative.  She will consider MRI brain or neuro referral.  Will return in 4 weeks for memory test if decides against neurocognitive testing referral.   Refuses Aricept as it did not help her sister.. discussed in detail.

## 2019-04-30 NOTE — Assessment & Plan Note (Signed)
LIkely uncomplicated UTI. Send urine for culture. Trea with keflex x 7 days. Increase water.

## 2019-05-02 LAB — URINE CULTURE
MICRO NUMBER:: 10332292
SPECIMEN QUALITY:: ADEQUATE

## 2019-05-28 ENCOUNTER — Ambulatory Visit: Payer: Medicare PPO | Admitting: Family Medicine

## 2019-05-28 ENCOUNTER — Other Ambulatory Visit: Payer: Self-pay

## 2019-05-28 ENCOUNTER — Encounter: Payer: Self-pay | Admitting: Family Medicine

## 2019-05-28 DIAGNOSIS — R413 Other amnesia: Secondary | ICD-10-CM

## 2019-05-28 NOTE — Patient Instructions (Signed)
Make sure you keep up with healthy lifestyle like adequate sleep and water intake, don't skip meals.  Exercise is what is proven to promote good memory.

## 2019-05-28 NOTE — Progress Notes (Signed)
Chief Complaint  Patient presents with  . Follow-up    Memory Loss    History of Present Illness: HPI    86 year old female presents for follow up memory issues... now ongoing for several years, but gradaully worsening.  No sudden change. She has a strong family history of dementia multiple causes. 3 sisters with dementia.  She feels her mood is doing  Better overall. She feels less isolate.   MMSE: 29/30.Marland Kitchenmissed one short term recall Animal recall:  7 in 30 sec Clock drawing: normal   This visit occurred during the SARS-CoV-2 public health emergency.  Safety protocols were in place, including screening questions prior to the visit, additional usage of staff PPE, and extensive cleaning of exam room while observing appropriate contact time as indicated for disinfecting solutions.   COVID 19 screen:  No recent travel or known exposure to COVID19 The patient denies respiratory symptoms of COVID 19 at this time. The importance of social distancing was discussed today.     ROS    Past Medical History:  Diagnosis Date  . Arthritis   . Basal cell carcinoma of tragus, left   . Cataract    right  . Complication of anesthesia    COULD'T MOVE WHEN WAKING UP AFTER [place] COLON SURGERY  . Depression   . Hypertension   . Intestinal obstruction ([place])   . Osteoporosis     reports that she quit smoking about 26 years ago. She has never used smokeless tobacco. She reports current alcohol use of about 7.0 standard drinks of alcohol per week. She reports that she does not use drugs.   Current Outpatient Medications:  .  Cholecalciferol (VITAMIN D3) 50 MCG (2000 UT) capsule, Take 2,000 Units by mouth daily., Disp: , Rfl:  .  clonazePAM (KLONOPIN) 1 MG tablet, Take 0.5 tablets (0.5 mg total) by mouth 2 (two) times daily as needed for anxiety., Disp: 90 tablet, Rfl: 0 .  sertraline (ZOLOFT) 100 MG tablet, Take 1 tablet (100 mg total) by mouth at bedtime., Disp: 90 tablet, Rfl: 1 .   traZODone (DESYREL) 100 MG tablet, Take 1 tablet (100 mg total) by mouth at bedtime as needed for sleep., Disp: 90 tablet, Rfl: 1   Observations/Objective: Blood pressure (!) 150/66, pulse 61, temperature 98.2 F (36.8 C), temperature source Temporal, height 5' 5.5" (1.664 m), weight 111 lb 12 oz (50.7 kg), SpO2 94 %.  Physical Exam Constitutional:      General: She is not in acute distress.    Appearance: Normal appearance. She is well-developed. She is not ill-appearing or toxic-appearing.  HENT:     Head: Normocephalic.     Right Ear: Hearing, tympanic membrane, ear canal and external ear normal. Tympanic membrane is not erythematous, retracted or bulging.     Left Ear: Hearing, tympanic membrane, ear canal and external ear normal. Tympanic membrane is not erythematous, retracted or bulging.     Nose: No mucosal edema or rhinorrhea.     Right Sinus: No maxillary sinus tenderness or frontal sinus tenderness.     Left Sinus: No maxillary sinus tenderness or frontal sinus tenderness.     Mouth/Throat:     Pharynx: Uvula midline.  Eyes:     General: Lids are normal. Lids are everted, no foreign bodies appreciated.     Conjunctiva/sclera: Conjunctivae normal.     Pupils: Pupils are equal, round, and reactive to light.  Neck:     Thyroid: No thyroid mass or thyromegaly.  Vascular: No carotid bruit.     Trachea: Trachea normal.  Cardiovascular:     Rate and Rhythm: Normal rate and regular rhythm.     Pulses: Normal pulses.     Heart sounds: Normal heart sounds, S1 normal and S2 normal. No murmur. No friction rub. No gallop.   Pulmonary:     Effort: Pulmonary effort is normal. No tachypnea or respiratory distress.     Breath sounds: Normal breath sounds. No decreased breath sounds, wheezing, rhonchi or rales.  Abdominal:     General: Bowel sounds are normal.     Palpations: Abdomen is soft.     Tenderness: There is no abdominal tenderness.  Musculoskeletal:     Cervical back:  Normal range of motion and neck supple.  Skin:    General: Skin is warm and dry.     Findings: No rash.  Neurological:     Mental Status: She is alert.  Psychiatric:        Mood and Affect: Mood is not anxious or depressed.        Speech: Speech normal.        Behavior: Behavior normal. Behavior is cooperative.        Thought Content: Thought content normal.        Judgment: Judgment normal.      Assessment and Plan   Memory loss Very mild. Pt not interested in medication.  Neg labs eval for secondary causes.  Encouraged pt to stay active and continue working on mood.  Will follow overtime.    Eliezer Lofts, MD

## 2019-05-28 NOTE — Assessment & Plan Note (Signed)
Very mild. Pt not interested in medication.  Neg labs eval for secondary causes.  Encouraged pt to stay active and continue working on mood.  Will follow overtime.

## 2019-06-22 ENCOUNTER — Other Ambulatory Visit: Payer: Self-pay | Admitting: Family Medicine

## 2019-06-26 ENCOUNTER — Encounter: Payer: Self-pay | Admitting: Internal Medicine

## 2019-06-26 ENCOUNTER — Ambulatory Visit: Payer: Medicare PPO | Admitting: Internal Medicine

## 2019-06-26 ENCOUNTER — Other Ambulatory Visit: Payer: Self-pay

## 2019-06-26 ENCOUNTER — Ambulatory Visit (INDEPENDENT_AMBULATORY_CARE_PROVIDER_SITE_OTHER)
Admission: RE | Admit: 2019-06-26 | Discharge: 2019-06-26 | Disposition: A | Discharge status: Home / Self Care | Payer: Medicare PPO | Source: Ambulatory Visit | Attending: Internal Medicine | Admitting: Internal Medicine

## 2019-06-26 VITALS — BP 122/88 | HR 54 | Temp 97.9°F | Ht 65.5 in | Wt 115.0 lb

## 2019-06-26 DIAGNOSIS — M25572 Pain in left ankle and joints of left foot: Secondary | ICD-10-CM | POA: Insufficient documentation

## 2019-06-26 DIAGNOSIS — M25472 Effusion, left ankle: Secondary | ICD-10-CM | POA: Diagnosis not present
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
## 2019-06-26 NOTE — Patient Instructions (Signed)
Please get an elastic brace for your ankle and wear it all day. You can ice the ankle if it swells or hurts. If you have ongoing pain, please set up an appointment with our sports medicine specialist--Dr Copland.

## 2019-06-26 NOTE — Assessment & Plan Note (Addendum)
Likely just a sprain Will check x-ray  Has small medial bone avulsion noted. Reviewed with Dr Jamison Neighbor not related to the injury No other fracture Will have her wear elastic brace Ice if swells--like at the end of the day

## 2019-07-16 ENCOUNTER — Other Ambulatory Visit: Payer: Self-pay | Admitting: Family Medicine

## 2019-07-16 NOTE — Telephone Encounter (Signed)
Last office visit 06/26/2019 with Dr. Silvio Pate for acute left ankle pain.  Last refilled 03/26/2019 for #90 with no refills.  No future appointments.

## 2019-09-02 DIAGNOSIS — M5136 Other intervertebral disc degeneration, lumbar region: Secondary | ICD-10-CM | POA: Diagnosis not present

## 2019-09-02 DIAGNOSIS — M48062 Spinal stenosis, lumbar region with neurogenic claudication: Secondary | ICD-10-CM | POA: Diagnosis not present

## 2019-09-02 DIAGNOSIS — M5416 Radiculopathy, lumbar region: Secondary | ICD-10-CM | POA: Diagnosis not present

## 2019-09-11 DIAGNOSIS — M5416 Radiculopathy, lumbar region: Secondary | ICD-10-CM | POA: Diagnosis not present

## 2019-09-11 DIAGNOSIS — M48062 Spinal stenosis, lumbar region with neurogenic claudication: Secondary | ICD-10-CM | POA: Diagnosis not present

## 2019-09-11 DIAGNOSIS — M5136 Other intervertebral disc degeneration, lumbar region: Secondary | ICD-10-CM | POA: Diagnosis not present

## 2019-09-20 ENCOUNTER — Other Ambulatory Visit: Payer: Self-pay | Admitting: Family Medicine

## 2019-10-07 DIAGNOSIS — M5136 Other intervertebral disc degeneration, lumbar region: Secondary | ICD-10-CM | POA: Diagnosis not present

## 2019-10-07 DIAGNOSIS — M48062 Spinal stenosis, lumbar region with neurogenic claudication: Secondary | ICD-10-CM | POA: Diagnosis not present

## 2019-10-07 DIAGNOSIS — M5416 Radiculopathy, lumbar region: Secondary | ICD-10-CM | POA: Diagnosis not present

## 2019-10-21 ENCOUNTER — Emergency Department
Admission: EM | Admit: 2019-10-21 | Discharge: 2019-10-21 | Disposition: A | Discharge status: Home / Self Care | Payer: Medicare PPO | Attending: Emergency Medicine | Admitting: Emergency Medicine

## 2019-10-21 ENCOUNTER — Other Ambulatory Visit: Payer: Self-pay

## 2019-10-21 ENCOUNTER — Emergency Department: Payer: Medicare PPO

## 2019-10-21 DIAGNOSIS — R0602 Shortness of breath: Secondary | ICD-10-CM | POA: Insufficient documentation

## 2019-10-21 DIAGNOSIS — R079 Chest pain, unspecified: Secondary | ICD-10-CM | POA: Insufficient documentation

## 2019-10-21 DIAGNOSIS — M79646 Pain in unspecified finger(s): Secondary | ICD-10-CM | POA: Insufficient documentation

## 2019-10-21 DIAGNOSIS — Z5321 Procedure and treatment not carried out due to patient leaving prior to being seen by health care provider: Secondary | ICD-10-CM | POA: Diagnosis not present

## 2019-10-21 LAB — CBC
HCT: 40.9 % (ref 36.0–46.0)
Hemoglobin: 13.3 g/dL (ref 12.0–15.0)
MCH: 32.4 pg (ref 26.0–34.0)
MCHC: 32.5 g/dL (ref 30.0–36.0)
MCV: 99.5 fL (ref 80.0–100.0)
Platelets: 220 K/uL (ref 150–400)
RBC: 4.11 MIL/uL (ref 3.87–5.11)
RDW: 12.4 % (ref 11.5–15.5)
WBC: 7 K/uL (ref 4.0–10.5)
nRBC: 0 % (ref 0.0–0.2)

## 2019-10-21 LAB — BASIC METABOLIC PANEL WITH GFR
Anion gap: 10 (ref 5–15)
BUN: 12 mg/dL (ref 8–23)
CO2: 28 mmol/L (ref 22–32)
Calcium: 9.8 mg/dL (ref 8.9–10.3)
Chloride: 99 mmol/L (ref 98–111)
Creatinine, Ser: 0.8 mg/dL (ref 0.44–1.00)
GFR calc Af Amer: >60 mL/min
GFR calc non Af Amer: >60 mL/min
Glucose, Bld: 103 mg/dL — ABNORMAL HIGH (ref 70–99)
Potassium: 4.1 mmol/L (ref 3.5–5.1)
Sodium: 137 mmol/L (ref 135–145)

## 2019-10-21 LAB — TROPONIN I (HIGH SENSITIVITY): Troponin I (High Sensitivity): 24 ng/L — ABNORMAL HIGH (ref <18)
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
## 2019-10-22 ENCOUNTER — Telehealth: Payer: Self-pay | Admitting: Emergency Medicine
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
## 2019-10-22 NOTE — Telephone Encounter (Signed)
Call pt to set up follow up ER visit.

## 2019-10-23 NOTE — Telephone Encounter (Signed)
Appointment schedule for 10/25/2019 at 10:40 am with Dr. Diona Browner.

## 2019-10-23 NOTE — Telephone Encounter (Signed)
Can this be done virtual?

## 2019-10-23 NOTE — Telephone Encounter (Signed)
It really needs to be in person.. so we can check BP in office and recheck troponin, possibe ENG given ER labs and vitals.

## 2019-10-25 ENCOUNTER — Ambulatory Visit (INDEPENDENT_AMBULATORY_CARE_PROVIDER_SITE_OTHER): Payer: Medicare PPO | Admitting: Family Medicine

## 2019-10-25 ENCOUNTER — Other Ambulatory Visit: Payer: Self-pay

## 2019-10-25 ENCOUNTER — Encounter: Payer: Self-pay | Admitting: Family Medicine

## 2019-10-25 VITALS — BP 130/74 | HR 61 | Temp 98.3°F | Ht 65.5 in | Wt 110.5 lb

## 2019-10-25 DIAGNOSIS — R778 Other specified abnormalities of plasma proteins: Secondary | ICD-10-CM | POA: Diagnosis not present

## 2019-10-25 DIAGNOSIS — Z23 Encounter for immunization: Secondary | ICD-10-CM | POA: Diagnosis not present

## 2019-10-25 DIAGNOSIS — R030 Elevated blood-pressure reading, without diagnosis of hypertension: Secondary | ICD-10-CM

## 2019-10-25 DIAGNOSIS — R55 Syncope and collapse: Secondary | ICD-10-CM

## 2019-10-25 DIAGNOSIS — M7918 Myalgia, other site: Secondary | ICD-10-CM | POA: Diagnosis not present

## 2019-10-25 LAB — TSH: TSH: 1.41 u[IU]/mL (ref 0.35–4.50)

## 2019-10-25 LAB — VITAMIN B12: Vitamin B-12: 976 pg/mL — ABNORMAL HIGH (ref 211–911)

## 2019-10-25 LAB — TROPONIN I (HIGH SENSITIVITY): High Sens Troponin I: 6 ng/L (ref 2–17)

## 2019-10-25 NOTE — Patient Instructions (Addendum)
We will call you to set up cardiology referral. Tylenol ES for pain. No driving.  Please stop at the lab to have labs drawn.
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
## 2019-11-11 ENCOUNTER — Emergency Department
Admission: EM | Admit: 2019-11-11 | Discharge: 2019-11-11 | Disposition: A | Discharge status: Home / Self Care | Payer: Medicare PPO | Attending: Emergency Medicine | Admitting: Emergency Medicine

## 2019-11-11 ENCOUNTER — Other Ambulatory Visit: Payer: Self-pay

## 2019-11-11 ENCOUNTER — Emergency Department: Payer: Medicare PPO

## 2019-11-11 ENCOUNTER — Encounter: Payer: Self-pay | Admitting: Emergency Medicine

## 2019-11-11 DIAGNOSIS — I10 Essential (primary) hypertension: Secondary | ICD-10-CM

## 2019-11-11 DIAGNOSIS — Z79899 Other long term (current) drug therapy: Secondary | ICD-10-CM | POA: Diagnosis not present

## 2019-11-11 DIAGNOSIS — Z87891 Personal history of nicotine dependence: Secondary | ICD-10-CM | POA: Insufficient documentation

## 2019-11-11 DIAGNOSIS — R519 Headache, unspecified: Secondary | ICD-10-CM | POA: Diagnosis not present

## 2019-11-11 DIAGNOSIS — I629 Nontraumatic intracranial hemorrhage, unspecified: Secondary | ICD-10-CM | POA: Diagnosis not present

## 2019-11-11 LAB — BASIC METABOLIC PANEL
Anion gap: 14 (ref 5–15)
BUN: 13 mg/dL (ref 8–23)
CO2: 27 mmol/L (ref 22–32)
Calcium: 9.4 mg/dL (ref 8.9–10.3)
Chloride: 98 mmol/L (ref 98–111)
Creatinine, Ser: 0.69 mg/dL (ref 0.44–1.00)
GFR, Estimated: >60 mL/min (ref >60)
Glucose, Bld: 93 mg/dL (ref 70–99)
Potassium: 4.4 mmol/L (ref 3.5–5.1)
Sodium: 139 mmol/L (ref 135–145)

## 2019-11-11 LAB — CBC WITH DIFFERENTIAL/PLATELET
Abs Immature Granulocytes: 0.01 10*3/uL (ref 0.00–0.07)
Basophils Absolute: 0 10*3/uL (ref 0.0–0.1)
Basophils Relative: 1 %
Eosinophils Absolute: 0.2 10*3/uL (ref 0.0–0.5)
Eosinophils Relative: 3 %
HCT: 36.1 % (ref 36.0–46.0)
Hemoglobin: 11.8 g/dL — ABNORMAL LOW (ref 12.0–15.0)
Immature Granulocytes: 0 %
Lymphocytes Relative: 36 %
Lymphs Abs: 2 10*3/uL (ref 0.7–4.0)
MCH: 32.4 pg (ref 26.0–34.0)
MCHC: 32.7 g/dL (ref 30.0–36.0)
MCV: 99.2 fL (ref 80.0–100.0)
Monocytes Absolute: 0.5 10*3/uL (ref 0.1–1.0)
Monocytes Relative: 9 %
Neutro Abs: 2.8 10*3/uL (ref 1.7–7.7)
Neutrophils Relative %: 51 %
Platelets: 325 10*3/uL (ref 150–400)
RBC: 3.64 MIL/uL — ABNORMAL LOW (ref 3.87–5.11)
RDW: 12.5 % (ref 11.5–15.5)
WBC: 5.6 10*3/uL (ref 4.0–10.5)
nRBC: 0 % (ref 0.0–0.2)
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
## 2019-11-11 NOTE — ED Notes (Signed)
Straight stick, sent LG and LV to lab
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
## 2019-11-14 ENCOUNTER — Other Ambulatory Visit: Payer: Self-pay | Admitting: Family Medicine
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
## 2019-11-15 ENCOUNTER — Encounter: Payer: Self-pay | Admitting: Cardiology

## 2019-11-15 ENCOUNTER — Other Ambulatory Visit: Payer: Self-pay

## 2019-11-15 ENCOUNTER — Ambulatory Visit (INDEPENDENT_AMBULATORY_CARE_PROVIDER_SITE_OTHER): Payer: Medicare PPO

## 2019-11-15 ENCOUNTER — Ambulatory Visit: Payer: Medicare PPO | Admitting: Cardiology

## 2019-11-15 VITALS — Ht 65.0 in | Wt 110.8 lb

## 2019-11-15 DIAGNOSIS — R55 Syncope and collapse: Secondary | ICD-10-CM | POA: Diagnosis not present

## 2019-11-15 DIAGNOSIS — I10 Essential (primary) hypertension: Secondary | ICD-10-CM | POA: Diagnosis not present

## 2019-11-15 MED ORDER — AMLODIPINE BESYLATE 2.5 MG PO TABS: 2.5000 mg | ORAL_TABLET | Freq: Every day | ORAL | 5 refills | Status: DC
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
## 2019-11-15 NOTE — Patient Instructions (Signed)
Medication Instructions:  Your physician has recommended you make the following change in your medication:   START Amlodipine 5 mg daily. An Rx has been sent to your pharmacy.  *If you need a refill on your cardiac medications before your next appointment, please call your pharmacy*   Lab Work: None ordered If you have labs (blood work) drawn today and your tests are completely normal, you will receive your results only by: Marland Kitchen MyChart Message (if you have MyChart) OR . A paper copy in the mail If you have any lab test that is abnormal or we need to change your treatment, we will call you to review the results.   Testing/Procedures: Your physician has requested that you have an echocardiogram. Echocardiography is a painless test that uses sound waves to create images of your heart. It provides your doctor with information about the size and shape of your heart and how well your heart's chambers and valves are working. This procedure takes approximately one hour. There are no restrictions for this procedure.  Your physician has recommended that you wear a Zio monitor. (To be worn for 14 days). This monitor is a medical device that records the heart's electrical activity. Doctors most often use these monitors to diagnose arrhythmias. Arrhythmias are problems with the speed or rhythm of the heartbeat. The monitor is a small device applied to your chest. You can wear one while you do your normal daily activities. While wearing this monitor if you have any symptoms to push the button and record what you felt. Once you have worn this monitor for the period of time provider prescribed (Usually 14 days), you will return the monitor device in the postage paid box. Once it is returned they will download the data collected and provide [place] with a report which the provider will then review and we will call you with those results. Important tips:  1. Avoid showering during the first 24 hours of wearing the  monitor. 2. Avoid excessive sweating to help maximize wear time. 3. Do not submerge the device, no hot tubs, and no swimming pools. 4. Keep any lotions or oils away from the patch. 5. After 24 hours you may shower with the patch on. Take brief showers with your back facing the shower head.  6. Do not remove patch once it has been placed because that will interrupt data and decrease adhesive wear time. 7. Push the button when you have any symptoms and write down what you were feeling. 8. Once you have completed wearing your monitor, remove and place into box which has postage paid and place in your outgoing mailbox.  9. If for some reason you have misplaced your box then call our office and we can provide another box and/or mail it off for you.         Follow-Up: At [facility], you and your health needs are our priority.  As part of our continuing mission to provide you with exceptional heart care, we have created designated Provider Care Teams.  These Care Teams include your primary Cardiologist (physician) and Advanced Practice Providers (APPs -  Physician Assistants and Nurse Practitioners) who all work together to provide you with the care you need, when you need it.  We recommend signing up for the patient portal called "MyChart".  Sign up information is provided on this After Visit Summary.  MyChart is used to connect with patients for Virtual Visits (Telemedicine).  Patients are able to view lab/test results, encounter  notes, upcoming appointments, etc.  Non-urgent messages can be sent to your provider as well.   To learn more about what you can do with MyChart, go to [email or web address].    Your next appointment:   4-5 week(s)  The format for your next appointment:   In Person  Provider:   You may see Dr. Garen Lah or one of the following Advanced Practice Providers on your designated Care Team:    Murray Hodgkins, NP  Christell Faith, PA-C  Marrianne Mood,  PA-C  Cadence Kathlen Mody, [place]    Other Instructions N/A

## 2019-11-27 DIAGNOSIS — M48062 Spinal stenosis, lumbar region with neurogenic claudication: Secondary | ICD-10-CM | POA: Diagnosis not present

## 2019-11-27 DIAGNOSIS — M5136 Other intervertebral disc degeneration, lumbar region: Secondary | ICD-10-CM | POA: Diagnosis not present

## 2019-11-27 DIAGNOSIS — M5416 Radiculopathy, lumbar region: Secondary | ICD-10-CM | POA: Diagnosis not present

## 2019-12-02 DIAGNOSIS — R030 Elevated blood-pressure reading, without diagnosis of hypertension: Secondary | ICD-10-CM | POA: Insufficient documentation

## 2019-12-02 DIAGNOSIS — R778 Other specified abnormalities of plasma proteins: Secondary | ICD-10-CM | POA: Insufficient documentation

## 2019-12-02 DIAGNOSIS — M7918 Myalgia, other site: Secondary | ICD-10-CM | POA: Insufficient documentation

## 2019-12-02 DIAGNOSIS — R55 Syncope and collapse: Secondary | ICD-10-CM | POA: Insufficient documentation
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
## 2019-12-02 NOTE — Assessment & Plan Note (Signed)
Now back in normal range as pain is improving.

## 2019-12-02 NOTE — Assessment & Plan Note (Signed)
Tylenol prn and will improve with time. Gentle chest wall stretching.
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
## 2019-12-05 ENCOUNTER — Ambulatory Visit (INDEPENDENT_AMBULATORY_CARE_PROVIDER_SITE_OTHER): Payer: Medicare PPO

## 2019-12-05 ENCOUNTER — Other Ambulatory Visit: Payer: Self-pay

## 2019-12-05 DIAGNOSIS — R55 Syncope and collapse: Secondary | ICD-10-CM | POA: Diagnosis not present

## 2019-12-06 LAB — ECHOCARDIOGRAM COMPLETE
Area-P 1/2: 2.78 cm2
P 1/2 time: 903 msec
S' Lateral: 2.6 cm

## 2019-12-07 DIAGNOSIS — R55 Syncope and collapse: Secondary | ICD-10-CM | POA: Diagnosis not present

## 2019-12-09 ENCOUNTER — Other Ambulatory Visit: Payer: Self-pay | Admitting: Family Medicine

## 2019-12-12 ENCOUNTER — Telehealth: Payer: Self-pay

## 2019-12-12 NOTE — Telephone Encounter (Signed)
The patient has been notified of her Echo and Zio results via VM per DPR on file. Also sent to patient through [place].  Encouraged patient to call back with any questions or concerns.  Echo: Normal systolic function, grade 2 diastolic dysfunction, no findings to suggest etiology of syncope. Zio: Occasional paroxysmal SVTs, overall benign cardiac monitor with no significant arrhythmias to suggest etiology of syncope.

## 2019-12-18 ENCOUNTER — Telehealth: Payer: Self-pay | Admitting: *Deleted

## 2019-12-18 DIAGNOSIS — R410 Disorientation, unspecified: Secondary | ICD-10-CM | POA: Diagnosis not present

## 2019-12-18 DIAGNOSIS — R42 Dizziness and giddiness: Secondary | ICD-10-CM | POA: Diagnosis not present

## 2019-12-18 DIAGNOSIS — R531 Weakness: Secondary | ICD-10-CM | POA: Diagnosis not present

## 2019-12-18 NOTE — Telephone Encounter (Signed)
Form is completed and just needs signature.  Will have Dr. Lorelei Pont sign.

## 2019-12-18 NOTE — Telephone Encounter (Signed)
Jancie independent nurse with [facility] left a voicemail stating that they faxed over a FL2 form today to get completed so that they can admit her over to [facility] for therapy.Marland Kitchen

## 2019-12-18 NOTE — Telephone Encounter (Signed)
Can you complete and have someone sign if time sensitive?

## 2019-12-18 NOTE — Telephone Encounter (Signed)
Dr. Lorelei Pont signed FL-2 form and I faxed it back to [facility] at [phone].

## 2019-12-18 NOTE — Telephone Encounter (Signed)
Catherine Young is aware that form is to be completed and faxed back. Thanks

## 2019-12-18 NOTE — Telephone Encounter (Signed)
Leafy Ro is going to call [facility] and discuss this with the nurse.

## 2019-12-23 ENCOUNTER — Telehealth: Payer: Self-pay
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
## 2019-12-24 ENCOUNTER — Telehealth (INDEPENDENT_AMBULATORY_CARE_PROVIDER_SITE_OTHER): Payer: Medicare PPO | Admitting: Family Medicine

## 2019-12-24 ENCOUNTER — Other Ambulatory Visit: Payer: Self-pay

## 2019-12-24 DIAGNOSIS — R55 Syncope and collapse: Secondary | ICD-10-CM | POA: Diagnosis not present

## 2019-12-24 DIAGNOSIS — R197 Diarrhea, unspecified: Secondary | ICD-10-CM | POA: Diagnosis not present

## 2019-12-24 DIAGNOSIS — R413 Other amnesia: Secondary | ICD-10-CM

## 2019-12-24 DIAGNOSIS — F411 Generalized anxiety disorder: Secondary | ICD-10-CM

## 2019-12-24 DIAGNOSIS — N3000 Acute cystitis without hematuria: Secondary | ICD-10-CM | POA: Diagnosis not present

## 2019-12-24 DIAGNOSIS — K529 Noninfective gastroenteritis and colitis, unspecified: Secondary | ICD-10-CM | POA: Insufficient documentation

## 2019-12-24 MED ORDER — CLONAZEPAM 1 MG PO TABS: 0.5000 mg | ORAL_TABLET | Freq: Two times a day (BID) | ORAL | 0 refills | Status: DC | PRN

## 2019-12-24 NOTE — Assessment & Plan Note (Signed)
Likely linked to recent syncopal event... not clearly to first episode.  Now resolved.

## 2019-12-24 NOTE — Assessment & Plan Note (Signed)
Stable control.. refilled clonazepam to use prn.

## 2019-12-24 NOTE — Assessment & Plan Note (Addendum)
Negative cardiac work up. ? Related to dehydration, smoldering unrecognized UTI? No further episodes.  Agree with need for escalation of care to assisted living.

## 2019-12-24 NOTE — Assessment & Plan Note (Signed)
Now resolved... likely due to UTI.
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
## 2019-12-30 ENCOUNTER — Ambulatory Visit: Payer: Medicare PPO | Admitting: Cardiology

## 2019-12-30 ENCOUNTER — Other Ambulatory Visit: Payer: Self-pay

## 2019-12-30 ENCOUNTER — Encounter: Payer: Self-pay | Admitting: Cardiology

## 2019-12-30 VITALS — BP 144/72 | HR 64 | Ht 65.0 in | Wt 108.0 lb

## 2019-12-30 DIAGNOSIS — R55 Syncope and collapse: Secondary | ICD-10-CM

## 2019-12-30 DIAGNOSIS — I10 Essential (primary) hypertension: Secondary | ICD-10-CM | POA: Diagnosis not present
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
## 2019-12-30 NOTE — Patient Instructions (Signed)
Medication Instructions:  Your physician recommends that you continue on your current medications as directed. Please refer to the Current Medication list given to you today.  *If you need a refill on your cardiac medications before your next appointment, please call your pharmacy*   Lab Work: None Ordered If you have labs (blood work) drawn today and your tests are completely normal, you will receive your results only by: Marland Kitchen MyChart Message (if you have MyChart) OR . A paper copy in the mail If you have any lab test that is abnormal or we need to change your treatment, we will call you to review the results.   Testing/Procedures: None Ordered   Follow-Up: At [facility], you and your health needs are our priority.  As part of our continuing mission to provide you with exceptional heart care, we have created designated Provider Care Teams.  These Care Teams include your primary Cardiologist (physician) and Advanced Practice Providers (APPs -  Physician Assistants and Nurse Practitioners) who all work together to provide you with the care you need, when you need it.  We recommend signing up for the patient portal called "MyChart".  Sign up information is provided on this After Visit Summary.  MyChart is used to connect with patients for Virtual Visits (Telemedicine).  Patients are able to view lab/test results, encounter notes, upcoming appointments, etc.  Non-urgent messages can be sent to your provider as well.   To learn more about what you can do with MyChart, go to [email or web address].    Your next appointment:   6-12 month(s)  The format for your next appointment:   In Person  Provider:   Kate Sable, MD   Other Instructions

## 2020-03-03 ENCOUNTER — Other Ambulatory Visit: Payer: Self-pay | Admitting: Family Medicine

## 2020-03-03 NOTE — Telephone Encounter (Signed)
Please schedule MWV with nurse and CPE with Dr. Bedsole. 

## 2020-03-06 NOTE — Telephone Encounter (Signed)
Spoke with patient schedule MWV with nurse and CPE 

## 2020-03-16 ENCOUNTER — Telehealth: Payer: Self-pay | Admitting: Family Medicine

## 2020-03-16 DIAGNOSIS — Z1321 Encounter for screening for nutritional disorder: Secondary | ICD-10-CM

## 2020-03-16 DIAGNOSIS — R030 Elevated blood-pressure reading, without diagnosis of hypertension: Secondary | ICD-10-CM

## 2020-03-16 DIAGNOSIS — Z130 Encounter for screening for diseases of the blood and blood-forming organs and certain disorders involving the immune mechanism: Secondary | ICD-10-CM

## 2020-03-16 NOTE — Telephone Encounter (Signed)
-----   Message from Cloyd Stagers, RT sent at 03/09/2020  2:01 PM EST ----- Regarding: Lab Order for Tuesday 3.1.2022 Please place lab orders for Tuesday 3.1.2022, office visit for physical on Friday 3.18.2022 Thank you, Dyke Maes RT(R)

## 2020-03-24 ENCOUNTER — Other Ambulatory Visit: Payer: Self-pay

## 2020-03-24 ENCOUNTER — Other Ambulatory Visit (INDEPENDENT_AMBULATORY_CARE_PROVIDER_SITE_OTHER): Payer: Medicare PPO

## 2020-03-24 DIAGNOSIS — R8290 Unspecified abnormal findings in urine: Secondary | ICD-10-CM | POA: Diagnosis not present

## 2020-03-24 DIAGNOSIS — R030 Elevated blood-pressure reading, without diagnosis of hypertension: Secondary | ICD-10-CM

## 2020-03-24 DIAGNOSIS — R3915 Urgency of urination: Secondary | ICD-10-CM

## 2020-03-24 DIAGNOSIS — Z1321 Encounter for screening for nutritional disorder: Secondary | ICD-10-CM | POA: Diagnosis not present

## 2020-03-24 DIAGNOSIS — R300 Dysuria: Secondary | ICD-10-CM

## 2020-03-24 DIAGNOSIS — Z130 Encounter for screening for diseases of the blood and blood-forming organs and certain disorders involving the immune mechanism: Secondary | ICD-10-CM

## 2020-03-24 LAB — CBC WITH DIFFERENTIAL/PLATELET
Basophils Absolute: 0 10*3/uL (ref 0.0–0.1)
Basophils Relative: 0.4 % (ref 0.0–3.0)
Eosinophils Absolute: 0.1 10*3/uL (ref 0.0–0.7)
Eosinophils Relative: 2.1 % (ref 0.0–5.0)
HCT: 37.2 % (ref 36.0–46.0)
Hemoglobin: 12.5 g/dL (ref 12.0–15.0)
Lymphocytes Relative: 30.1 % (ref 12.0–46.0)
Lymphs Abs: 1.5 10*3/uL (ref 0.7–4.0)
MCHC: 33.7 g/dL (ref 30.0–36.0)
MCV: 96.4 fl (ref 78.0–100.0)
Monocytes Absolute: 0.4 10*3/uL (ref 0.1–1.0)
Monocytes Relative: 9 % (ref 3.0–12.0)
Neutro Abs: 2.8 10*3/uL (ref 1.4–7.7)
Neutrophils Relative %: 58.4 % (ref 43.0–77.0)
Platelets: 233 10*3/uL (ref 150.0–400.0)
RBC: 3.86 Mil/uL — ABNORMAL LOW (ref 3.87–5.11)
RDW: 13.2 % (ref 11.5–15.5)
WBC: 4.9 10*3/uL (ref 4.0–10.5)

## 2020-03-24 LAB — URINALYSIS, ROUTINE W REFLEX MICROSCOPIC
Bilirubin Urine: NEGATIVE
Ketones, ur: NEGATIVE
Nitrite: POSITIVE — AB
Specific Gravity, Urine: 1.01 (ref 1.000–1.030)
Total Protein, Urine: NEGATIVE
Urine Glucose: NEGATIVE
Urobilinogen, UA: 0.2 (ref 0.0–1.0)
pH: 6 (ref 5.0–8.0)

## 2020-03-24 LAB — COMPREHENSIVE METABOLIC PANEL
ALT: 11 U/L (ref 0–35)
AST: 22 U/L (ref 0–37)
Albumin: 4.2 g/dL (ref 3.5–5.2)
Alkaline Phosphatase: 42 U/L (ref 39–117)
BUN: 17 mg/dL (ref 6–23)
CO2: 31 mEq/L (ref 19–32)
Calcium: 9.9 mg/dL (ref 8.4–10.5)
Chloride: 98 mEq/L (ref 96–112)
Creatinine, Ser: 1.03 mg/dL (ref 0.40–1.20)
GFR: 49.26 mL/min — ABNORMAL LOW (ref >60.00)
Glucose, Bld: 94 mg/dL (ref 70–99)
Potassium: 4.2 mEq/L (ref 3.5–5.1)
Sodium: 136 mEq/L (ref 135–145)
Total Bilirubin: 0.6 mg/dL (ref 0.2–1.2)
Total Protein: 7.5 g/dL (ref 6.0–8.3)

## 2020-03-24 LAB — VITAMIN D 25 HYDROXY (VIT D DEFICIENCY, FRACTURES): VITD: 58.72 ng/mL (ref 30.00–100.00)

## 2020-03-24 NOTE — Addendum Note (Signed)
Addended by: Cloyd Stagers on: 03/24/2020 01:21 PM   Modules accepted: Orders

## 2020-03-26 ENCOUNTER — Other Ambulatory Visit: Payer: Self-pay | Admitting: Family Medicine

## 2020-03-26 LAB — URINE CULTURE
MICRO NUMBER:: 11592605
SPECIMEN QUALITY:: ADEQUATE

## 2020-03-26 MED ORDER — CEPHALEXIN 500 MG PO CAPS: 500.0000 mg | ORAL_CAPSULE | Freq: Three times a day (TID) | ORAL | 0 refills | Status: DC

## 2020-04-01 ENCOUNTER — Other Ambulatory Visit: Payer: Self-pay

## 2020-04-01 ENCOUNTER — Ambulatory Visit (INDEPENDENT_AMBULATORY_CARE_PROVIDER_SITE_OTHER): Payer: Medicare PPO

## 2020-04-01 DIAGNOSIS — Z0000 Encounter for general adult medical examination without abnormal findings: Secondary | ICD-10-CM | POA: Diagnosis not present

## 2020-04-01 NOTE — Progress Notes (Signed)
PCP notes:  Health Maintenance: No gaps noted  Abnormal Screenings: None   Patient concerns: Memory loss   Nurse concerns: none   Next PCP appt.: 04/10/2020 @ 11:40 am

## 2020-04-01 NOTE — Progress Notes (Signed)
Subjective:   Catherine Young is a 87 y.o. female who presents for Medicare Annual (Subsequent) preventive examination.  Review of Systems: N/A      I connected with the patient today by telephone and verified that I am speaking with the correct person using two identifiers. Location patient: home Location nurse: work Persons participating in the telephone visit: patient, nurse.   I discussed the limitations, risks, security and privacy concerns of performing an evaluation and management service by telephone and the availability of in person appointments. I also discussed with the patient that there may be a patient responsible charge related to this service. The patient expressed understanding and verbally consented to this telephonic visit.        Cardiac Risk Factors include: advanced age (>79men, >19 women)     Objective:    Today's Vitals   There is no height or weight on file to calculate BMI.  Advanced Directives 04/01/2020 11/11/2019 10/21/2019 12/18/2018 12/13/2017 05/25/2017 11/22/2016  Does Patient Have a Medical Advance Directive? Yes No No Yes Yes Yes Yes  Type of Paramedic of [place];Living will - - [place];Living will [place];Living will [place];Living will [place];Living will  Does patient want to make changes to medical advance directive? - - - - - No - Patient declined -  Copy of [place] in Chart? No - copy requested - - No - copy requested No - copy requested No - copy requested No - copy requested  Would patient like information on creating a medical advance directive? - No - Patient declined - - - - -    Current Medications (verified) Outpatient Encounter Medications as of 04/01/2020  Medication Sig  . cephALEXin (KEFLEX) 500 MG capsule Take 1 capsule (500 mg total) by mouth 3 (three) times daily.  . Cholecalciferol (VITAMIN D3) 50  MCG (2000 UT) capsule Take 2,000 Units by mouth daily.  . clonazePAM (KLONOPIN) 1 MG tablet Take 0.5 tablets (0.5 mg total) by mouth 2 (two) times daily as needed for anxiety.  . Cyanocobalamin (B-12 PO) Take by mouth.  . sertraline (ZOLOFT) 100 MG tablet TAKE 1 TABLET(100 MG) BY MOUTH AT BEDTIME  . traZODone (DESYREL) 100 MG tablet TAKE 1 TABLET(100 MG) BY MOUTH AT BEDTIME AS NEEDED FOR SLEEP  . amLODipine (NORVASC) 2.5 MG tablet Take 1 tablet (2.5 mg total) by mouth daily.   No facility-administered encounter medications on file as of 04/01/2020.    Allergies (verified) Patient has no known allergies.   History: Past Medical History:  Diagnosis Date  . Arthritis   . Basal cell carcinoma of tragus, left   . Cataract    right  . Complication of anesthesia    COULD'T MOVE WHEN WAKING UP AFTER [place] COLON SURGERY  . Depression   . Hypertension   . Intestinal obstruction ([place])   . Osteoporosis    Past Surgical History:  Procedure Laterality Date  . ABDOMINAL HYSTERECTOMY     partial, for menorrhagia  . APPENDECTOMY    . CATARACT EXTRACTION W/PHACO Right 05/25/2017   Procedure: CATARACT EXTRACTION PHACO AND INTRAOCULAR LENS PLACEMENT (IOC);  Surgeon: Eulogio Bear, MD;  Location: [facility] ORS;  Service: Ophthalmology;  Laterality: Right;  fluid pack lot #  [id number]  exp  12/24/2018 [place]   00:39.4 AP%   9.4 CDE    3.71   . COLON SURGERY     INTESTINAL  BLOCKAGE FROM SCAR TISSUE X 2   Family History  Problem Relation Age of Onset  . Arthritis Mother   . Colon cancer Mother   . Cancer Mother        colon  . Diabetes Father   . Arthritis Sister   . Colon cancer Sister   . Cancer Sister        colon  . Lung cancer Brother   . Cancer Brother        lung  . Arthritis Sister   . Colon cancer Sister   . Cancer Sister        colon   Social History   Socioeconomic History  . Marital status: Widowed    Spouse name: Not on file  . Number of children: Not on file  . Years  of education: Not on file  . Highest education level: Not on file  Occupational History  . Not on file  Tobacco Use  . Smoking status: Former Smoker    Quit date: 01/24/1993    Years since quitting: 27.2  . Smokeless tobacco: Never Used  . Tobacco comment: quit 1995  Vaping Use  . Vaping Use: Never used  Substance and Sexual Activity  . Alcohol use: Yes    Alcohol/week: 7.0 standard drinks    Types: 7 Glasses of wine per week    Comment: nightly glass of wine  . Drug use: No  . Sexual activity: Never  Other Topics Concern  . Not on file  Social History Narrative  . Not on file   Social Determinants of Health   Financial Resource Strain: Low Risk   . Difficulty of Paying Living Expenses: Not hard at all  Food Insecurity: No Food Insecurity  . Worried About Charity fundraiser in the Last Year: Never true  . Ran Out of Food in the Last Year: Never true  Transportation Needs: No Transportation Needs  . Lack of Transportation (Medical): No  . Lack of Transportation (Non-Medical): No  Physical Activity: Inactive  . Days of Exercise per Week: 0 days  . Minutes of Exercise per Session: 0 min  Stress: No Stress Concern Present  . Feeling of Stress : Not at all  Social Connections: Not on file    Tobacco Counseling Counseling given: Not Answered Comment: quit 1995   Clinical Intake:  Pre-visit preparation completed: Yes  Pain : 0-10 Pain Type: Chronic pain Pain Location: Back Pain Descriptors / Indicators: Aching Pain Onset: More than a month ago Pain Frequency: Intermittent     Nutritional Risks: None Diabetes: No  How often do you need to have someone help you when you read instructions, pamphlets, or other written materials from your doctor or pharmacy?: 1 - Never What is the last grade level you completed in school?: 12th  Diabetic: No Nutrition Risk Assessment:  Has the patient had any N/V/D within the last 2 months?  No  Does the patient have any  non-healing wounds?  No  Has the patient had any unintentional weight loss or weight gain?  No   Diabetes:  Is the patient diabetic?  No  If diabetic, was a CBG obtained today?  N/A Did the patient bring in their glucometer from home?  N/A How often do you monitor your CBG's? N/A.   Financial Strains and Diabetes Management:  Are you having any financial strains with the device, your supplies or your medication? N/A.  Does the patient want to be seen by Chronic  Care Management for management of their diabetes?  N/A Would the patient like to be referred to a Nutritionist or for Diabetic Management?  N/A    Interpreter Needed?: No  Information entered by :: CJohnson, LPN   Activities of Daily Living In your present state of health, do you have any difficulty performing the following activities: 04/01/2020  Hearing? N  Vision? N  Difficulty concentrating or making decisions? Y  Comment Patient has memory loss  Walking or climbing stairs? N  Dressing or bathing? N  Doing errands, shopping? N  Preparing Food and eating ? N  Using the Toilet? N  In the past six months, have you accidently leaked urine? Y  Comment wears depends  Do you have problems with loss of bowel control? N  Managing your Medications? N  Managing your Finances? N  Housekeeping or managing your Housekeeping? N  Some recent data might be hidden    Patient Care Team: Jinny Sanders, MD as PCP - General (Family Medicine) Eulogio Bear, MD as Consulting Physician (Ophthalmology)  Indicate any recent Medical Services you may have received from other than [facility] providers in the past year (date may be approximate).     Assessment:   This is a routine wellness examination for [facility].  Hearing/Vision screen  Hearing Screening   125Hz  250Hz  500Hz  1000Hz  2000Hz  3000Hz  4000Hz  6000Hz  8000Hz   Right ear:           Left ear:           Vision Screening Comments: Patient gets annual eye exams   Dietary  issues and exercise activities discussed: Current Exercise Habits: The patient does not participate in regular exercise at present, Exercise limited by: None identified  Goals    . Health Management     Starting 12/13/2017, I will continue to do water aerobics for 60 min 1 day per week and to drink at least 32 oz of water daily.     . Patient Stated     12/18/2018, I will maintain and continue medications as prescribed.     . Patient Stated     04/01/2020, I will maintain and continue medications as prescribed.       Depression Screen PHQ 2/9 Scores 04/01/2020 04/30/2019 01/24/2019 12/18/2018 07/31/2018 01/16/2018 12/13/2017  PHQ - 2 Score 0 2 4 6 4 4 4   PHQ- 9 Score 0 13 11 9 9 9 12     Fall Risk Fall Risk  04/01/2020 12/18/2018 12/13/2017 11/22/2016 07/14/2015  Falls in the past year? 1 0 0 No No  Number falls in past yr: 1 0 - - -  Injury with Fall? 0 0 - - -  Risk for fall due to : Medication side effect;Impaired balance/gait;Impaired mobility;History of fall(s) Impaired balance/gait;Impaired mobility;Medication side effect - - -  Follow up Falls evaluation completed;Falls prevention discussed Falls evaluation completed;Falls prevention discussed - - -    FALL RISK PREVENTION PERTAINING TO THE HOME:  Any stairs in or around the home? Yes  If so, are there any without handrails? No  Home free of loose throw rugs in walkways, pet beds, electrical cords, etc? Yes  Adequate lighting in your home to reduce risk of falls? Yes   ASSISTIVE DEVICES UTILIZED TO PREVENT FALLS:  Life alert? No  Use of a cane, walker or w/c? Yes  Grab bars in the bathroom? Yes  Shower chair or bench in shower? Yes  Elevated toilet seat or a handicapped toilet? Yes  TIMED UP AND GO:  Was the test performed? N/A telephone visit .    Cognitive Function: MMSE - Mini Mental State Exam 04/01/2020 12/18/2018 12/13/2017 11/22/2016  Orientation to time 5 5 5 5   Orientation to Place 5 5 5 5   Registration 3 3 3 3    Attention/ Calculation 5 5 0 0  Recall 3 3 3 3   Language- name 2 objects - - 0 0  Language- repeat 1 1 1 1   Language- follow 3 step command - - 3 2  Language- follow 3 step command-comments - - - unable to follow 1 step of 3 step command  Language- read & follow direction - - 0 0  Write a sentence - - 0 0  Copy design - - 0 0  Total score - - 20 19  Mini Cog  Mini-Cog screen was completed. Maximum score is 22. A value of 0 denotes this part of the MMSE was not completed or the patient failed this part of the Mini-Cog screening.       Immunizations Immunization History  Administered Date(s) Administered  . Fluad Quad(high Dose 65+) 10/23/2018, 10/25/2019  . Influenza,inj,quad, With Preservative 11/10/2016  . Influenza-Unspecified 11/17/2016, 10/24/2017  . Moderna Sars-Covid-2 Vaccination 02/08/2019, 05/14/2019, 11/13/2019  . Pneumococcal Conjugate-13 10/25/2011, 11/22/2016  . Pneumococcal Polysaccharide-23 12/13/2017    TDAP status: Due, Education has been provided regarding the importance of this vaccine. Advised may receive this vaccine at local pharmacy or Health Dept. Aware to provide a copy of the vaccination record if obtained from local pharmacy or Health Dept. Verbalized acceptance and understanding.  Flu Vaccine status: Up to date  Pneumococcal vaccine status: Up to date  Covid-19 vaccine status: Completed vaccines  Qualifies for Shingles Vaccine? Yes   Zostavax completed No   Shingrix Completed?: No.    Education has been provided regarding the importance of this vaccine. Patient has been advised to call insurance company to determine out of pocket expense if they have not yet received this vaccine. Advised may also receive vaccine at local pharmacy or Health Dept. Verbalized acceptance and understanding.  Screening Tests Health Maintenance  Topic Date Due  . TETANUS/TDAP  11/23/2026 (Originally 10/17/1950)  . INFLUENZA VACCINE  Completed  . DEXA SCAN  Completed   . COVID-19 Vaccine  Completed  . PNA vac Low Risk Adult  Completed  . HPV VACCINES  Aged Out    Health Maintenance  There are no preventive care reminders to display for this patient.  Colorectal cancer screening: No longer required.   Mammogram status: No longer required due to age.  Bone Density status: declined  Lung Cancer Screening: (Low Dose CT Chest recommended if Age 67-80 years, 30 pack-year currently smoking OR have quit w/in 15years.) does not qualify.    Additional Screening:  Hepatitis C Screening: does not qualify; Completed N/A  Vision Screening: Recommended annual ophthalmology exams for early detection of glaucoma and other disorders of the eye. Is the patient up to date with their annual eye exam?  Yes  Who is the provider or what is the name of the office in which the patient attends annual eye exams? [facility] [place] If pt is not established with a provider, would they like to be referred to a provider to establish care? No .   Dental Screening: Recommended annual dental exams for proper oral hygiene  Community Resource Referral / Chronic Care Management: CRR required this visit?  No   CCM required this visit?  No  Plan:     I have personally reviewed and noted the following in the patient's chart:   . Medical and social history . Use of alcohol, tobacco or illicit drugs  . Current medications and supplements . Functional ability and status . Nutritional status . Physical activity . Advanced directives . List of other physicians . Hospitalizations, surgeries, and ER visits in previous 12 months . Vitals . Screenings to include cognitive, depression, and falls . Referrals and appointments  In addition, I have reviewed and discussed with patient certain preventive protocols, quality metrics, and best practice recommendations. A written personalized care plan for preventive services as well as general preventive health recommendations  were provided to patient.   Due to this being a telephonic visit, the after visit summary with patients personalized plan was offered to patient via office or my-chart. Patient preferred to pick up at office at next visit or via mychart.   Andrez Grime, LPN   [id number]

## 2020-04-01 NOTE — Patient Instructions (Signed)
Catherine Young , Thank you for taking time to come for your Medicare Wellness Visit. I appreciate your ongoing commitment to your health goals. Please review the following plan we discussed and let me know if I can assist you in the future.   Screening recommendations/referrals: Colonoscopy: no longer required  Mammogram: no longer required  Bone Density: declined Recommended yearly ophthalmology/optometry visit for glaucoma screening and checkup Recommended yearly dental visit for hygiene and checkup  Vaccinations: Influenza vaccine: Up to date, completed 10/25/2019, due 08/2020 Pneumococcal vaccine: Completed series Tdap vaccine: decline-insurance Shingles vaccine: due, check with your insurance regarding coverage if interested    Covid-19:Completed series  Advanced directives: Please bring a copy of your POA (Power of Attorney) and/or Living Will to your next appointment.   Conditions/risks identified: none  Next appointment: Follow up in one year for your annual wellness visit    Preventive Care 65 Years and Older, Female Preventive care refers to lifestyle choices and visits with your health care provider that can promote health and wellness. What does preventive care include?  A yearly physical exam. This is also called an annual well check.  Dental exams once or twice a year.  Routine eye exams. Ask your health care provider how often you should have your eyes checked.  Personal lifestyle choices, including:  Daily care of your teeth and gums.  Regular physical activity.  Eating a healthy diet.  Avoiding tobacco and drug use.  Limiting alcohol use.  Practicing safe sex.  Taking low-dose aspirin every day.  Taking vitamin and mineral supplements as recommended by your health care provider. What happens during an annual well check? The services and screenings done by your health care provider during your annual well check will depend on your age, overall health,  lifestyle risk factors, and family history of disease. Counseling  Your health care provider may ask you questions about your:  Alcohol use.  Tobacco use.  Drug use.  Emotional well-being.  Home and relationship well-being.  Sexual activity.  Eating habits.  History of falls.  Memory and ability to understand (cognition).  Work and work Statistician.  Reproductive health. Screening  You may have the following tests or measurements:  Height, weight, and BMI.  Blood pressure.  Lipid and cholesterol levels. These may be checked every 5 years, or more frequently if you are over 85 years old.  Skin check.  Lung cancer screening. You may have this screening every year starting at age 85 if you have a 30-pack-year history of smoking and currently smoke or have quit within the past 15 years.  Fecal occult blood test (FOBT) of the stool. You may have this test every year starting at age 85.  Flexible sigmoidoscopy or colonoscopy. You may have a sigmoidoscopy every 5 years or a colonoscopy every 10 years starting at age 85.  Hepatitis C blood test.  Hepatitis B blood test.  Sexually transmitted disease (STD) testing.  Diabetes screening. This is done by checking your blood sugar (glucose) after you have not eaten for a while (fasting). You may have this done every 1-3 years.  Bone density scan. This is done to screen for osteoporosis. You may have this done starting at age 85.  Mammogram. This may be done every 1-2 years. Talk to your health care provider about how often you should have regular mammograms. Talk with your health care provider about your test results, treatment options, and if necessary, the need for more tests. Vaccines  Your health  care provider may recommend certain vaccines, such as:  Influenza vaccine. This is recommended every year.  Tetanus, diphtheria, and acellular pertussis (Tdap, Td) vaccine. You may need a Td booster every 10 years.  Zoster  vaccine. You may need this after age 85.  Pneumococcal 13-valent conjugate (PCV13) vaccine. One dose is recommended after age 85.  Pneumococcal polysaccharide (PPSV23) vaccine. One dose is recommended after age 85. Talk to your health care provider about which screenings and vaccines you need and how often you need them. This information is not intended to replace advice given to you by your health care provider. Make sure you discuss any questions you have with your health care provider. Document Released: 02/06/2015 Document Revised: 09/30/2015 Document Reviewed: 11/11/2014 Elsevier Interactive Patient Education  2017 [place] Prevention in the Home Falls can cause injuries. They can happen to people of all ages. There are many things you can do to make your home safe and to help prevent falls. What can I do on the outside of my home?  Regularly fix the edges of walkways and driveways and fix any cracks.  Remove anything that might make you trip as you walk through a door, such as a raised step or threshold.  Trim any bushes or trees on the path to your home.  Use bright outdoor lighting.  Clear any walking paths of anything that might make someone trip, such as rocks or tools.  Regularly check to see if handrails are loose or broken. Make sure that both sides of any steps have handrails.  Any raised decks and porches should have guardrails on the edges.  Have any leaves, snow, or ice cleared regularly.  Use sand or salt on walking paths during winter.  Clean up any spills in your garage right away. This includes oil or grease spills. What can I do in the bathroom?  Use night lights.  Install grab bars by the toilet and in the tub and shower. Do not use towel bars as grab bars.  Use non-skid mats or decals in the tub or shower.  If you need to sit down in the shower, use a plastic, non-slip stool.  Keep the floor dry. Clean up any water that spills on the  floor as soon as it happens.  Remove soap buildup in the tub or shower regularly.  Attach bath mats securely with double-sided non-slip rug tape.  Do not have throw rugs and other things on the floor that can make you trip. What can I do in the bedroom?  Use night lights.  Make sure that you have a light by your bed that is easy to reach.  Do not use any sheets or blankets that are too big for your bed. They should not hang down onto the floor.  Have a firm chair that has side arms. You can use this for support while you get dressed.  Do not have throw rugs and other things on the floor that can make you trip. What can I do in the kitchen?  Clean up any spills right away.  Avoid walking on wet floors.  Keep items that you use a lot in easy-to-reach places.  If you need to reach something above you, use a strong step stool that has a grab bar.  Keep electrical cords out of the way.  Do not use floor polish or wax that makes floors slippery. If you must use wax, use non-skid floor wax.  Do not have  throw rugs and other things on the floor that can make you trip. What can I do with my stairs?  Do not leave any items on the stairs.  Make sure that there are handrails on both sides of the stairs and use them. Fix handrails that are broken or loose. Make sure that handrails are as long as the stairways.  Check any carpeting to make sure that it is firmly attached to the stairs. Fix any carpet that is loose or worn.  Avoid having throw rugs at the top or bottom of the stairs. If you do have throw rugs, attach them to the floor with carpet tape.  Make sure that you have a light switch at the top of the stairs and the bottom of the stairs. If you do not have them, ask someone to add them for you. What else can I do to help prevent falls?  Wear shoes that:  Do not have high heels.  Have rubber bottoms.  Are comfortable and fit you well.  Are closed at the toe. Do not wear  sandals.  If you use a stepladder:  Make sure that it is fully opened. Do not climb a closed stepladder.  Make sure that both sides of the stepladder are locked into place.  Ask someone to hold it for you, if possible.  Clearly mark and make sure that you can see:  Any grab bars or handrails.  First and last steps.  Where the edge of each step is.  Use tools that help you move around (mobility aids) if they are needed. These include:  Canes.  Walkers.  Scooters.  Crutches.  Turn on the lights when you go into a dark area. Replace any light bulbs as soon as they burn out.  Set up your furniture so you have a clear path. Avoid moving your furniture around.  If any of your floors are uneven, fix them.  If there are any pets around you, be aware of where they are.  Review your medicines with your doctor. Some medicines can make you feel dizzy. This can increase your chance of falling. Ask your doctor what other things that you can do to help prevent falls. This information is not intended to replace advice given to you by your health care provider. Make sure you discuss any questions you have with your health care provider. Document Released: 11/06/2008 Document Revised: 06/18/2015 Document Reviewed: 02/14/2014 Elsevier Interactive Patient Education  2017 [employer].

## 2020-04-03 ENCOUNTER — Other Ambulatory Visit: Payer: Medicare PPO

## 2020-04-10 ENCOUNTER — Other Ambulatory Visit: Payer: Self-pay

## 2020-04-10 ENCOUNTER — Encounter: Payer: Self-pay | Admitting: Family Medicine

## 2020-04-10 ENCOUNTER — Ambulatory Visit: Payer: Medicare PPO | Admitting: Family Medicine

## 2020-04-10 VITALS — BP 170/71 | HR 62 | Temp 98.3°F | Ht 65.0 in | Wt 107.2 lb

## 2020-04-10 DIAGNOSIS — R413 Other amnesia: Secondary | ICD-10-CM

## 2020-04-10 DIAGNOSIS — F411 Generalized anxiety disorder: Secondary | ICD-10-CM

## 2020-04-10 DIAGNOSIS — Z0000 Encounter for general adult medical examination without abnormal findings: Secondary | ICD-10-CM | POA: Diagnosis not present

## 2020-04-10 DIAGNOSIS — N39498 Other specified urinary incontinence: Secondary | ICD-10-CM

## 2020-04-10 DIAGNOSIS — R197 Diarrhea, unspecified: Secondary | ICD-10-CM

## 2020-04-10 DIAGNOSIS — F331 Major depressive disorder, recurrent, moderate: Secondary | ICD-10-CM | POA: Diagnosis not present

## 2020-04-10 LAB — CBC WITH DIFFERENTIAL/PLATELET
Basophils Absolute: 0 10*3/uL (ref 0.0–0.1)
Basophils Relative: 0.4 % (ref 0.0–3.0)
Eosinophils Absolute: 0.1 10*3/uL (ref 0.0–0.7)
Eosinophils Relative: 1.4 % (ref 0.0–5.0)
HCT: 37.9 % (ref 36.0–46.0)
Hemoglobin: 12.6 g/dL (ref 12.0–15.0)
Lymphocytes Relative: 27.9 % (ref 12.0–46.0)
Lymphs Abs: 1.4 10*3/uL (ref 0.7–4.0)
MCHC: 33.3 g/dL (ref 30.0–36.0)
MCV: 95.9 fl (ref 78.0–100.0)
Monocytes Absolute: 0.4 10*3/uL (ref 0.1–1.0)
Monocytes Relative: 8.5 % (ref 3.0–12.0)
Neutro Abs: 3.2 10*3/uL (ref 1.4–7.7)
Neutrophils Relative %: 61.8 % (ref 43.0–77.0)
Platelets: 219 10*3/uL (ref 150.0–400.0)
RBC: 3.96 Mil/uL (ref 3.87–5.11)
RDW: 13 % (ref 11.5–15.5)
WBC: 5.1 10*3/uL (ref 4.0–10.5)

## 2020-04-10 LAB — POC URINALSYSI DIPSTICK (AUTOMATED)
Bilirubin, UA: NEGATIVE
Glucose, UA: NEGATIVE
Ketones, UA: NEGATIVE
Leukocytes, UA: NEGATIVE
Nitrite, UA: NEGATIVE
Protein, UA: POSITIVE — AB
Spec Grav, UA: 1.015 (ref 1.010–1.025)
Urobilinogen, UA: 0.2 E.U./dL
pH, UA: 6 (ref 5.0–8.0)

## 2020-04-10 LAB — T4, FREE: Free T4: 0.78 ng/dL (ref 0.60–1.60)

## 2020-04-10 LAB — VITAMIN D 25 HYDROXY (VIT D DEFICIENCY, FRACTURES): VITD: 60.47 ng/mL (ref 30.00–100.00)

## 2020-04-10 LAB — VITAMIN B12: Vitamin B-12: 346 pg/mL (ref 211–911)

## 2020-04-10 LAB — TSH: TSH: 1.43 u[IU]/mL (ref 0.35–4.50)

## 2020-04-10 LAB — T3, FREE: T3, Free: 3.4 pg/mL (ref 2.3–4.2)

## 2020-04-10 NOTE — Patient Instructions (Addendum)
Start metamucil daily to help with stool issue.  Start probiotic like Culturelle or Align daily.  Please stop at the lab to have labs drawn.

## 2020-04-10 NOTE — Progress Notes (Signed)
Patient ID: Catherine Young, female    DOB: 02/21/1931, 87 y.o.   MRN: [id number]  This visit was conducted in person.  BP (!) 170/71   Pulse 62   Temp 98.3 F (36.8 C) (Temporal)   Ht 5\' 5"  (1.651 m)   Wt 107 lb 4 oz (48.6 kg)   SpO2 93%   BMI 17.85 kg/m    CC:  Chief Complaint  Patient presents with  . Annual Exam    Part 2    Subjective:   HPI: Catherine Young is a 87 y.o. female presenting on 04/10/2020 for Annual Exam (Part 2)  The patient saw a LPN or RN for medicare wellness visit.  Prevention and wellness was reviewed in detail. Note reviewed and important notes copied below. Health Maintenance: No gaps noted  Abnormal Screenings: None  Patient concerns:  Memory loss.. gradually worsening in last year. Forgetting where she puts stuff, short term memory.  No issue with remembering pills.  Sister with Alzheimers   GAD,  MDD Continued issues with Anxiety.. some issues sleeping at night. 4-5 hours a night.   He continues to worry about everything  On sertraline 100 mg daily and trazodone 100 mg  Using klonopin 1/2 tab twice daily Flowsheet Row Clinical Support from 04/01/2020 in [place] at [facility] Total Score 0       She has had years of issues with bowels... worsening in last few months, she is scared to go out.  Loose watery stools, has to wear pads. episodes of diarrhea come and go but in between she has stool leaking  Without realizing it. Low abd pain, cramping prior to BM.  She does not eat much.. snacking.  Colonoscopy unremarkable except for tic in 2012.  Treated for Ecoli in early march... burning resolved.  Still having some sharp pain at urinary opening after urination.    Wt Readings from Last 3 Encounters:  04/10/20 107 lb 4 oz (48.6 kg)  12/30/19 108 lb (49 kg)  11/15/19 110 lb 12.8 oz (50.3 kg)     Relevant past medical, surgical, family and social history reviewed and updated as indicated. Interim  medical history since our last visit reviewed. Allergies and medications reviewed and updated. Outpatient Medications Prior to Visit  Medication Sig Dispense Refill  . Cholecalciferol (VITAMIN D3) 50 MCG (2000 UT) capsule Take 2,000 Units by mouth daily.    . clonazePAM (KLONOPIN) 1 MG tablet Take 0.5 tablets (0.5 mg total) by mouth 2 (two) times daily as needed for anxiety. 90 tablet 0  . Cyanocobalamin (B-12 PO) Take by mouth.    . sertraline (ZOLOFT) 100 MG tablet TAKE 1 TABLET(100 MG) BY MOUTH AT BEDTIME 90 tablet 0  . traZODone (DESYREL) 100 MG tablet TAKE 1 TABLET(100 MG) BY MOUTH AT BEDTIME AS NEEDED FOR SLEEP 90 tablet 0  . amLODipine (NORVASC) 2.5 MG tablet Take 1 tablet (2.5 mg total) by mouth daily. 30 tablet 5  . cephALEXin (KEFLEX) 500 MG capsule Take 1 capsule (500 mg total) by mouth 3 (three) times daily. 21 capsule 0   No facility-administered medications prior to visit.     Per HPI unless specifically indicated in ROS section below Review of Systems Objective:  BP (!) 170/71   Pulse 62   Temp 98.3 F (36.8 C) (Temporal)   Ht 5\' 5"  (1.651 m)   Wt 107 lb 4 oz (48.6 kg)   SpO2 93%   BMI 17.85  kg/m   Wt Readings from Last 3 Encounters:  04/10/20 107 lb 4 oz (48.6 kg)  12/30/19 108 lb (49 kg)  11/15/19 110 lb 12.8 oz (50.3 kg)      Physical Exam Constitutional:      General: She is not in acute distress.    Appearance: Normal appearance. She is well-developed and underweight. She is not ill-appearing or toxic-appearing.  HENT:     Head: Normocephalic.     Right Ear: Hearing, tympanic membrane, ear canal and external ear normal. Tympanic membrane is not erythematous, retracted or bulging.     Left Ear: Hearing, tympanic membrane, ear canal and external ear normal. Tympanic membrane is not erythematous, retracted or bulging.     Nose: No mucosal edema or rhinorrhea.     Right Sinus: No maxillary sinus tenderness or frontal sinus tenderness.     Left Sinus: No  maxillary sinus tenderness or frontal sinus tenderness.     Mouth/Throat:     Pharynx: Uvula midline.  Eyes:     General: Lids are normal. Lids are everted, no foreign bodies appreciated.     Conjunctiva/sclera: Conjunctivae normal.     Pupils: Pupils are equal, round, and reactive to light.  Neck:     Thyroid: No thyroid mass or thyromegaly.     Vascular: No carotid bruit.     Trachea: Trachea normal.  Cardiovascular:     Rate and Rhythm: Normal rate and regular rhythm.     Pulses: Normal pulses.     Heart sounds: Normal heart sounds, S1 normal and S2 normal. No murmur heard. No friction rub. No gallop.   Pulmonary:     Effort: Pulmonary effort is normal. No tachypnea or respiratory distress.     Breath sounds: Normal breath sounds. No decreased breath sounds, wheezing, rhonchi or rales.  Abdominal:     General: Bowel sounds are normal.     Palpations: Abdomen is soft.     Tenderness: There is no abdominal tenderness.  Musculoskeletal:     Cervical back: Normal range of motion and neck supple.  Skin:    General: Skin is warm and dry.     Findings: No rash.  Neurological:     Mental Status: She is alert.  Psychiatric:        Mood and Affect: Mood is not anxious or depressed.        Speech: Speech normal.        Behavior: Behavior normal. Behavior is cooperative.        Thought Content: Thought content normal.        Judgment: Judgment normal.       Results for orders placed or performed in visit on 03/24/20  Urine Culture   Specimen: Urine  Result Value Ref Range   MICRO NUMBER: [id number]    SPECIMEN QUALITY: Adequate    Sample Source NOT GIVEN    STATUS: FINAL    ISOLATE 1: Escherichia coli (A)       Susceptibility   Escherichia coli - URINE CULTURE, REFLEX    AMOX/CLAVULANIC <=2 Sensitive     AMPICILLIN <=2 Sensitive     AMPICILLIN/SULBACTAM <=2 Sensitive     CEFAZOLIN* <=4 Not Reportable      * For infections other than uncomplicated UTIcaused by E. coli, K.  pneumoniae or P. mirabilis:Cefazolin is resistant if MIC > or = 8 mcg/mL.(Distinguishing susceptible versus intermediatefor isolates with MIC < or = 4 mcg/mL requiresadditional testing.)For uncomplicated UTI caused by E.  coli,K. pneumoniae or P. mirabilis: Cefazolin issusceptible if MIC <32 mcg/mL and predictssusceptible to the oral agents cefaclor, cefdinir,cefpodoxime, cefprozil, cefuroxime, cephalexinand loracarbef.    CEFEPIME <=1 Sensitive     CEFTRIAXONE <=1 Sensitive     CIPROFLOXACIN <=0.25 Sensitive     LEVOFLOXACIN <=0.12 Sensitive     ERTAPENEM <=0.5 Sensitive     GENTAMICIN <=1 Sensitive     IMIPENEM <=0.25 Sensitive     NITROFURANTOIN <=16 Sensitive     PIP/TAZO <=4 Sensitive     TOBRAMYCIN <=1 Sensitive     TRIMETH/SULFA* <=20 Sensitive      * For infections other than uncomplicated UTIcaused by E. coli, K. pneumoniae or P. mirabilis:Cefazolin is resistant if MIC > or = 8 mcg/mL.(Distinguishing susceptible versus intermediatefor isolates with MIC < or = 4 mcg/mL requiresadditional testing.)For uncomplicated UTI caused by E. coli,K. pneumoniae or P. mirabilis: Cefazolin issusceptible if MIC <32 mcg/mL and predictssusceptible to the oral agents cefaclor, cefdinir,cefpodoxime, cefprozil, cefuroxime, cephalexinand loracarbef.Legend:S = Susceptible  I = IntermediateR = Resistant  NS = Not susceptible* = Not tested  NR = Not reported**NN = See antimicrobic comments  CBC with Differential/Platelet  Result Value Ref Range   WBC 4.9 4.0 - 10.5 K/uL   RBC 3.86 (L) 3.87 - 5.11 Mil/uL   Hemoglobin 12.5 12.0 - 15.0 g/dL   HCT 37.2 36.0 - 46.0 %   MCV 96.4 78.0 - 100.0 fl   MCHC 33.7 30.0 - 36.0 g/dL   RDW 13.2 11.5 - 15.5 %   Platelets 233.0 150.0 - 400.0 K/uL   Neutrophils Relative % 58.4 43.0 - 77.0 %   Lymphocytes Relative 30.1 12.0 - 46.0 %   Monocytes Relative 9.0 3.0 - 12.0 %   Eosinophils Relative 2.1 0.0 - 5.0 %   Basophils Relative 0.4 0.0 - 3.0 %   Neutro Abs 2.8 1.4 - 7.7 K/uL    Lymphs Abs 1.5 0.7 - 4.0 K/uL   Monocytes Absolute 0.4 0.1 - 1.0 K/uL   Eosinophils Absolute 0.1 0.0 - 0.7 K/uL   Basophils Absolute 0.0 0.0 - 0.1 K/uL  VITAMIN D 25 Hydroxy (Vit-D Deficiency, Fractures)  Result Value Ref Range   VITD 58.72 30.00 - 100.00 ng/mL  Comprehensive metabolic panel  Result Value Ref Range   Sodium 136 135 - 145 mEq/L   Potassium 4.2 3.5 - 5.1 mEq/L   Chloride 98 96 - 112 mEq/L   CO2 31 19 - 32 mEq/L   Glucose, Bld 94 70 - 99 mg/dL   BUN 17 6 - 23 mg/dL   Creatinine, Ser 1.03 0.40 - 1.20 mg/dL   Total Bilirubin 0.6 0.2 - 1.2 mg/dL   Alkaline Phosphatase 42 39 - 117 U/L   AST 22 0 - 37 U/L   ALT 11 0 - 35 U/L   Total Protein 7.5 6.0 - 8.3 g/dL   Albumin 4.2 3.5 - 5.2 g/dL   GFR 49.26 (L) >60.00 mL/min   Calcium 9.9 8.4 - 10.5 mg/dL  Urinalysis, Routine w reflex microscopic  Result Value Ref Range   Color, Urine YELLOW Yellow;Lt. Yellow;Straw;Dark Yellow;Amber;Green;Red;Brown   APPearance Cloudy (A) Clear;Turbid;Slightly Cloudy;Cloudy   Specific Gravity, Urine 1.010 1.000 - 1.030   pH 6.0 5.0 - 8.0   Total Protein, Urine NEGATIVE Negative   Urine Glucose NEGATIVE Negative   Ketones, ur NEGATIVE Negative   Bilirubin Urine NEGATIVE Negative   Hgb urine dipstick MODERATE (A) Negative   Urobilinogen, UA 0.2 0.0 - 1.0   Leukocytes,Ua  LARGE (A) Negative   Nitrite POSITIVE (A) Negative   WBC, UA TNTC(>50/hpf) (A) 0-2/hpf   RBC / HPF 7-10/hpf (A) 0-2/hpf   Mucus, UA Presence of (A) None   Squamous Epithelial / LPF Rare(0-4/hpf) Rare(0-4/hpf)   Bacteria, UA Many(>50/hpf) (A) None    This visit occurred during the SARS-CoV-2 public health emergency.  Safety protocols were in place, including screening questions prior to the visit, additional usage of staff PPE, and extensive cleaning of exam room while observing appropriate contact time as indicated for disinfecting solutions.   COVID 19 screen:  No recent travel or known exposure to COVID19 The  patient denies respiratory symptoms of COVID 19 at this time. The importance of social distancing was discussed today.   Assessment and Plan The patient's preventative maintenance and recommended screening tests for an annual wellness exam were reviewed in full today. Brought up to date unless services declined.  Counselled on the importance of diet, exercise, and its role in overall health and mortality. The patient's FH and SH was reviewed, including their home life, tobacco status, and drug and alcohol status.    Vaccines:reviewed, uptodate except refused td and shingles vaccines Pap/DVE:Not indicated given age Mammo:not indicated given age Bone Density:2018DEXA..Osteoporosis in hip started on alendronate weekly 12/2016 but had severe SE. Taking ca and Vit D She does not want new med at this time. DUE for re-eval this year 2020.. she wishes to hold off at this time. Colon:Not indicated Smoking Status:none ETOH/ drug IWL:NLGX  Problem List Items Addressed This Visit    Absence of bladder continence    Eval for  UTI with UA ( given positive for LE... send Thank you. For  culture      Relevant Orders   POCT Urinalysis Dipstick (Automated) (Completed)   Urine Culture (Completed)   Diarrhea    Increase fiber by starting metamucil to bulk up stool.. Start trial of probiotics.      GAD (generalized anxiety disorder)    Moderate control.  On sertraline 100 mg daily and trazodone 100 mg  Using klonopin 1/2 tab twice daily      Memory loss     Likely Alzheimer's disease. Eval with labs fr secondary cause and have pt return for memory testing.      Relevant Orders   Vitamin B12 (Completed)   CBC with Differential/Platelet (Completed)   T3, free (Completed)   TSH (Completed)   T4, free (Completed)   VITAMIN D 25 Hydroxy (Vit-D Deficiency, Fractures) (Completed)   Moderate recurrent major depression (HCC)    Stable, chronic.  Continue current medication.    On  sertraline 100 mg daily and trazodone 100 mg  Using klonopin 1/2 tab twice daily       Other Visit Diagnoses    Routine general medical examination at a health care facility    -  Primary       Eliezer Lofts, MD

## 2020-04-11 LAB — URINE CULTURE
MICRO NUMBER:: 11665158
Result:: NO GROWTH
SPECIMEN QUALITY:: ADEQUATE

## 2020-04-23 ENCOUNTER — Other Ambulatory Visit: Payer: Self-pay | Admitting: Family Medicine

## 2020-04-24 NOTE — Telephone Encounter (Signed)
Last office visit 03.18.2022 for CPE.  Last refilled 12/24/2019 for #90 with no refills.  Next Appt: 05/07/2020 for follow up memory.

## 2020-05-07 ENCOUNTER — Encounter: Payer: Self-pay | Admitting: Family Medicine

## 2020-05-07 ENCOUNTER — Other Ambulatory Visit: Payer: Self-pay

## 2020-05-07 ENCOUNTER — Ambulatory Visit: Payer: Medicare PPO | Admitting: Family Medicine

## 2020-05-07 VITALS — BP 150/70 | HR 52 | Temp 98.4°F | Ht 65.0 in | Wt 108.5 lb

## 2020-05-07 DIAGNOSIS — R413 Other amnesia: Secondary | ICD-10-CM | POA: Diagnosis not present

## 2020-05-07 DIAGNOSIS — R194 Change in bowel habit: Secondary | ICD-10-CM

## 2020-05-07 NOTE — Assessment & Plan Note (Signed)
Resolved with metamucil and probiotics.

## 2020-05-07 NOTE — Progress Notes (Signed)
Patient ID: Catherine Young, female    DOB: [birth year], 87 y.o.   MRN: [id number]  This visit was conducted in person.  BP (!) 150/70   Pulse (!) 52   Temp 98.4 F (36.9 C) (Temporal)   Ht 5\' 5"  (1.651 m)   Wt 108 lb 8 oz (49.2 kg)   SpO2 96%   BMI 18.06 kg/m   CC:  Chief Complaint  Patient presents with  . Follow-up    Memory and Stool Issues    Subjective:   HPI: Catherine Young is a 87 y.o. female presenting on 05/07/2020 for Follow-up (Memory and Stool Issues)  At last OV 04/24/2020.Marland Kitchendiscussed loose stools and memory loss.    Memory loss.. gradually worsening in last year. Forgetting where she puts stuff, short term memory.  No issue with remembering pills.  3 Sisters with Alzheimers.   UPDATE: normal thyroid, vitamins, CMET, etc. No change from that last OV with memory... will do MMSE today.  She has had years of issues with bowels... worsening in last few months, she is scared to go out.  Loose watery stools, has to wear pads. episodes of diarrhea come and go but in between she has stool leaking  Without realizing it. Low abd pain, cramping prior to BM.  She does not eat much.. snacking. UPDATE: started metamucil and probiotic... she has a lot of improvement in stool consistency. Cbc normal, Clear UCx.  Urinary irritation is better.   Relevant past medical, surgical, family and social history reviewed and updated as indicated. Interim medical history since our last visit reviewed. Allergies and medications reviewed and updated. Outpatient Medications Prior to Visit  Medication Sig Dispense Refill  . amLODipine (NORVASC) 2.5 MG tablet Take 1 tablet (2.5 mg total) by mouth daily. 30 tablet 5  . Cholecalciferol (VITAMIN D3) 50 MCG (2000 UT) capsule Take 2,000 Units by mouth daily.    . clonazePAM (KLONOPIN) 1 MG tablet TAKE 1/2 TABLET(0.5 MG) BY MOUTH TWICE DAILY AS NEEDED FOR ANXIETY 90 tablet 0  . Cyanocobalamin (B-12 PO) Take by mouth.    . sertraline  (ZOLOFT) 100 MG tablet TAKE 1 TABLET(100 MG) BY MOUTH AT BEDTIME 90 tablet 0  . traZODone (DESYREL) 100 MG tablet TAKE 1 TABLET(100 MG) BY MOUTH AT BEDTIME AS NEEDED FOR SLEEP 90 tablet 0   No facility-administered medications prior to visit.     Per HPI unless specifically indicated in ROS section below Review of Systems Objective:  BP (!) 150/70   Pulse (!) 52   Temp 98.4 F (36.9 C) (Temporal)   Ht 5\' 5"  (1.651 m)   Wt 108 lb 8 oz (49.2 kg)   SpO2 96%   BMI 18.06 kg/m   Wt Readings from Last 3 Encounters:  05/07/20 108 lb 8 oz (49.2 kg)  04/10/20 107 lb 4 oz (48.6 kg)  12/30/19 108 lb (49 kg)      Physical Exam Constitutional:      General: She is not in acute distress.    Appearance: Normal appearance. She is well-developed. She is not ill-appearing or toxic-appearing.  HENT:     Head: Normocephalic.     Right Ear: Hearing, tympanic membrane, ear canal and external ear normal. Tympanic membrane is not erythematous, retracted or bulging.     Left Ear: Hearing, tympanic membrane, ear canal and external ear normal. Tympanic membrane is not erythematous, retracted or bulging.     Nose: No mucosal edema or rhinorrhea.  Right Sinus: No maxillary sinus tenderness or frontal sinus tenderness.     Left Sinus: No maxillary sinus tenderness or frontal sinus tenderness.     Mouth/Throat:     Pharynx: Uvula midline.  Eyes:     General: Lids are normal. Lids are everted, no foreign bodies appreciated.     Conjunctiva/sclera: Conjunctivae normal.     Pupils: Pupils are equal, round, and reactive to light.  Neck:     Thyroid: No thyroid mass or thyromegaly.     Vascular: No carotid bruit.     Trachea: Trachea normal.  Cardiovascular:     Rate and Rhythm: Normal rate and regular rhythm.     Pulses: Normal pulses.     Heart sounds: Normal heart sounds, S1 normal and S2 normal. No murmur heard. No friction rub. No gallop.   Pulmonary:     Effort: Pulmonary effort is normal. No  tachypnea or respiratory distress.     Breath sounds: Normal breath sounds. No decreased breath sounds, wheezing, rhonchi or rales.  Abdominal:     General: Bowel sounds are normal.     Palpations: Abdomen is soft.     Tenderness: There is no abdominal tenderness.  Musculoskeletal:     Cervical back: Normal range of motion and neck supple.  Skin:    General: Skin is warm and dry.     Findings: No rash.  Neurological:     Mental Status: She is alert and oriented to person, place, and time. Mental status is at baseline.  Psychiatric:        Mood and Affect: Mood is not anxious or depressed.        Speech: Speech normal.        Behavior: Behavior normal. Behavior is cooperative.        Thought Content: Thought content normal.        Cognition and Memory: Cognition and memory normal.        Judgment: Judgment normal.       MMSE 29/30 Results for orders placed or performed in visit on 04/10/20  Urine Culture   Specimen: Urine  Result Value Ref Range   MICRO NUMBER: [id number]    SPECIMEN QUALITY: Adequate    Sample Source NOT GIVEN    STATUS: FINAL    Result: No Growth   Vitamin B12  Result Value Ref Range   Vitamin B-12 346 211 - 911 pg/mL  CBC with Differential/Platelet  Result Value Ref Range   WBC 5.1 4.0 - 10.5 K/uL   RBC 3.96 3.87 - 5.11 Mil/uL   Hemoglobin 12.6 12.0 - 15.0 g/dL   HCT 37.9 36.0 - 46.0 %   MCV 95.9 78.0 - 100.0 fl   MCHC 33.3 30.0 - 36.0 g/dL   RDW 13.0 11.5 - 15.5 %   Platelets 219.0 150.0 - 400.0 K/uL   Neutrophils Relative % 61.8 43.0 - 77.0 %   Lymphocytes Relative 27.9 12.0 - 46.0 %   Monocytes Relative 8.5 3.0 - 12.0 %   Eosinophils Relative 1.4 0.0 - 5.0 %   Basophils Relative 0.4 0.0 - 3.0 %   Neutro Abs 3.2 1.4 - 7.7 K/uL   Lymphs Abs 1.4 0.7 - 4.0 K/uL   Monocytes Absolute 0.4 0.1 - 1.0 K/uL   Eosinophils Absolute 0.1 0.0 - 0.7 K/uL   Basophils Absolute 0.0 0.0 - 0.1 K/uL  T3, free  Result Value Ref Range   T3, Free 3.4 2.3 - 4.2  pg/mL  TSH  Result Value Ref Range   TSH 1.43 0.35 - 4.50 uIU/mL  T4, free  Result Value Ref Range   Free T4 0.78 0.60 - 1.60 ng/dL  VITAMIN D 25 Hydroxy (Vit-D Deficiency, Fractures)  Result Value Ref Range   VITD 60.47 30.00 - 100.00 ng/mL  POCT Urinalysis Dipstick (Automated)  Result Value Ref Range   Color, UA Yellow    Clarity, UA Clear    Glucose, UA Negative Negative   Bilirubin, UA Negative    Ketones, UA Negative    Spec Grav, UA 1.015 1.010 - 1.025   Blood, UA Moderate    pH, UA 6.0 5.0 - 8.0   Protein, UA Positive (A) Negative   Urobilinogen, UA 0.2 0.2 or 1.0 E.U./dL   Nitrite, UA Negative    Leukocytes, UA Negative Negative    This visit occurred during the SARS-CoV-2 public health emergency.  Safety protocols were in place, including screening questions prior to the visit, additional usage of staff PPE, and extensive cleaning of exam room while observing appropriate contact time as indicated for disinfecting solutions.   COVID 19 screen:  No recent travel or known exposure to COVID19 The patient denies respiratory symptoms of COVID 19 at this time. The importance of social distancing was discussed today.   Assessment and Plan    Problem List Items Addressed This Visit    Change in stool habits    Resolved with metamucil and probiotics.      Memory loss - Primary     3 sisters with Alzheimer's   Labs work up with TSH, Vit D, B12, CMET normal 2022  MMSE 05/07/2020 29/30 Clock drawing  normal  animal recall 6  Very possible early Alzheimer's but minimal change. She is not interested in med like aricept as it was not helpful in sister. Will continue to follow.  May be associated with inadequate control of GAD.          Eliezer Lofts, MD

## 2020-05-07 NOTE — Patient Instructions (Addendum)
Continue Metamucil and probiotic given stool issue is improved. Call if memory issue worsening or if you are interested in medication for memory.

## 2020-05-07 NOTE — Assessment & Plan Note (Addendum)
3 sisters with Alzheimer's   Labs work up with TSH, Vit D, B12, CMET normal 2022  MMSE 05/07/2020 29/30 Clock drawing  normal  animal recall 6  Very possible early Alzheimer's but minimal change. She is not interested in med like aricept as it was not helpful in sister. Will continue to follow.  May be associated with inadequate control of GAD.

## 2020-05-25 DIAGNOSIS — R32 Unspecified urinary incontinence: Secondary | ICD-10-CM | POA: Insufficient documentation

## 2020-05-25 NOTE — Assessment & Plan Note (Signed)
Eval for  UTI with UA ( given positive for LE... send Thank you. For  culture

## 2020-05-25 NOTE — Assessment & Plan Note (Signed)
Likely Alzheimer's disease. Eval with labs fr secondary cause and have pt return for memory testing.

## 2020-05-25 NOTE — Assessment & Plan Note (Addendum)
Increase fiber by starting metamucil to bulk up stool.. Start trial of probiotics.

## 2020-05-25 NOTE — Assessment & Plan Note (Signed)
Stable, chronic.  Continue current medication.    On sertraline 100 mg daily and trazodone 100 mg  Using klonopin 1/2 tab twice daily

## 2020-05-25 NOTE — Assessment & Plan Note (Signed)
Moderate control.  On sertraline 100 mg daily and trazodone 100 mg  Using klonopin 1/2 tab twice daily

## 2020-05-28 ENCOUNTER — Other Ambulatory Visit: Payer: Self-pay | Admitting: Family Medicine

## 2020-05-29 ENCOUNTER — Other Ambulatory Visit: Payer: Self-pay | Admitting: *Deleted

## 2020-05-29 MED ORDER — AMLODIPINE BESYLATE 2.5 MG PO TABS
2.5000 mg | ORAL_TABLET | Freq: Every day | ORAL | 7 refills | Status: DC
Start: 2020-05-29 — End: 2021-02-03

## 2020-07-30 ENCOUNTER — Other Ambulatory Visit: Payer: Self-pay | Admitting: Family Medicine

## 2020-07-30 NOTE — Telephone Encounter (Signed)
Last office visit 05/07/2020 for memory loss.  Last refilled 04/24/2020 for #90 with no refills.  No future appointment with PCP.

## 2020-08-04 ENCOUNTER — Telehealth: Payer: Self-pay

## 2020-08-04 NOTE — Telephone Encounter (Signed)
Pt called and had missed getting 2nd covid booster at [facility] and [place] [facility] told pt no make up day planned. Pt wanted to know where could get 2nd covid booster near where she lives. I called [facility] [phone] and spoke with [facility] and he said [facility] is still giving [facility] and pt does not have to have appt or pt can call for appt. Giving between 9 Am- 8 PM M-F. Pt voiced understanding and appreciative and nothing further needed at this time.

## 2020-08-13 DIAGNOSIS — Z0100 Encounter for examination of eyes and vision without abnormal findings: Secondary | ICD-10-CM | POA: Diagnosis not present

## 2020-08-13 DIAGNOSIS — H2512 Age-related nuclear cataract, left eye: Secondary | ICD-10-CM | POA: Diagnosis not present

## 2020-10-20 ENCOUNTER — Ambulatory Visit (INDEPENDENT_AMBULATORY_CARE_PROVIDER_SITE_OTHER): Payer: Medicare PPO

## 2020-10-20 ENCOUNTER — Other Ambulatory Visit: Payer: Self-pay

## 2020-10-20 ENCOUNTER — Ambulatory Visit: Payer: Medicare PPO | Admitting: Family Medicine

## 2020-10-20 ENCOUNTER — Encounter: Payer: Self-pay | Admitting: Family Medicine

## 2020-10-20 VITALS — BP 122/72 | HR 52 | Temp 97.0°F | Ht 65.0 in | Wt 110.5 lb

## 2020-10-20 DIAGNOSIS — M545 Low back pain, unspecified: Secondary | ICD-10-CM

## 2020-10-20 DIAGNOSIS — M810 Age-related osteoporosis without current pathological fracture: Secondary | ICD-10-CM

## 2020-10-20 DIAGNOSIS — K5909 Other constipation: Secondary | ICD-10-CM | POA: Diagnosis not present

## 2020-10-20 DIAGNOSIS — M48061 Spinal stenosis, lumbar region without neurogenic claudication: Secondary | ICD-10-CM | POA: Diagnosis not present

## 2020-10-20 MED ORDER — HYDROCODONE-ACETAMINOPHEN 5-325 MG PO TABS: 1.0000 | ORAL_TABLET | Freq: Four times a day (QID) | ORAL | 0 refills | Status: DC | PRN

## 2020-10-20 MED ORDER — LIDOCAINE 5 % EX OINT
1.0000 | TOPICAL_OINTMENT | CUTANEOUS | 0 refills | Status: DC | PRN
Start: 2020-10-20 — End: 2020-10-27

## 2020-10-20 NOTE — Patient Instructions (Addendum)
Pain control - lidocaine ointment - Hydrocodone-acetaminophen -- DO NOT TAKE WITH Clonazepam or with more than 1 tylenol pill      Constipation   Constipation is a common issue. Often it is related to diet and occasionally medications.    What you can do to treat your symptoms 1) Fiber -- Eat more fiber rich foods: beans, broccoli, berries, avocados, popcorn, pear/apple, green peas, turnip greens, brussels sprouts, whole grains (barley, bran, quinoa, oatmeal) -- Take a Fiber supplement: Psyllium (Metamucil)  -- Could also eat Prunes daily  2) Hydration  -- Drink more water: Try to drink 64 oz of water per day  3) Exercise -- Moderate exercise (walking, jogging, biking) for 30 minutes, 5 days a week  4) Dedicate time for Bowel movements - do not delay  5) Stool Softener  - Docusate Sodium (Colace) 100 mg daily or twice daily as needed   If 4-6 weeks have passed and the above has not helped then start the following 6) Laxatives -- Polyethylene Glycol (Miralax) - begin with once daily. After a few days can increase to twice daily Or -- Magnesium Citrate -- Common side effect is nausea and diarrhea -- can try if still not improved  Treating chronic constipation is often about finding the right amount of medication and fiber to keep you regular and comfortable. For some people that may be daily metamucil and colace every other day. For others it may be Metamucil and colace twice daily and Miralax 3 times a week. The goal is to go slow and listen to your body. And normal can be anywhere from 2-3 soft bowel movements a day to 1 bowel movement every 2-3 days.

## 2020-10-20 NOTE — Assessment & Plan Note (Signed)
Pt notes significant pain - unable to sleep at night. Lidocaine topical and short course of hydrocodone supplied. Discussed that she cannot take hydrocodone with clonazepam so will do pain medication at night and continue clonazepam once daily AM. Advised PCP f/u if needing additional pain medication. No fracture on my read - but will f/u final.

## 2020-10-20 NOTE — Progress Notes (Signed)
Subjective:     Catherine Young is a 87 y.o. female presenting for Back Pain (Spinal stenosis. Pain started x 1 week. )     Back Pain   #Spinal stenosis - hx of back pain - MRI was done 2018 - does note chronic leg and foot pain - but no hx of back pain - work up last Tuesday and started having back  - severe pain - getting up and down is very hard - tried tylenol 6 pills per day - extra strength w/o improvement  Notes chronic constipation - and no bm x 2 weeks  Review of Systems  Musculoskeletal:  Positive for back pain.  No issues with bowel or bladder control - constant issue   Social History   Tobacco Use  Smoking Status Former   Types: Cigarettes   Quit date: 01/24/1993   Years since quitting: 27.7  Smokeless Tobacco Never  Tobacco Comments   quit 1995        Objective:    BP Readings from Last 3 Encounters:  10/20/20 122/72  05/07/20 (!) 150/70  04/10/20 (!) 170/71   Wt Readings from Last 3 Encounters:  10/20/20 110 lb 8 oz (50.1 kg)  05/07/20 108 lb 8 oz (49.2 kg)  04/10/20 107 lb 4 oz (48.6 kg)    BP 122/72   Pulse (!) 52   Temp (!) 97 F (36.1 C) (Temporal)   Ht 5\' 5"  (1.651 m)   Wt 110 lb 8 oz (50.1 kg)   SpO2 96%   BMI 18.39 kg/m    Physical Exam Constitutional:      General: She is not in acute distress.    Appearance: She is well-developed. She is not diaphoretic.  HENT:     Right Ear: External ear normal.     Left Ear: External ear normal.  Eyes:     Conjunctiva/sclera: Conjunctivae normal.  Cardiovascular:     Rate and Rhythm: Normal rate.  Pulmonary:     Effort: Pulmonary effort is normal.  Musculoskeletal:     Cervical back: Neck supple.     Comments: Back: Inspection: kyphosis, slowly transitioning to standing with grimace Palpation: TTP along the lumbar spine and Paraspinous muscles ROM: slow but normal Strength: normal  Skin:    General: Skin is warm and dry.     Capillary Refill: Capillary refill takes  less than 2 seconds.  Neurological:     Mental Status: She is alert. Mental status is at baseline.  Psychiatric:        Mood and Affect: Mood normal.        Behavior: Behavior normal.    XR Lumbar: no fracture, significant stool burden on my read      Assessment & Plan:   Problem List Items Addressed This Visit       Digestive   Chronic constipation    Significant stool burden on imaging and no bm x 2 weeks. Advised stool softeners and even laxative or suppository - this could also be contributing to her pain.         Musculoskeletal and Integument   Osteoporosis - Primary   Relevant Medications   lidocaine (XYLOCAINE) 5 % ointment   Other Relevant Orders   DG Lumbar Spine Complete     Other   Spinal stenosis    Pt notes significant pain - unable to sleep at night. Lidocaine topical and short course of hydrocodone supplied. Discussed that she cannot take hydrocodone with clonazepam  so will do pain medication at night and continue clonazepam once daily AM. Advised PCP f/u if needing additional pain medication. No fracture on my read - but will f/u final.       Relevant Medications   lidocaine (XYLOCAINE) 5 % ointment   Other Relevant Orders   DG Lumbar Spine Complete   Other Visit Diagnoses     Acute bilateral low back pain without sciatica       Relevant Medications   lidocaine (XYLOCAINE) 5 % ointment   HYDROcodone-acetaminophen (NORCO/VICODIN) 5-325 MG tablet   Other Relevant Orders   DG Lumbar Spine Complete        Return in about 1 week (around 10/27/2020), or if symptoms worsen or fail to improve.  Lesleigh Noe, MD  This visit occurred during the SARS-CoV-2 public health emergency.  Safety protocols were in place, including screening questions prior to the visit, additional usage of staff PPE, and extensive cleaning of exam room while observing appropriate contact time as indicated for disinfecting solutions.

## 2020-10-20 NOTE — Assessment & Plan Note (Signed)
Significant stool burden on imaging and no bm x 2 weeks. Advised stool softeners and even laxative or suppository - this could also be contributing to her pain.

## 2020-10-26 ENCOUNTER — Telehealth: Payer: Self-pay | Admitting: Family Medicine

## 2020-10-26 NOTE — Telephone Encounter (Signed)
  Encourage patient to contact the pharmacy for refills or they can request refills through [place]:  Please schedule appointment if longer than 1 year  NEXT APPOINTMENT DATE:04/02/21  MEDICATION:HYDROcodone-acetaminophen (NORCO/VICODIN) 5-325 MG tablet  Is the patient out of medication? 1 pill left  PHARMACY:[id number] - [place],  - [address]  Let patient know to contact pharmacy at the end of the day to make sure medication is ready.  Please notify patient to allow 48-72 hours to process  CLINICAL FILLS OUT ALL BELOW:   LAST REFILL:  QTY:  REFILL DATE:    OTHER COMMENTS:    Okay for refill?  Please advise

## 2020-10-26 NOTE — Telephone Encounter (Addendum)
Called mrs. Terance Hart and got her scheduled virtually tomorrow at 2:40 pm due to no transportation.

## 2020-10-26 NOTE — Telephone Encounter (Signed)
Noted  

## 2020-10-26 NOTE — Telephone Encounter (Signed)
Per Dr Verda Cumins note on 9-27. If pt will require more hydrocodone, she will need an OV with [facility]sole to F/U. Please get pt scheduled. Thanks

## 2020-10-27 ENCOUNTER — Telehealth (INDEPENDENT_AMBULATORY_CARE_PROVIDER_SITE_OTHER): Payer: Medicare PPO | Admitting: Family Medicine

## 2020-10-27 ENCOUNTER — Other Ambulatory Visit: Payer: Self-pay

## 2020-10-27 VITALS — Wt 110.0 lb

## 2020-10-27 DIAGNOSIS — K5909 Other constipation: Secondary | ICD-10-CM

## 2020-10-27 DIAGNOSIS — M810 Age-related osteoporosis without current pathological fracture: Secondary | ICD-10-CM | POA: Diagnosis not present

## 2020-10-27 DIAGNOSIS — M48061 Spinal stenosis, lumbar region without neurogenic claudication: Secondary | ICD-10-CM | POA: Diagnosis not present

## 2020-10-27 DIAGNOSIS — S32010A Wedge compression fracture of first lumbar vertebra, initial encounter for closed fracture: Secondary | ICD-10-CM | POA: Diagnosis not present

## 2020-10-27 MED ORDER — GABAPENTIN 100 MG PO CAPS: 100.0000 mg | ORAL_CAPSULE | Freq: Every day | ORAL | 3 refills | Status: DC

## 2020-10-27 NOTE — Patient Instructions (Addendum)
Start Neurontin at bedtime .  Start miralax 17 g  once or twice a day for constipation.  May not need to use trazodone if gabapentin  making you sleepy at night.  Make appt for virtual visit for follow up pain in 1-2 weeks.

## 2020-10-27 NOTE — Progress Notes (Signed)
VIRTUAL VISIT Due to national recommendations of social distancing due to [place] 19, a virtual visit is felt to be most appropriate for this patient at this time.   I connected with the patient on 10/27/20 at  2:40 PM EDT by virtual telehealth platform and verified that I am speaking with the correct person using two identifiers.   I discussed the limitations, risks, security and privacy concerns of performing an evaluation and management service by  virtual telehealth platform and the availability of in person appointments. I also discussed with the patient that there may be a patient responsible charge related to this service. The patient expressed understanding and agreed to proceed.  Patient location: Home Provider Location: [place] Participants: Eliezer Lofts and Lorette Ang   Chief Complaint  Patient presents with   Back Pain    Spinal stenosis    History of Present Illness: 87 year old female with history of osteoporosis spinal stenosis presents for follow up back pain.   She saw Dr. Einar Pheasant on 10/20/2020 for flare up of back pain. Note reviewed in detail. In past has always been leg and foot pain chronically but was having NEW back pain.  Non-focal exam.  No proceeding fall/ injury. Pt was unable to sleep at night.. given lidocaine patches  (were too expensive) and short course of hydrocodone Lumbar X-ray showed moderate L1 compression fracture new compared to 02/22/2016, lumbar spondylosis    She reports pain is at level of waist, pain greatest right vs left.  Increase pain with movement. 5/10 on pain scale.  No new numbness or weakness in leg.  Using tylenol for pain.. using salonpas patches.     Last saw Dr.Chasnis for back pain in 01/2020.. steroid injection did not help. PDMP reviewed during this encounter.  Hydrocodone help some with pain... constipation stable 2 BM in last 7 days.. using suppositories.   She has never been tried neurontin or  lyrica.   COVID 19 screen No recent travel or known exposure to COVID19 The patient denies respiratory symptoms of COVID 19 at this time.  The importance of social distancing was discussed today.   Review of Systems  Constitutional:  Negative for chills and fever.  HENT:  Negative for congestion and ear pain.   Eyes:  Negative for pain and redness.  Respiratory:  Negative for cough and shortness of breath.   Cardiovascular:  Negative for chest pain, palpitations and leg swelling.  Gastrointestinal:  Negative for abdominal pain, blood in stool, constipation, diarrhea, nausea and vomiting.  Genitourinary:  Negative for dysuria.  Musculoskeletal:  Negative for falls and myalgias.  Skin:  Negative for rash.  Neurological:  Negative for dizziness.  Psychiatric/Behavioral:  Negative for depression. The patient is not nervous/anxious.      Past Medical History:  Diagnosis Date   Arthritis    Basal cell carcinoma of tragus, left    Cataract    right   Complication of anesthesia    COULD'T MOVE WHEN WAKING UP AFTER SECONT COLON SURGERY   Depression    Hypertension    Intestinal obstruction ([place])    Osteoporosis     reports that she quit smoking about 27 years ago. She has never used smokeless tobacco. She reports current alcohol use of about 7.0 standard drinks per week. She reports that she does not use drugs.   Current Outpatient Medications:    amLODipine (NORVASC) 2.5 MG tablet, Take 1 tablet (2.5 mg total) by mouth daily., Disp:  30 tablet, Rfl: 7   clonazePAM (KLONOPIN) 1 MG tablet, TAKE 1/2 TABLET(0.5 MG) BY MOUTH TWICE DAILY AS NEEDED FOR ANXIETY, Disp: 90 tablet, Rfl: 0   lidocaine (XYLOCAINE) 5 % ointment, Apply 1 application topically as needed for moderate pain (apply to back)., Disp: 35.44 g, Rfl: 0   sertraline (ZOLOFT) 100 MG tablet, TAKE 1 TABLET(100 MG) BY MOUTH AT BEDTIME, Disp: 90 tablet, Rfl: 1   traZODone (DESYREL) 100 MG tablet, TAKE 1 TABLET(100 MG) BY MOUTH AT  BEDTIME AS NEEDED FOR SLEEP, Disp: 90 tablet, Rfl: 1   HYDROcodone-acetaminophen (NORCO/VICODIN) 5-325 MG tablet, Take 1 tablet by mouth every 6 (six) hours as needed for moderate pain. (Patient not taking: Reported on 10/27/2020), Disp: 7 tablet, Rfl: 0   Observations/Objective: Weight 110 lb (49.9 kg).   Constitutional:      General: The patient is not in acute distress. Pulmonary:     Effort: Pulmonary effort is normal. No respiratory distress.  Neurological:     Mental Status: The patient is alert and oriented to person, place, and time.  Psychiatric:        Mood and Affect: Mood normal.        Behavior: Behavior normal.   Assessment and Plan Problem List Items Addressed This Visit     Chronic constipation    Poor control, worse recently on narcotic.. treat with mirlax 17 gm 1-2 times daily.      Closed compression fracture of body of L1 vertebra (HCC)    Unclear if causing new pain.. pain is nonfocal and I am not able to examine pt.  needs osteoporosis treatment.       Osteoporosis - Primary    In past intolerant of  Bisphosphonate, previously not interested I treatement. Will discuss again at upcoming [place].. consdier starting prolia.       Spinal stenosis    Acute on chronic  Will try trial of Neurontin, with close follow up.  Hold trazodone at nigiht if Neurontin causing sedation despite low dose.  MAy need to titrate to higher dose or daytime use.         I discussed the assessment and treatment plan with the patient. The patient was provided an opportunity to ask questions and all were answered. The patient agreed with the plan and demonstrated an understanding of the instructions.   The patient was advised to call back or seek an in-person evaluation if the symptoms worsen or if the condition fails to improve as anticipated.     Eliezer Lofts, MD

## 2020-10-27 NOTE — Assessment & Plan Note (Signed)
Unclear if causing new pain.. pain is nonfocal and I am not able to examine pt.  needs osteoporosis treatment.

## 2020-10-27 NOTE — Assessment & Plan Note (Signed)
Poor control, worse recently on narcotic.. treat with mirlax 17 gm 1-2 times daily.

## 2020-10-27 NOTE — Assessment & Plan Note (Signed)
In past intolerant of  Bisphosphonate, previously not interested I treatement. Will discuss again at upcoming [place].. consdier starting prolia.

## 2020-10-27 NOTE — Assessment & Plan Note (Signed)
Acute on chronic  Will try trial of Neurontin, with close follow up.  Hold trazodone at nigiht if Neurontin causing sedation despite low dose.  MAy need to titrate to higher dose or daytime use.

## 2020-11-03 ENCOUNTER — Other Ambulatory Visit: Payer: Self-pay

## 2020-11-03 ENCOUNTER — Encounter: Payer: Self-pay | Admitting: Family Medicine

## 2020-11-03 ENCOUNTER — Ambulatory Visit: Payer: Medicare PPO | Admitting: Family Medicine

## 2020-11-03 VITALS — BP 142/74 | HR 50 | Temp 97.2°F | Ht 65.0 in | Wt 109.0 lb

## 2020-11-03 DIAGNOSIS — M48061 Spinal stenosis, lumbar region without neurogenic claudication: Secondary | ICD-10-CM | POA: Diagnosis not present

## 2020-11-03 DIAGNOSIS — M810 Age-related osteoporosis without current pathological fracture: Secondary | ICD-10-CM | POA: Diagnosis not present

## 2020-11-03 DIAGNOSIS — K5909 Other constipation: Secondary | ICD-10-CM

## 2020-11-03 DIAGNOSIS — S32010A Wedge compression fracture of first lumbar vertebra, initial encounter for closed fracture: Secondary | ICD-10-CM | POA: Diagnosis not present

## 2020-11-03 MED ORDER — GABAPENTIN 100 MG PO CAPS: 100.0000 mg | ORAL_CAPSULE | Freq: Three times a day (TID) | ORAL | 3 refills | Status: DC

## 2020-11-03 MED ORDER — HYDROCODONE-ACETAMINOPHEN 5-325 MG PO TABS: 0.5000 | ORAL_TABLET | Freq: Every day | ORAL | 0 refills | Status: DC | PRN

## 2020-11-03 NOTE — Assessment & Plan Note (Signed)
Acute,  Current pain is at site of new fracture.  Will increase Neurontin to 100 mg TID if tolerated. Can use hydrocodone prn in limited fashion for breakthrough pain.  If severe pain or not improving.. she will consider vertebroplasty consult with IR.

## 2020-11-03 NOTE — Assessment & Plan Note (Signed)
SE to bisphosphonate.  Discussed options.. pt willing to start Prolia q 6 months. Will need labs for Ca and Cr. Lab Results  Component Value Date   CREATININE 1.03 03/24/2020   CREATININE 0.69 11/11/2019   CREATININE 0.80 10/21/2019

## 2020-11-03 NOTE — Assessment & Plan Note (Signed)
Resolved with Miralax.

## 2020-11-03 NOTE — Assessment & Plan Note (Signed)
Crhonic,   Hopefully Neurontin will help with this as well.

## 2020-11-03 NOTE — Patient Instructions (Addendum)
Increase Neurontin to 100 mg In AM and PM. If tolerate after 4-5 days but still pain, can increase to three times daily.  Can use 1/2 to 1 tablet daily as needed of hydrocodone for pain.  We will start setting you up for Prolia injections.  Call if you want to  move forward with referral for vertebroplasty.  Decrease either trazodone at bedtime or clonazepam if  feeling tired.

## 2020-11-03 NOTE — Progress Notes (Addendum)
Patient ID: Catherine Young, female    DOB: March 03, 1931, 87 y.o.   MRN: [id number]  This visit was conducted in person.  BP (!) 142/74   Pulse (!) 50   Temp (!) 97.2 F (36.2 C) (Temporal)   Ht 5\' 5"  (1.651 m)   Wt 109 lb (49.4 kg)   SpO2 98%   BMI 18.14 kg/m    CC: Chief Complaint  Patient presents with   Back Pain    Subjective:   HPI: Catherine Young is a 87 y.o. female presenting on 11/03/2020 for Back Pain  She saw Dr. Einar Pheasant on 10/20/2020 for flare up of back pain. Note reviewed in detail. In past has always been leg and foot pain chronically but was having NEW back pain.  Non-focal exam.  No proceeding fall/ injury. Pt was unable to sleep at night.. given lidocaine patches  (were too expensive) and short course of hydrocodone Lumbar X-ray showed moderate L1 compression fracture new compared to 02/22/2016, lumbar spondylosis   At last OV on 10/4.Marland Kitchen she was tried on a trial of Neurontin low dose at night for pain.  Constipation was worse on narcotic... treated with mirlax 17 g daily.  Osteoporosis... in past intolerant of bisphosphonate.     Constipation now improved.Marland Kitchen on Miralax. Neurontin is not helping at this dose. Usually able to sleep through the night.  Pain is worse during the day. No pain in legs now, feet still feels   Relevant past medical, surgical, family and social history reviewed and updated as indicated. Interim medical history since our last visit reviewed. Allergies and medications reviewed and updated. Outpatient Medications Prior to Visit  Medication Sig Dispense Refill   amLODipine (NORVASC) 2.5 MG tablet Take 1 tablet (2.5 mg total) by mouth daily. 30 tablet 7   clonazePAM (KLONOPIN) 1 MG tablet TAKE 1/2 TABLET(0.5 MG) BY MOUTH TWICE DAILY AS NEEDED FOR ANXIETY 90 tablet 0   gabapentin (NEURONTIN) 100 MG capsule Take 1 capsule (100 mg total) by mouth at bedtime. 30 capsule 3   sertraline (ZOLOFT) 100 MG tablet TAKE 1 TABLET(100 MG) BY  MOUTH AT BEDTIME 90 tablet 1   traZODone (DESYREL) 100 MG tablet TAKE 1 TABLET(100 MG) BY MOUTH AT BEDTIME AS NEEDED FOR SLEEP 90 tablet 1   No facility-administered medications prior to visit.     Per HPI unless specifically indicated in ROS section below Review of Systems  Constitutional:  Negative for fatigue and fever.  HENT:  Negative for congestion.   Eyes:  Negative for pain.  Respiratory:  Negative for cough and shortness of breath.   Cardiovascular:  Negative for chest pain, palpitations and leg swelling.  Gastrointestinal:  Negative for abdominal pain.  Genitourinary:  Negative for dysuria and vaginal bleeding.  Musculoskeletal:  Negative for back pain.  Neurological:  Negative for syncope, light-headedness and headaches.  Psychiatric/Behavioral:  Negative for dysphoric mood.   Objective:  BP (!) 142/74   Pulse (!) 50   Temp (!) 97.2 F (36.2 C) (Temporal)   Ht 5\' 5"  (1.651 m)   Wt 109 lb (49.4 kg)   SpO2 98%   BMI 18.14 kg/m   Wt Readings from Last 3 Encounters:  11/03/20 109 lb (49.4 kg)  10/27/20 110 lb (49.9 kg)  10/20/20 110 lb 8 oz (50.1 kg)      Physical Exam Constitutional:      General: She is not in acute distress.    Appearance: Normal appearance. She is  well-developed. She is not ill-appearing or toxic-appearing.  HENT:     Head: Normocephalic.     Right Ear: Hearing, tympanic membrane, ear canal and external ear normal. Tympanic membrane is not erythematous, retracted or bulging.     Left Ear: Hearing, tympanic membrane, ear canal and external ear normal. Tympanic membrane is not erythematous, retracted or bulging.     Nose: No mucosal edema or rhinorrhea.     Right Sinus: No maxillary sinus tenderness or frontal sinus tenderness.     Left Sinus: No maxillary sinus tenderness or frontal sinus tenderness.     Mouth/Throat:     Pharynx: Uvula midline.  Eyes:     General: Lids are normal. Lids are everted, no foreign bodies appreciated.      Conjunctiva/sclera: Conjunctivae normal.     Pupils: Pupils are equal, round, and reactive to light.  Neck:     Thyroid: No thyroid mass or thyromegaly.     Vascular: No carotid bruit.     Trachea: Trachea normal.  Cardiovascular:     Rate and Rhythm: Normal rate and regular rhythm.     Pulses: Normal pulses.     Heart sounds: Normal heart sounds, S1 normal and S2 normal. No murmur heard.   No friction rub. No gallop.  Pulmonary:     Effort: Pulmonary effort is normal. No tachypnea or respiratory distress.     Breath sounds: Normal breath sounds. No decreased breath sounds, wheezing, rhonchi or rales.  Abdominal:     General: Bowel sounds are normal.     Palpations: Abdomen is soft.     Tenderness: There is no abdominal tenderness.  Musculoskeletal:     Cervical back: Normal range of motion and neck supple. Deformity present.     Thoracic back: Normal.     Lumbar back: Deformity and bony tenderness present. Decreased range of motion. Negative right straight leg raise test and negative left straight leg raise test.     Comments: Ttp at L1 focally  Kyphosis  Skin:    General: Skin is warm and dry.     Findings: No rash.  Neurological:     Mental Status: She is alert.  Psychiatric:        Mood and Affect: Mood is not anxious or depressed.        Speech: Speech normal.        Behavior: Behavior normal. Behavior is cooperative.        Thought Content: Thought content normal.        Judgment: Judgment normal.      Results for orders placed or performed in visit on 04/10/20  Urine Culture   Specimen: Urine  Result Value Ref Range   MICRO NUMBER: [id number]    SPECIMEN QUALITY: Adequate    Sample Source NOT GIVEN    STATUS: FINAL    Result: No Growth   Vitamin B12  Result Value Ref Range   Vitamin B-12 346 211 - 911 pg/mL  CBC with Differential/Platelet  Result Value Ref Range   WBC 5.1 4.0 - 10.5 K/uL   RBC 3.96 3.87 - 5.11 Mil/uL   Hemoglobin 12.6 12.0 - 15.0 g/dL   HCT  37.9 36.0 - 46.0 %   MCV 95.9 78.0 - 100.0 fl   MCHC 33.3 30.0 - 36.0 g/dL   RDW 13.0 11.5 - 15.5 %   Platelets 219.0 150.0 - 400.0 K/uL   Neutrophils Relative % 61.8 43.0 - 77.0 %   Lymphocytes Relative  27.9 12.0 - 46.0 %   Monocytes Relative 8.5 3.0 - 12.0 %   Eosinophils Relative 1.4 0.0 - 5.0 %   Basophils Relative 0.4 0.0 - 3.0 %   Neutro Abs 3.2 1.4 - 7.7 K/uL   Lymphs Abs 1.4 0.7 - 4.0 K/uL   Monocytes Absolute 0.4 0.1 - 1.0 K/uL   Eosinophils Absolute 0.1 0.0 - 0.7 K/uL   Basophils Absolute 0.0 0.0 - 0.1 K/uL  T3, free  Result Value Ref Range   T3, Free 3.4 2.3 - 4.2 pg/mL  TSH  Result Value Ref Range   TSH 1.43 0.35 - 4.50 uIU/mL  T4, free  Result Value Ref Range   Free T4 0.78 0.60 - 1.60 ng/dL  VITAMIN D 25 Hydroxy (Vit-D Deficiency, Fractures)  Result Value Ref Range   VITD 60.47 30.00 - 100.00 ng/mL  POCT Urinalysis Dipstick (Automated)  Result Value Ref Range   Color, UA Yellow    Clarity, UA Clear    Glucose, UA Negative Negative   Bilirubin, UA Negative    Ketones, UA Negative    Spec Grav, UA 1.015 1.010 - 1.025   Blood, UA Moderate    pH, UA 6.0 5.0 - 8.0   Protein, UA Positive (A) Negative   Urobilinogen, UA 0.2 0.2 or 1.0 E.U./dL   Nitrite, UA Negative    Leukocytes, UA Negative Negative    This visit occurred during the SARS-CoV-2 public health emergency.  Safety protocols were in place, including screening questions prior to the visit, additional usage of staff PPE, and extensive cleaning of exam room while observing appropriate contact time as indicated for disinfecting solutions.   COVID 19 screen:  No recent travel or known exposure to COVID19 The patient denies respiratory symptoms of COVID 19 at this time. The importance of social distancing was discussed today.   Assessment and Plan    Problem List Items Addressed This Visit     Chronic constipation    Resolved with Miralax.      Closed compression fracture of body of L1 vertebra  (HCC)    Acute,  Current pain is at site of new fracture.  Will increase Neurontin to 100 mg TID if tolerated. Can use hydrocodone prn in limited fashion for breakthrough pain.  If severe pain or not improving.. she will consider vertebroplasty consult with IR.      Osteoporosis - Primary    SE to bisphosphonate.  Discussed options.. pt willing to start Prolia q 6 months. Will need labs for Ca and Cr. Lab Results  Component Value Date   CREATININE 1.03 03/24/2020   CREATININE 0.69 11/11/2019   CREATININE 0.80 10/21/2019         Spinal stenosis    Crhonic,   Hopefully Neurontin will help with this as well.      Meds ordered this encounter  Medications   gabapentin (NEURONTIN) 100 MG capsule    Sig: Take 1 capsule (100 mg total) by mouth 3 (three) times daily.    Dispense:  90 capsule    Refill:  3   HYDROcodone-acetaminophen (NORCO/VICODIN) 5-325 MG tablet    Sig: Take 0.5-1 tablets by mouth daily as needed for severe pain.    Dispense:  10 tablet    Refill:  0      Eliezer Lofts, MD

## 2020-11-06 ENCOUNTER — Telehealth: Payer: Self-pay | Admitting: Family Medicine

## 2020-11-06 ENCOUNTER — Other Ambulatory Visit: Payer: Self-pay | Admitting: Family Medicine

## 2020-11-06 DIAGNOSIS — M810 Age-related osteoporosis without current pathological fracture: Secondary | ICD-10-CM

## 2020-11-06 DIAGNOSIS — S32010A Wedge compression fracture of first lumbar vertebra, initial encounter for closed fracture: Secondary | ICD-10-CM

## 2020-11-06 NOTE — Telephone Encounter (Signed)
We are working on setting up prolia and I will make the referral for vertebroplasty. If TID gabapentin is not controlling pain , but no side effects.. she can increase to 100 mg in AM /100 mg  midday/ 200 mg in PM

## 2020-11-06 NOTE — Telephone Encounter (Signed)
Spoke to pt. She would like to go to [place]. She will try increasing the gabapentin.

## 2020-11-06 NOTE — Telephone Encounter (Signed)
Pt called in stated she would like to go forward with care plan . Would like a call back #[phone]

## 2020-11-06 NOTE — Telephone Encounter (Signed)
Spoke to pt. She is still having so much back pain. Would like to move forward with vertebroplasty and Prolia.

## 2020-11-09 ENCOUNTER — Telehealth: Payer: Self-pay

## 2020-11-09 DIAGNOSIS — M810 Age-related osteoporosis without current pathological fracture: Secondary | ICD-10-CM

## 2020-11-09 DIAGNOSIS — Z79899 Other long term (current) drug therapy: Secondary | ICD-10-CM

## 2020-11-09 NOTE — Telephone Encounter (Signed)
Catherine Sanders, MD  Tiffany Kocher V, CMA Please start the process to set this patient up with PROLIA.   Benefits submitted today. New start. Pending

## 2020-11-10 ENCOUNTER — Ambulatory Visit: Payer: Medicare PPO | Admitting: Family Medicine

## 2020-11-11 ENCOUNTER — Telehealth: Payer: Self-pay | Admitting: Family Medicine

## 2020-11-11 DIAGNOSIS — S32010A Wedge compression fracture of first lumbar vertebra, initial encounter for closed fracture: Secondary | ICD-10-CM

## 2020-11-11 NOTE — Telephone Encounter (Signed)
Call   What dose of gabapentin is she on/tolerating at his point? I she taking and of the hydrocodone?   Catherine Young, Please check on the status of the IR referral for vertebropalsty.

## 2020-11-11 NOTE — Telephone Encounter (Signed)
Spoke with Anderson Malta with [facility] [phone] She states that the current order is incorrect. It is currently placed as a Referral to IR and should have been placed as an actual order(imaging).   She looked further into the chart and reviewed the xray results. She stated that the patient is going to need an MRI Lumbar WO before she would be a candidate for IR.  She states that the xray does not give enough information to determine if the patient is eligible for vertebroplasty or kyphoplasty.   She said that if we can get the patient scanned asap (MRI) then they can schedule her fairly quickly if she is determined eligible.   If you need to talk to them about what to order you can give them a call to ensure that the order is placed correctly when needed.

## 2020-11-11 NOTE — Telephone Encounter (Signed)
Pt called and wanted to know what she needed to do or take to help with her back pain  Pt was seen on 11/06/20

## 2020-11-11 NOTE — Telephone Encounter (Signed)
Spoke with [name].  She is currently taking Gabapentin 100 mg 2 capsules in the AM, 1 capsule at lunch and 2 capsules at bedtime.  She has only used one tablet on the hydrocodone because she doesn't want to get addicted to pain meds.  She has been using Tylenol and Advil instead but states she needs some pain relief.

## 2020-11-12 ENCOUNTER — Other Ambulatory Visit: Payer: Self-pay | Admitting: Family Medicine

## 2020-11-12 DIAGNOSIS — S32010A Wedge compression fracture of first lumbar vertebra, initial encounter for closed fracture: Secondary | ICD-10-CM

## 2020-11-12 MED ORDER — HYDROCODONE-ACETAMINOPHEN 5-325 MG PO TABS: 0.5000 | ORAL_TABLET | Freq: Two times a day (BID) | ORAL | 0 refills | Status: DC | PRN

## 2020-11-12 NOTE — Addendum Note (Signed)
Addended by: Carter Kitten on: 11/12/2020 03:12 PM   Modules accepted: Orders

## 2020-11-12 NOTE — Telephone Encounter (Signed)
If tolerating it without sedation she can increase to 2 tabs three times daily of 100 mg gabapentin.  She can use the hydrocodone for pain twice daily, I can provide new prescription for both if needed.( tell her do not worry about addition, just let me know if it all is making her to sedated feeling)    Let her know MRI lumbar spine needed before IR will see her. I will place an order.

## 2020-11-12 NOTE — Telephone Encounter (Signed)
FYI, once MRI complete and if eligible for IR then a new order will need to be placed.  Per IR this has to be an order not a referral to IR

## 2020-11-12 NOTE — Progress Notes (Signed)
If tolerating it without sedation she can increase to 2 tabs three times daily of 100 mg gabapentin.  She can use the hydrocodone for pain twice daily, I can provide new prescription for both if needed.( tell her do not worry about addition, just let me know if it all is making her to sedated feeling)   Let her know MRI lumbar spine need before IR will see her. I will place an order.

## 2020-11-12 NOTE — Telephone Encounter (Signed)
Catherine Young notified as instructed by telephone.  She will increase her Gabapentin to 2 tablets tid and well as start taking the pain medication bid.  She will need a refill on the Hydrocodone.  She is aware to call us at any point she feels these medications are causing over sedation.  She will await call from referrals for MRI appointment. Medication list updated.

## 2020-11-13 ENCOUNTER — Other Ambulatory Visit: Payer: Self-pay

## 2020-11-13 ENCOUNTER — Ambulatory Visit
Admission: RE | Admit: 2020-11-13 | Discharge: 2020-11-13 | Disposition: A | Discharge status: Home / Self Care | Payer: Medicare PPO | Source: Ambulatory Visit | Attending: Family Medicine | Admitting: Family Medicine

## 2020-11-13 DIAGNOSIS — S32010A Wedge compression fracture of first lumbar vertebra, initial encounter for closed fracture: Secondary | ICD-10-CM | POA: Diagnosis not present

## 2020-11-13 DIAGNOSIS — M5126 Other intervertebral disc displacement, lumbar region: Secondary | ICD-10-CM | POA: Diagnosis not present

## 2020-11-13 DIAGNOSIS — M47816 Spondylosis without myelopathy or radiculopathy, lumbar region: Secondary | ICD-10-CM | POA: Diagnosis not present

## 2020-11-13 DIAGNOSIS — M48061 Spinal stenosis, lumbar region without neurogenic claudication: Secondary | ICD-10-CM | POA: Diagnosis not present

## 2020-11-16 ENCOUNTER — Other Ambulatory Visit: Payer: Self-pay | Admitting: Family Medicine

## 2020-11-16 NOTE — Progress Notes (Signed)
Call  MRI shows again the compression fracture, it has progressed some. There is also compression centrally.Marland Kitchen spinal stenosis at lower slevels.   I will work on getting IR for vertebroplasty set up  [place] with [facility] [phone]

## 2020-11-17 ENCOUNTER — Other Ambulatory Visit: Payer: Self-pay | Admitting: Family Medicine

## 2020-11-17 DIAGNOSIS — S32010A Wedge compression fracture of first lumbar vertebra, initial encounter for closed fracture: Secondary | ICD-10-CM

## 2020-11-17 NOTE — Telephone Encounter (Signed)
Pt returned call . Would like a call back [phone]

## 2020-11-17 NOTE — Telephone Encounter (Signed)
Spoke with Ms. Terance Hart and discussed MRI results.  Awaiting call from IR, once MD reviews MRI and determines if vertebroplasty can be done.

## 2020-11-20 NOTE — Telephone Encounter (Signed)
Called IR again and got the voicemail.   Voicemail requested that I not leave multiple messages so I did not leave another message.   Will wait for them to call back  Will make PCP aware that I have attempted to call

## 2020-11-20 NOTE — Telephone Encounter (Signed)
LM for IR to return call  I think they normally call ad schedule these appts  The order is in and ready

## 2020-11-24 ENCOUNTER — Telehealth (HOSPITAL_COMMUNITY): Payer: Self-pay

## 2020-11-24 NOTE — Telephone Encounter (Signed)
Called pt to inform her that we are waiting on insurance authorization. According to the insurance, it is in review and they have until 11/9 to give [place] an answer. Will call pt back as soon as I get authorization. She was fine with this. She said that she received something from [place] a couple days ago that something was approved. She will find the paperwork and call me back. If this is the authorization, I will go ahead and set her up. AW

## 2020-11-24 NOTE — Telephone Encounter (Signed)
From Danielle Dess with IR Scheduling via secure chat in [place] "Hey Winslow Verrill! I got your message. I've tried to call back at the number you left a couple times but it just keeps ringing. I probably wrote the number down wrong. I called Ms. Terance Hart and updated her today. I am waiting to get the procedure approved by her insurance company. That's the hold up. They are giving me the hardest time because they want to know that Dr. Diona Browner has tried conservative management before. I have faxed them notes saying that she has. I have also given them a call to see if I can speak to someone. I had to leave them a voicemail.   They are saying that they have until 11/9 to give an answer. Hopefully I will get somewhere once I speak to someone.     Sending to Dr Diona Browner as Juluis Rainier

## 2020-11-24 NOTE — Telephone Encounter (Signed)
Called IR again, got voicemail.  I did leave a message this time since there has been no call back since last message left 10/28 Requested that they call the patient to schedule or if any questions can call Dr Diona Browner on her cell# (which is in the order) or may reach me at the office number.   Will route to Dr Diona Browner as Juluis Rainier

## 2020-11-24 NOTE — Telephone Encounter (Signed)
Thanks for the update

## 2020-11-25 NOTE — Telephone Encounter (Signed)
Spoke with Joycelyn Schmid and Vira Agar with Prolia Management Department there was no interaction found between the two. I advised patient and for her peace of mind she would like to wait and get Prolia done after vertebroplasty FYI to PCP

## 2020-11-25 NOTE — Telephone Encounter (Signed)
Noted. Sent message to [facility] with Prolia and waiting for response.

## 2020-11-25 NOTE — Telephone Encounter (Signed)
Benefits received. OOP cost is $0. PA approved: dates:11/16/20-01/23/22. Josem Kaufmann #[id number].  Patient advised. Patient advised of the process. Patient is still waiting to get set up for her spine injection-waiting on insurance authorization (see epic notes) patient wanted to make sure if she went ahead and did Prolia before the injection/imaging that it would not mess anything up or delay. Please let me know.

## 2020-11-26 ENCOUNTER — Encounter (HOSPITAL_COMMUNITY): Payer: Self-pay

## 2020-11-26 ENCOUNTER — Telehealth (HOSPITAL_COMMUNITY): Payer: Self-pay

## 2020-11-26 NOTE — Telephone Encounter (Signed)
Peer to peer done by Dr. Debbrah Alar. [place] is requesting to know the patient's pain level on a scale of 1-10. Called patient to confirm. Pain is now a 9/10. Very painful. She is continuing her pain medications as prescribed by Dr. Diona Browner and waiting to be scheduled for her Vertebroplasty.

## 2020-11-27 ENCOUNTER — Telehealth (HOSPITAL_COMMUNITY): Payer: Self-pay

## 2020-11-27 NOTE — Telephone Encounter (Signed)
Dr. Lucianne Lei to do vertebroplasty on 11/7 at 11:30am. AW

## 2020-11-28 ENCOUNTER — Other Ambulatory Visit: Payer: Self-pay | Admitting: Family Medicine

## 2020-11-30 ENCOUNTER — Encounter (HOSPITAL_COMMUNITY): Payer: Self-pay

## 2020-11-30 ENCOUNTER — Ambulatory Visit (HOSPITAL_COMMUNITY)
Admission: RE | Admit: 2020-11-30 | Discharge: 2020-11-30 | Disposition: A | Discharge status: Home / Self Care | Payer: Medicare PPO | Source: Ambulatory Visit | Attending: Family Medicine | Admitting: Family Medicine

## 2020-11-30 ENCOUNTER — Other Ambulatory Visit: Payer: Self-pay | Admitting: Family Medicine

## 2020-11-30 ENCOUNTER — Other Ambulatory Visit: Payer: Self-pay

## 2020-11-30 ENCOUNTER — Other Ambulatory Visit: Payer: Self-pay | Admitting: Radiology

## 2020-11-30 DIAGNOSIS — M48061 Spinal stenosis, lumbar region without neurogenic claudication: Secondary | ICD-10-CM | POA: Diagnosis not present

## 2020-11-30 DIAGNOSIS — M8008XA Age-related osteoporosis with current pathological fracture, vertebra(e), initial encounter for fracture: Secondary | ICD-10-CM | POA: Diagnosis not present

## 2020-11-30 DIAGNOSIS — S32010A Wedge compression fracture of first lumbar vertebra, initial encounter for closed fracture: Secondary | ICD-10-CM

## 2020-11-30 DIAGNOSIS — M4856XA Collapsed vertebra, not elsewhere classified, lumbar region, initial encounter for fracture: Secondary | ICD-10-CM | POA: Insufficient documentation

## 2020-11-30 DIAGNOSIS — Z87891 Personal history of nicotine dependence: Secondary | ICD-10-CM | POA: Insufficient documentation

## 2020-11-30 DIAGNOSIS — Z888 Allergy status to other drugs, medicaments and biological substances status: Secondary | ICD-10-CM | POA: Insufficient documentation

## 2020-11-30 DIAGNOSIS — Z79899 Other long term (current) drug therapy: Secondary | ICD-10-CM | POA: Diagnosis not present

## 2020-11-30 HISTORY — PX: IR KYPHO LUMBAR INC FX REDUCE BONE BX UNI/BIL CANNULATION INC/IMAGING: IMG5519

## 2020-11-30 LAB — BASIC METABOLIC PANEL
Anion gap: 10 (ref 5–15)
BUN: 21 mg/dL (ref 8–23)
CO2: 27 mmol/L (ref 22–32)
Calcium: 9.8 mg/dL (ref 8.9–10.3)
Chloride: 100 mmol/L (ref 98–111)
Creatinine, Ser: 0.98 mg/dL (ref 0.44–1.00)
GFR, Estimated: 56 mL/min — ABNORMAL LOW (ref >60)
Glucose, Bld: 89 mg/dL (ref 70–99)
Potassium: 4 mmol/L (ref 3.5–5.1)
Sodium: 137 mmol/L (ref 135–145)

## 2020-11-30 LAB — CBC
HCT: 41.2 % (ref 36.0–46.0)
Hemoglobin: 13.2 g/dL (ref 12.0–15.0)
MCH: 32 pg (ref 26.0–34.0)
MCHC: 32 g/dL (ref 30.0–36.0)
MCV: 99.8 fL (ref 80.0–100.0)
Platelets: 243 10*3/uL (ref 150–400)
RBC: 4.13 MIL/uL (ref 3.87–5.11)
RDW: 12.5 % (ref 11.5–15.5)
WBC: 6 10*3/uL (ref 4.0–10.5)
nRBC: 0 % (ref 0.0–0.2)

## 2020-11-30 LAB — PROTIME-INR
INR: 1 (ref 0.8–1.2)
Prothrombin Time: 12.8 seconds (ref 11.4–15.2)

## 2020-11-30 MED ORDER — LIDOCAINE HCL (PF) 1 % IJ SOLN
INTRAMUSCULAR | Status: AC | PRN
  Administered 2020-11-30: 20 mL

## 2020-11-30 MED ORDER — MIDAZOLAM HCL 2 MG/2ML IJ SOLN
INTRAMUSCULAR | Status: AC
  Filled 2020-11-30: qty 2

## 2020-11-30 MED ORDER — MIDAZOLAM HCL 2 MG/2ML IJ SOLN
INTRAMUSCULAR | Status: AC | PRN
  Administered 2020-11-30: .5 mg via INTRAVENOUS
  Administered 2020-11-30: 1 mg via INTRAVENOUS
  Administered 2020-11-30 (×2): .5 mg via INTRAVENOUS

## 2020-11-30 MED ORDER — SODIUM CHLORIDE 0.9 % IV SOLN: INTRAVENOUS | Status: DC

## 2020-11-30 MED ORDER — CEFAZOLIN SODIUM-DEXTROSE 2-4 GM/100ML-% IV SOLN
INTRAVENOUS | Status: AC
  Administered 2020-11-30: 2 g via INTRAVENOUS
  Filled 2020-11-30: qty 100

## 2020-11-30 MED ORDER — BUPIVACAINE HCL (PF) 0.5 % IJ SOLN
INTRAMUSCULAR | Status: AC
  Filled 2020-11-30: qty 30

## 2020-11-30 MED ORDER — FENTANYL CITRATE (PF) 100 MCG/2ML IJ SOLN
INTRAMUSCULAR | Status: AC | PRN
  Administered 2020-11-30 (×2): 25 ug via INTRAVENOUS
  Administered 2020-11-30: 50 ug via INTRAVENOUS
  Administered 2020-11-30: 25 ug via INTRAVENOUS

## 2020-11-30 MED ORDER — FENTANYL CITRATE (PF) 100 MCG/2ML IJ SOLN
INTRAMUSCULAR | Status: AC
  Filled 2020-11-30: qty 2

## 2020-11-30 MED ORDER — LIDOCAINE HCL 1 % IJ SOLN
INTRAMUSCULAR | Status: AC
  Filled 2020-11-30: qty 20

## 2020-11-30 MED ORDER — CEFAZOLIN SODIUM-DEXTROSE 2-4 GM/100ML-% IV SOLN: 2.0000 g | INTRAVENOUS | Status: AC

## 2020-11-30 MED ORDER — IOHEXOL 300 MG/ML  SOLN
100.0000 mL | Freq: Once | INTRAMUSCULAR | Status: AC | PRN
  Administered 2020-11-30: 10 mL via INTRAVENOUS

## 2020-11-30 MED ORDER — BUPIVACAINE HCL (PF) 0.5 % IJ SOLN
INTRAMUSCULAR | Status: AC | PRN
  Administered 2020-11-30: 20 mL

## 2020-11-30 NOTE — Procedures (Signed)
INTERVENTIONAL NEURORADIOLOGY BRIEF POSTPROCEDURE NOTE  Fluoroscopy guided L1 core bone biopsy balloon kyphoplasty   Attending: Dr. Pedro Earls  Assistant: None.   Diagnosis: L1 compression fracture   Access site: Percutaneous, bilateral transpedicular  Anesthesia: Moderate sedation   Medication used: 2.5 mg Versed IV; 125 mcg Fentanyl IV.  Complications: None.   Estimated blood loss: Negligible   Specimen: 1 core bone biopsy   Findings: Compression fracture of the L1 vertebral body confirmed.  Core bone biopsy sample was obtained followed by balloon kyphoplasty.   The patient tolerated the procedure well without incident or complication and is in stable condition.

## 2020-11-30 NOTE — H&P (Signed)
Chief Complaint: Patient was seen in consultation today for Lumbar 1 Vertebroplasty/kyphoplasty at the request of [name]  Referring Physician(s): Jinny Sanders  Supervising Physician: Pedro Earls  Patient Status: [facility] - Out-pt  History of Present Illness: Catherine Young is a 87 y.o. female   Increasing low back pain since beginning of October Denies fall or injury She has been unable to rest or get around well at all. Short course of hydrocodone and Lido patches--- not much help  MRI 11/13/20:IMPRESSION: 1. Compression fracture of L1 with approximately 80% vertebral body height loss in the midportion of the vertebral body, which appears to have progressed since 10/20/2020, with increased T2 signal suggesting an acute component. Abnormal signal extends into the left greater than right pedicle. 2. L4-L5 severe spinal canal stenosis, which has progressed from the prior exam. Effacement of the lateral recess at this level may affect the descending L5 nerve roots. 3. L3-L4 mild spinal canal stenosis, unchanged, with mild right neural foraminal narrowing.  Pt has had KP of T7 and T11 2008-- which was great relief MD has sent pt for L1 VP/KP today  Past Medical History:  Diagnosis Date   Arthritis    Basal cell carcinoma of tragus, left    Cataract    right   Complication of anesthesia    COULD'T MOVE WHEN WAKING UP AFTER SECONT COLON SURGERY   Depression    Hypertension    Intestinal obstruction ([place])    Osteoporosis     Past Surgical History:  Procedure Laterality Date   ABDOMINAL HYSTERECTOMY     partial, for menorrhagia   APPENDECTOMY     CATARACT EXTRACTION W/PHACO Right 05/25/2017   Procedure: CATARACT EXTRACTION PHACO AND INTRAOCULAR LENS PLACEMENT ([place]);  Surgeon: Eulogio Bear, MD;  Location: [facility] ORS;  Service: Ophthalmology;  Laterality: Right;  fluid pack lot #  [id number]  exp  12/24/2018 [place]   00:39.4 AP%   9.4 CDE     3.71    COLON SURGERY     INTESTINAL BLOCKAGE FROM SCAR TISSUE X 2    Allergies: Fosamax [alendronate]  Medications: Prior to Admission medications   Medication Sig Start Date End Date Taking? Authorizing Provider  amLODipine (NORVASC) 2.5 MG tablet Take 1 tablet (2.5 mg total) by mouth daily. 05/29/20 10/20/21 Yes Agbor-Etang, Aaron Edelman, MD  clonazePAM (KLONOPIN) 1 MG tablet TAKE 1/2 TABLET(0.5 MG) BY MOUTH TWICE DAILY AS NEEDED FOR ANXIETY 07/30/20  Yes Bedsole, Amy E, MD  gabapentin (NEURONTIN) 100 MG capsule Take 200 mg by mouth 3 (three) times daily.   Yes [provider]  HYDROcodone-acetaminophen (NORCO/VICODIN) 5-325 MG tablet Take 0.5-1 tablets by mouth 2 (two) times daily as needed for moderate pain or severe pain. 11/12/20  Yes Bedsole, Amy E, MD  sertraline (ZOLOFT) 100 MG tablet TAKE 1 TABLET(100 MG) BY MOUTH AT BEDTIME 11/29/20  Yes Bedsole, Amy E, MD  traZODone (DESYREL) 100 MG tablet TAKE 1 TABLET(100 MG) BY MOUTH AT BEDTIME AS NEEDED FOR SLEEP 11/29/20  Yes Jinny Sanders, MD     Family History  Problem Relation Age of Onset   Arthritis Mother    Colon cancer Mother    Cancer Mother        colon   Diabetes Father    Arthritis Sister    Colon cancer Sister    Cancer Sister        colon   Lung cancer Brother    Cancer Brother  lung   Arthritis Sister    Colon cancer Sister    Cancer Sister        colon    Social History   Socioeconomic History   Marital status: Widowed    Spouse name: Not on file   Number of children: Not on file   Years of education: Not on file   Highest education level: Not on file  Occupational History   Not on file  Tobacco Use   Smoking status: Former    Types: Cigarettes    Quit date: 01/24/1993    Years since quitting: 27.8   Smokeless tobacco: Never   Tobacco comments:    quit 1995  Vaping Use   Vaping Use: Never used  Substance and Sexual Activity   Alcohol use: Yes    Alcohol/week: 7.0 standard drinks     Types: 7 Glasses of wine per week    Comment: nightly glass of wine   Drug use: No   Sexual activity: Never  Other Topics Concern   Not on file  Social History Narrative   Not on file   Social Determinants of Health   Financial Resource Strain: Low Risk    Difficulty of Paying Living Expenses: Not hard at all  Food Insecurity: No Food Insecurity   Worried About Charity fundraiser in the Last Year: Never true   [place] in the Last Year: Never true  Transportation Needs: No Transportation Needs   Lack of Transportation (Medical): No   Lack of Transportation (Non-Medical): No  Physical Activity: Inactive   Days of Exercise per Week: 0 days   Minutes of Exercise per Session: 0 min  Stress: No Stress Concern Present   Feeling of Stress : Not at all  Social Connections: Not on file    Review of Systems: A 12 point ROS discussed and pertinent positives are indicated in the HPI above.  All other systems are negative.  Review of Systems  Constitutional:  Positive for activity change. Negative for fever.  Respiratory:  Negative for cough and shortness of breath.   Cardiovascular:  Negative for chest pain.  Gastrointestinal:  Negative for abdominal pain.  Musculoskeletal:  Positive for back pain and gait problem.  Neurological:  Positive for weakness.  Psychiatric/Behavioral:  Negative for behavioral problems and confusion.    Vital Signs: BP (!) 182/87   Pulse 60   Temp 97.8 F (36.6 C) (Oral)   Resp 12   Ht 5\' 5"  (1.651 m)   Wt 110 lb (49.9 kg)   SpO2 96%   BMI 18.30 kg/m   Physical Exam Vitals reviewed.  HENT:     Mouth/Throat:     Mouth: Mucous membranes are moist.  Cardiovascular:     Rate and Rhythm: Normal rate and regular rhythm.     Heart sounds: Normal heart sounds.  Pulmonary:     Effort: Pulmonary effort is normal.     Breath sounds: Normal breath sounds.  Abdominal:     Palpations: Abdomen is soft.  Musculoskeletal:        General: No signs  of injury. Normal range of motion.  Skin:    General: Skin is warm.  Neurological:     Mental Status: She is alert and oriented to person, place, and time.  Psychiatric:        Behavior: Behavior normal.    Imaging: MR Lumbar Spine Wo Contrast  Result Date: 11/15/2020 CLINICAL DATA:  Compression fracture, L-spine  EXAM: MRI LUMBAR SPINE WITHOUT CONTRAST TECHNIQUE: Multiplanar, multisequence MR imaging of the lumbar spine was performed. No intravenous contrast was administered. COMPARISON:  03/03/2016 lumbar spine MRI, correlation is also made with 10/20/2020 lumbar spine radiographs FINDINGS: Segmentation:  Standard. Alignment: Levocurvature of the lumbar spine grade 1 anterolisthesis L3 on L4 and L4 on L5, which have progressed since the prior exam. Vertebrae: Compression fracture of L1, which appears to progressed since 10/20/2020, with increased T2 signal in the posterior aspect of the vertebral body, suggesting an acute component. There is approximately 80% vertebral body height loss in the midportion of the vertebral body and 60% height loss at the anterior aspect. Increased T2 signal extends into left greater than right pedicle. Approximately 3 mm retropulsion the posterior right aspect of the cortex, which does not cause significant spinal canal stenosis. No other vertebral body fracture. No suspicious osseous lesion. Conus medullaris and cauda equina: Conus extends to the L1 level. Conus and cauda equina appear normal. Paraspinal and other soft tissues: Large right renal cyst. Small left renal cysts. Small hepatic cyst. Disc levels: T12-L1: No significant disc bulge. Mild facet arthropathy. No spinal canal stenosis. Right perineural cyst. L1-L2: No significant disc bulge. Moderate facet arthropathy. No spinal canal stenosis or neural foraminal narrowing. L2-L3: Mild disc bulge. Moderate facet arthropathy. No spinal canal stenosis. No neural foraminal narrowing. L3-L4: Anterolisthesis and disc  unroofing with broad-based disc bulge. Moderate to severe facet arthropathy, with a posteriorly directed left facet cyst. Ligamentum flavum hypertrophy. Mild spinal canal stenosis, unchanged from the prior exam. Mild right neural foraminal narrowing. L4-L5: Grade 1 anterolisthesis with broad-based disc bulge. Moderate to severe facet arthropathy. Ligamentum flavum hypertrophy. Severe spinal canal stenosis, which has progressed from the prior exam, with effacement of the lateral recesses. No neural foraminal narrowing. L5-S1: No significant disc bulge. Mild facet arthropathy. No spinal canal stenosis or neural foraminal narrowing. IMPRESSION: 1. Compression fracture of L1 with approximately 80% vertebral body height loss in the midportion of the vertebral body, which appears to have progressed since 10/20/2020, with increased T2 signal suggesting an acute component. Abnormal signal extends into the left greater than right pedicle. 2. L4-L5 severe spinal canal stenosis, which has progressed from the prior exam. Effacement of the lateral recess at this level may affect the descending L5 nerve roots. 3. L3-L4 mild spinal canal stenosis, unchanged, with mild right neural foraminal narrowing. Electronically Signed   By: Merilyn Baba M.D.   On: 11/15/2020 02:24    Labs:  CBC: Recent Labs    03/24/20 1005 04/10/20 1312 11/30/20 1015  WBC 4.9 5.1 6.0  HGB 12.5 12.6 13.2  HCT 37.2 37.9 41.2  PLT 233.0 219.0 243    COAGS: No results for input(s): INR, APTT in the last 8760 hours.  BMP: Recent Labs    03/24/20 1005  NA 136  K 4.2  CL 98  CO2 31  GLUCOSE 94  BUN 17  CALCIUM 9.9  CREATININE 1.03    LIVER FUNCTION TESTS: Recent Labs    03/24/20 1005  BILITOT 0.6  AST 22  ALT 11  ALKPHOS 42  PROT 7.5  ALBUMIN 4.2    TUMOR MARKERS: No results for input(s): AFPTM, CEA, CA199, CHROMGRNA in the last 8760 hours.  Assessment and Plan:  Worsening back pain Lumbar 1 acute compression  fracture Meds without relief Scheduled for Lumbar 1 Vertebroplasty/kyphoplasty Risks and benefits of Lumbar 1 vertebroplasty/kyphoplasty were discussed with the patient including, but not limited to education regarding  the natural healing process of compression fractures without intervention, bleeding, infection, cement migration which may cause spinal cord damage, paralysis, pulmonary embolism or even death.  This interventional procedure involves the use of X-rays and because of the nature of the planned procedure, it is possible that we will have prolonged use of X-ray fluoroscopy.  Potential radiation risks to you include (but are not limited to) the following: - A slightly elevated risk for cancer  several years later in life. This risk is typically less than 0.5% percent. This risk is low in comparison to the normal incidence of human cancer, which is 33% for women and 50% for men according to the [place]. - Radiation induced injury can include skin redness, resembling a rash, tissue breakdown / ulcers and hair loss (which can be temporary or permanent).   The likelihood of either of these occurring depends on the difficulty of the procedure and whether you are sensitive to radiation due to previous procedures, disease, or genetic conditions.   IF your procedure requires a prolonged use of radiation, you will be notified and given written instructions for further action.  It is your responsibility to monitor the irradiated area for the 2 weeks following the procedure and to notify your physician if you are concerned that you have suffered a radiation induced injury.    All of the patient's questions were answered, patient is agreeable to proceed.  Consent signed and in chart.   Thank you for this interesting consult.  I greatly enjoyed meeting DELYLA SANDEEN and look forward to participating in their care.  A copy of this report was sent to the requesting provider on  this date.  Electronically Signed: Lavonia Drafts, PA-C 11/30/2020, 10:44 AM   I spent a total of    25 Minutes in face to face in clinical consultation, greater than 50% of which was counseling/coordinating care for L1 VP/KP

## 2020-11-30 NOTE — Progress Notes (Signed)
Pt ambulated without difficulty or bleeding.   Discharged home with her daughter who will drive and stay with pt x 24 hrs. 

## 2020-12-02 LAB — SURGICAL PATHOLOGY

## 2020-12-08 NOTE — Telephone Encounter (Signed)
Procedure was done, see epic, called patient to follow up and left detailed message-ok per DPR on file, to find out if patient would like to proceed with Prolia now.

## 2021-01-03 ENCOUNTER — Other Ambulatory Visit: Payer: Self-pay | Admitting: Family Medicine

## 2021-01-03 NOTE — Telephone Encounter (Signed)
Last office visit 11/03/20 for back pain.  Last refilled 07/30/20 for #90 with no refills.  No future appointments with PCP.

## 2021-01-04 NOTE — Telephone Encounter (Signed)
Spoke with patient. She would like to wait till January to get started on Prolia. I advised patient I will recheck her benefits at that time and reach back out to her.  Fyi to PCP

## 2021-02-03 ENCOUNTER — Other Ambulatory Visit: Payer: Self-pay | Admitting: *Deleted

## 2021-02-04 NOTE — Telephone Encounter (Signed)
Attempted to schedule.  LMOV to call office.  ° °

## 2021-02-05 MED ORDER — AMLODIPINE BESYLATE 2.5 MG PO TABS: 2.5000 mg | ORAL_TABLET | Freq: Every day | ORAL | 0 refills | Status: DC

## 2021-02-05 NOTE — Telephone Encounter (Signed)
Scheduled

## 2021-02-10 NOTE — Telephone Encounter (Signed)
Benefit verification resubmitted-pending

## 2021-02-17 NOTE — Telephone Encounter (Signed)
OOP cost is $0 (possibly $35) patient understood. Lab appointment on 02/19/21 and Nurse Visit 02/24/21 at [facility] location

## 2021-02-17 NOTE — Addendum Note (Signed)
Addended by: Kris Mouton on: 02/17/2021 03:56 PM   Modules accepted: Orders

## 2021-02-19 ENCOUNTER — Other Ambulatory Visit (INDEPENDENT_AMBULATORY_CARE_PROVIDER_SITE_OTHER): Payer: Medicare PPO

## 2021-02-19 ENCOUNTER — Other Ambulatory Visit: Payer: Self-pay

## 2021-02-19 DIAGNOSIS — M810 Age-related osteoporosis without current pathological fracture: Secondary | ICD-10-CM

## 2021-02-19 NOTE — Addendum Note (Signed)
Addended by: Leeanne Rio on: 02/19/2021 02:33 PM   Modules accepted: Orders

## 2021-02-20 LAB — BASIC METABOLIC PANEL
BUN/Creatinine Ratio: 23 (ref 12–28)
BUN: 24 mg/dL (ref 8–27)
CO2: 27 mmol/L (ref 20–29)
Calcium: 9.5 mg/dL (ref 8.7–10.3)
Chloride: 101 mmol/L (ref 96–106)
Creatinine, Ser: 1.06 mg/dL — ABNORMAL HIGH (ref 0.57–1.00)
Glucose: 67 mg/dL — ABNORMAL LOW (ref 70–99)
Potassium: 4.4 mmol/L (ref 3.5–5.2)
Sodium: 141 mmol/L (ref 134–144)
eGFR: 51 mL/min/{1.73_m2} — ABNORMAL LOW (ref >59)

## 2021-02-24 ENCOUNTER — Ambulatory Visit: Payer: Medicare PPO

## 2021-02-24 NOTE — Addendum Note (Signed)
Addended by: Kris Mouton on: 02/24/2021 09:54 AM   Modules accepted: Orders

## 2021-02-24 NOTE — Telephone Encounter (Signed)
Checked labs and CrCl is 29.45 mL/min. Spoke with Dr Diona Browner verbally and per her orders cancel injection that was scheduled for today and have patient increase her fluid intake and re check labs in 2 weeks. Order placed for BMP. Patient advised. Scheduled for lab appt on 03/10/21.  Dr Diona Browner any other labs you want to be done on that day? I pended BMP order just in case and was not sure which diagnoses you wanted to use for repeat labs. Thank you

## 2021-02-25 NOTE — Addendum Note (Signed)
Addended by: [name] on: 02/25/2021 10:07 AM   Modules accepted: Orders

## 2021-03-10 ENCOUNTER — Other Ambulatory Visit (INDEPENDENT_AMBULATORY_CARE_PROVIDER_SITE_OTHER): Payer: Medicare PPO

## 2021-03-10 ENCOUNTER — Other Ambulatory Visit: Payer: Self-pay

## 2021-03-10 DIAGNOSIS — Z79899 Other long term (current) drug therapy: Secondary | ICD-10-CM

## 2021-03-10 DIAGNOSIS — M810 Age-related osteoporosis without current pathological fracture: Secondary | ICD-10-CM | POA: Diagnosis not present

## 2021-03-11 LAB — BASIC METABOLIC PANEL
BUN: 16 mg/dL (ref 6–23)
CO2: 38 mEq/L — ABNORMAL HIGH (ref 19–32)
Calcium: 9.4 mg/dL (ref 8.4–10.5)
Chloride: 99 mEq/L (ref 96–112)
Creatinine, Ser: 1.38 mg/dL — ABNORMAL HIGH (ref 0.40–1.20)
GFR: 34.44 mL/min — ABNORMAL LOW (ref >60.00)
Glucose, Bld: 89 mg/dL (ref 70–99)
Potassium: 4.1 mEq/L (ref 3.5–5.1)
Sodium: 139 mEq/L (ref 135–145)

## 2021-03-12 ENCOUNTER — Other Ambulatory Visit: Payer: Self-pay | Admitting: *Deleted

## 2021-03-17 NOTE — Telephone Encounter (Signed)
See lab results, kidney levels have decreased, Prolia can not be used per Dr Diona Browner.

## 2021-03-22 ENCOUNTER — Ambulatory Visit: Payer: Medicare PPO | Admitting: Cardiology

## 2021-03-23 ENCOUNTER — Encounter: Payer: Self-pay | Admitting: Family Medicine

## 2021-03-23 ENCOUNTER — Ambulatory Visit: Payer: Medicare PPO | Admitting: Family Medicine

## 2021-03-23 ENCOUNTER — Other Ambulatory Visit: Payer: Self-pay

## 2021-03-23 VITALS — BP 138/82 | HR 50 | Temp 98.1°F | Resp 12 | Ht 65.0 in | Wt 113.8 lb

## 2021-03-23 DIAGNOSIS — I10 Essential (primary) hypertension: Secondary | ICD-10-CM | POA: Diagnosis not present

## 2021-03-23 DIAGNOSIS — R944 Abnormal results of kidney function studies: Secondary | ICD-10-CM

## 2021-03-23 DIAGNOSIS — H00012 Hordeolum externum right lower eyelid: Secondary | ICD-10-CM | POA: Diagnosis not present

## 2021-03-23 DIAGNOSIS — R197 Diarrhea, unspecified: Secondary | ICD-10-CM | POA: Diagnosis not present

## 2021-03-23 MED ORDER — AMLODIPINE BESYLATE 2.5 MG PO TABS: 2.5000 mg | ORAL_TABLET | Freq: Every day | ORAL | 3 refills | Status: DC

## 2021-03-23 MED ORDER — ERYTHROMYCIN 5 MG/GM OP OINT: 1.0000 "application " | TOPICAL_OINTMENT | Freq: Every day | OPHTHALMIC | 0 refills | Status: DC

## 2021-03-23 NOTE — Assessment & Plan Note (Signed)
No clear cause other than worsening diarrhea and likely mild dehydration./// treat diarrhea and hydrate. Re-eval today.

## 2021-03-23 NOTE — Assessment & Plan Note (Signed)
She has had worsening dirrehea ( usually alternates to constipation ) in the last few months.  Encouraged fiber and water. Start FODMAP diet for possible IBS  If not improving consider further testing.

## 2021-03-23 NOTE — Assessment & Plan Note (Signed)
Treat with warm compresses and apply topical antibiotic ointment.

## 2021-03-23 NOTE — Assessment & Plan Note (Signed)
Stable, chronic.  Continue current medication.   Amlodipine 2.5 mg daily... may consider change to ACEI /ARB if renal decline is persistent.

## 2021-03-23 NOTE — Patient Instructions (Addendum)
Work on low FODMAP diet.  Start metamucil or benefiber daily for fiber supplement to healp with regularity.  Work on increasing water intake to avoid dehydration.  Apply antibiotic to stye.

## 2021-03-23 NOTE — Progress Notes (Unsigned)
Patient ID: Catherine Young, female    DOB: 25-Aug-1931, 88 y.o.   MRN: [id number]  This visit was conducted in person.  BP 138/82 (BP Location: Left Arm, Patient Position: Sitting, Cuff Size: Normal)    Pulse (!) 50    Temp 98.1 F (36.7 C) (Oral)    Resp 12    Ht 5\' 5"  (1.651 m)    Wt 113 lb 12.8 oz (51.6 kg)    SpO2 98%    BMI 18.94 kg/m    CC:  Chief Complaint  Patient presents with   Follow-up    Discuss GFR. Discuss about still continuing to take Amlodipine she is on 1 pill left.     Subjective:   HPI: Catherine Young is a 88 y.o. female presenting on 03/23/2021 for Follow-up (Discuss GFR. Discuss about still continuing to take Amlodipine she is on 1 pill left. )   Decreased renal function noted in routine eval for use of Prolia.   11/2020 Cr 0.98 GFR   56 02/19/2021 Cr  1.06 GFR 51 03/10/2021 Cr 1.39  GFR 34  No new medication or supplements in last  several months She alternates from diarrhea to constipation. She feels that this has been worse in the last 2 months.  She has 32 oz water per day. She forgets to drink.  She is using miralax only when  She wears pads for urinary incontinence.   Rare NSAIDS, use tylenol.   Blood pressure controlled on amlodipine 2.5 mg daily.    Right eye.. stye x 3-4 das. Using warm compresess, some discharge, mild pain  BP Readings from Last 3 Encounters:  03/23/21 138/82  11/30/20 (!) 141/64  11/03/20 (!) 142/74      Relevant past medical, surgical, family and social history reviewed and updated as indicated. Interim medical history since our last visit reviewed. Allergies and medications reviewed and updated. Outpatient Medications Prior to Visit  Medication Sig Dispense Refill   amLODipine (NORVASC) 2.5 MG tablet Take 1 tablet (2.5 mg total) by mouth daily. 30 tablet 0   clonazePAM (KLONOPIN) 1 MG tablet TAKE 1/2 TABLET(0.5 MG) BY MOUTH TWICE DAILY AS NEEDED FOR ANXIETY 90 tablet 0   sertraline (ZOLOFT) 100 MG tablet  TAKE 1 TABLET(100 MG) BY MOUTH AT BEDTIME 90 tablet 1   traZODone (DESYREL) 100 MG tablet TAKE 1 TABLET(100 MG) BY MOUTH AT BEDTIME AS NEEDED FOR SLEEP 90 tablet 1   gabapentin (NEURONTIN) 100 MG capsule Take 200 mg by mouth 3 (three) times daily. (Patient not taking: Reported on 03/23/2021)     HYDROcodone-acetaminophen (NORCO/VICODIN) 5-325 MG tablet Take 0.5-1 tablets by mouth 2 (two) times daily as needed for moderate pain or severe pain. (Patient not taking: Reported on 03/23/2021) 60 tablet 0   No facility-administered medications prior to visit.     Per HPI unless specifically indicated in ROS section below Review of Systems  Constitutional:  Negative for fatigue and fever.  HENT:  Negative for ear pain.   Eyes:  Negative for pain.  Respiratory:  Negative for chest tightness and shortness of breath.   Cardiovascular:  Negative for chest pain, palpitations and leg swelling.  Gastrointestinal:  Positive for constipation and diarrhea. Negative for abdominal pain.  Genitourinary:  Negative for dysuria.  Objective:  BP 138/82 (BP Location: Left Arm, Patient Position: Sitting, Cuff Size: Normal)    Pulse (!) 50    Temp 98.1 F (36.7 C) (Oral)    Resp 12  Ht 5\' 5"  (1.651 m)    Wt 113 lb 12.8 oz (51.6 kg)    SpO2 98%    BMI 18.94 kg/m   Wt Readings from Last 3 Encounters:  03/23/21 113 lb 12.8 oz (51.6 kg)  11/30/20 110 lb (49.9 kg)  11/03/20 109 lb (49.4 kg)      Physical Exam Constitutional:      General: She is not in acute distress.    Appearance: Normal appearance. She is well-developed. She is not ill-appearing or toxic-appearing.  HENT:     Head: Normocephalic.     Right Ear: Hearing, tympanic membrane, ear canal and external ear normal. Tympanic membrane is not erythematous, retracted or bulging.     Left Ear: Hearing, tympanic membrane, ear canal and external ear normal. Tympanic membrane is not erythematous, retracted or bulging.     Nose: No mucosal edema or rhinorrhea.      Right Sinus: No maxillary sinus tenderness or frontal sinus tenderness.     Left Sinus: No maxillary sinus tenderness or frontal sinus tenderness.     Mouth/Throat:     Pharynx: Uvula midline.  Eyes:     General: Lids are normal. Lids are everted, no foreign bodies appreciated.        Right eye: Hordeolum present.     Conjunctiva/sclera: Conjunctivae normal.     Pupils: Pupils are equal, round, and reactive to light.   Neck:     Thyroid: No thyroid mass or thyromegaly.     Vascular: No carotid bruit.     Trachea: Trachea normal.  Cardiovascular:     Rate and Rhythm: Normal rate and regular rhythm.     Pulses: Normal pulses.     Heart sounds: Normal heart sounds, S1 normal and S2 normal. No murmur heard.   No friction rub. No gallop.  Pulmonary:     Effort: Pulmonary effort is normal. No tachypnea or respiratory distress.     Breath sounds: Normal breath sounds. No decreased breath sounds, wheezing, rhonchi or rales.  Abdominal:     General: Bowel sounds are normal.     Palpations: Abdomen is soft.     Tenderness: There is no abdominal tenderness.  Musculoskeletal:     Cervical back: Normal range of motion and neck supple.  Skin:    General: Skin is warm and dry.     Findings: No rash.  Neurological:     Mental Status: She is alert.  Psychiatric:        Mood and Affect: Mood is not anxious or depressed.        Speech: Speech normal.        Behavior: Behavior normal. Behavior is cooperative.        Thought Content: Thought content normal.        Judgment: Judgment normal.      Results for orders placed or performed in visit on 66/59/93  Basic Metabolic Panel  Result Value Ref Range   Sodium 139 135 - 145 mEq/L   Potassium 4.1 3.5 - 5.1 mEq/L   Chloride 99 96 - 112 mEq/L   CO2 38 (H) 19 - 32 mEq/L   Glucose, Bld 89 70 - 99 mg/dL   BUN 16 6 - 23 mg/dL   Creatinine, Ser 1.38 (H) 0.40 - 1.20 mg/dL   GFR 34.44 (L) >60.00 mL/min   Calcium 9.4 8.4 - 10.5 mg/dL     This visit occurred during the SARS-CoV-2 public health emergency.  Safety protocols were  in place, including screening questions prior to the visit, additional usage of staff PPE, and extensive cleaning of exam room while observing appropriate contact time as indicated for disinfecting solutions.   COVID 19 screen:  No recent travel or known exposure to COVID19 The patient denies respiratory symptoms of COVID 19 at this time. The importance of social distancing was discussed today.   Assessment and Plan    Problem List Items Addressed This Visit     Decreased glomerular filtration rate (GFR)    No clear cause other than worsening diarrhea and likely mild dehydration./// treat diarrhea and hydrate. Re-eval today.      Relevant Orders   Renal Function Panel   Diarrhea    She has had worsening dirrehea ( usually alternates to constipation ) in the last few months.  Encouraged fiber and water. Start FODMAP diet for possible IBS  If not improving consider further testing.      Hordeolum externum of right lower eyelid    Treat with warm compresses and apply topical antibiotic ointment.      Primary hypertension - Primary    Stable, chronic.  Continue current medication.   Amlodipine 2.5 mg daily... may consider change to ACEI /ARB if renal decline is persistent.      Relevant Medications   amLODipine (NORVASC) 2.5 MG tablet   Meds ordered this encounter  Medications   amLODipine (NORVASC) 2.5 MG tablet    Sig: Take 1 tablet (2.5 mg total) by mouth daily.    Dispense:  90 tablet    Refill:  3   erythromycin ophthalmic ointment    Sig: Place 1 application into the right eye at bedtime.    Dispense:  3.5 g    Refill:  0      Eliezer Lofts, MD

## 2021-03-24 LAB — RENAL FUNCTION PANEL
Albumin: 4.4 g/dL (ref 3.5–5.2)
BUN: 22 mg/dL (ref 6–23)
CO2: 33 mEq/L — ABNORMAL HIGH (ref 19–32)
Calcium: 9.4 mg/dL (ref 8.4–10.5)
Chloride: 100 mEq/L (ref 96–112)
Creatinine, Ser: 0.97 mg/dL (ref 0.40–1.20)
GFR: 52.57 mL/min — ABNORMAL LOW (ref >60.00)
Glucose, Bld: 71 mg/dL (ref 70–99)
Phosphorus: 4.2 mg/dL (ref 2.3–4.6)
Potassium: 4.2 mEq/L (ref 3.5–5.1)
Sodium: 139 mEq/L (ref 135–145)

## 2021-03-25 NOTE — Telephone Encounter (Signed)
Labs re done and it is ok to proceed with Prolia at this time ?CrCl is 33.28 mL/min and calcium normal 9.4 on 03/23/21. ?OOP cost is $0 (possibly $35)  ? ?Patient advised. Patient is just not sure if she wants to proceed with this injection. She worries if she gets issues from this injection, feels like if there are possibilities of having an issue from this it would be her, she is worried and would like to talk to her children about this and follow up in about 2 weeks. ? ?I advised patient of Dr Rometta Emery comments and about the injection and I did not want her to feel uncomfortable with doing something she is not ok with this. FYI to Dr Diona Browner ?

## 2021-03-25 NOTE — Telephone Encounter (Signed)
Noted  

## 2021-04-01 NOTE — Telephone Encounter (Signed)
LMOV  

## 2021-04-02 ENCOUNTER — Ambulatory Visit: Payer: Medicare PPO

## 2021-04-12 NOTE — Telephone Encounter (Signed)
Spoke with patient. She will go ahead and proceed with Prolia injection. Scheduled for 04/20/21 due to the schedule. ?

## 2021-04-20 ENCOUNTER — Other Ambulatory Visit: Payer: Self-pay

## 2021-04-20 ENCOUNTER — Ambulatory Visit (INDEPENDENT_AMBULATORY_CARE_PROVIDER_SITE_OTHER): Payer: Medicare PPO

## 2021-04-20 DIAGNOSIS — M810 Age-related osteoporosis without current pathological fracture: Secondary | ICD-10-CM | POA: Diagnosis not present

## 2021-04-20 MED ORDER — DENOSUMAB 60 MG/ML ~~LOC~~ SOSY
60.0000 mg | PREFILLED_SYRINGE | Freq: Once | SUBCUTANEOUS | Status: AC
  Administered 2021-04-20: 60 mg via SUBCUTANEOUS

## 2021-04-20 NOTE — Progress Notes (Signed)
Pt came in today to receive 1 st vit b12 injection. Pt was given Prolia 70mg/ml injection in right srm  successfully w/o any concerns. Pt has been scheduled for follow up injections.  ?

## 2021-05-26 NOTE — Telephone Encounter (Signed)
Attempted to schedule.  

## 2021-05-27 ENCOUNTER — Encounter: Payer: Self-pay | Admitting: *Deleted

## 2021-05-27 ENCOUNTER — Other Ambulatory Visit: Payer: Self-pay | Admitting: Family Medicine

## 2021-06-01 ENCOUNTER — Other Ambulatory Visit: Payer: Self-pay | Admitting: Family Medicine

## 2021-06-01 NOTE — Telephone Encounter (Signed)
Last office visit 03/23/21 for discuss GFR and continuing amlodipine.  Last refilled 01/04/21 for #90 with no refills.  No future appointments with PCP.   ?

## 2021-06-08 ENCOUNTER — Telehealth: Payer: Self-pay | Admitting: Cardiology

## 2021-06-08 NOTE — Telephone Encounter (Signed)
Patient declined to schedule - no longer needed ?Deleted recall ?

## 2021-06-09 NOTE — Telephone Encounter (Signed)
Noted  

## 2021-06-15 ENCOUNTER — Telehealth: Payer: Self-pay | Admitting: Family Medicine

## 2021-06-15 NOTE — Telephone Encounter (Signed)
Left message for patient to call back and schedule Medicare Annual Wellness Visit (AWV) either virtually or phone   Last AWV  04/01/20 ; please schedule at anytime with health coach    I left my direct # [phone]

## 2021-06-22 ENCOUNTER — Ambulatory Visit (INDEPENDENT_AMBULATORY_CARE_PROVIDER_SITE_OTHER): Payer: Medicare PPO

## 2021-06-22 VITALS — Ht 65.0 in | Wt 115.0 lb

## 2021-06-22 DIAGNOSIS — Z0000 Encounter for general adult medical examination without abnormal findings: Secondary | ICD-10-CM

## 2021-06-22 NOTE — Progress Notes (Signed)
I connected with Catherine Young today by telephone and verified that I am speaking with the correct person using two identifiers. Location patient: home Location provider: work Persons participating in the virtual visit: Catherine Young, Glenna Durand LPN.   I discussed the limitations, risks, security and privacy concerns of performing an evaluation and management service by telephone and the availability of in person appointments. I also discussed with the patient that there may be a patient responsible charge related to this service. The patient expressed understanding and verbally consented to this telephonic visit.    Interactive audio and video telecommunications were attempted between this provider and patient, however failed, due to patient having technical difficulties OR patient did not have access to video capability.  We continued and completed visit with audio only.     Vital signs may be patient reported or missing.  Subjective:   Catherine Young is a 88 y.o. female who presents for Medicare Annual (Subsequent) preventive examination.  Review of Systems     Cardiac Risk Factors include: advanced age (>1mn, >>44women);hypertension     Objective:    Today's Vitals   06/22/21 1056 06/22/21 1057  Weight: 115 lb (52.2 kg)   Height: '5\' 5"'$  (1.651 m)   PainSc:  7    Body mass index is 19.14 kg/m.     06/22/2021   11:05 AM 11/30/2020    9:53 AM 04/01/2020    9:00 AM 11/11/2019    3:23 PM 10/21/2019    3:22 PM 12/18/2018   10:45 AM 12/13/2017   11:07 AM  Advanced Directives  Does Patient Have a Medical Advance Directive? Yes Yes Yes No No Yes Yes  Type of AParamed[place]of [place] will [place] will [place] will   [place] will [place] will  Copy of [place] Chart? No - copy requested  No - copy requested    No - copy requested No - copy requested  Would patient like information on creating a medical advance directive?    No - Patient declined       Current Medications (verified) Outpatient Encounter Medications as of 06/22/2021  Medication Sig   clonazePAM (KLONOPIN) 1 MG tablet TAKE 1/2 TABLET(0.5 MG) BY MOUTH TWICE DAILY AS NEEDED FOR ANXIETY   sertraline (ZOLOFT) 100 MG tablet TAKE 1 TABLET(100 MG) BY MOUTH AT BEDTIME   traZODone (DESYREL) 100 MG tablet TAKE 1 TABLET(100 MG) BY MOUTH AT BEDTIME AS NEEDED FOR SLEEP   amLODipine (NORVASC) 2.5 MG tablet Take 1 tablet (2.5 mg total) by mouth daily.   erythromycin ophthalmic ointment Place 1 application into the right eye at bedtime. (Patient not taking: Reported on 06/22/2021)   No facility-administered encounter medications on file as of 06/22/2021.    Allergies (verified) Fosamax [alendronate]   History: Past Medical History:  Diagnosis Date   Arthritis    Basal cell carcinoma of tragus, left    Cataract    right   Complication of anesthesia    COULD'T MOVE WHEN WAKING UP AFTER SECONT COLON SURGERY   Depression    Hypertension    Intestinal obstruction ([place]    Osteoporosis    Past Surgical History:  Procedure Laterality Date   ABDOMINAL HYSTERECTOMY     partial, for menorrhagia   APPENDECTOMY     CATARACT EXTRACTION W/PHACO Right 05/25/2017   Procedure: CATARACT EXTRACTION PHACO AND INTRAOCULAR LENS PLACEMENT ([place];  Surgeon: Eulogio Bear, MD;  Location: [facility] ORS;  Service: Ophthalmology;  Laterality: Right;  fluid pack lot #  [id number]  exp  12/24/2018 [place]   00:39.4 AP%   9.4 CDE    3.71    COLON SURGERY     INTESTINAL BLOCKAGE FROM SCAR TISSUE X 2   IR KYPHO LUMBAR INC FX REDUCE BONE BX UNI/BIL CANNULATION INC/IMAGING  11/30/2020   Family History  Problem Relation Age of Onset   Arthritis Mother    Colon cancer Mother    Cancer Mother        colon   Diabetes Father    Arthritis Sister    Colon cancer Sister     Cancer Sister        colon   Lung cancer Brother    Cancer Brother        lung   Arthritis Sister    Colon cancer Sister    Cancer Sister        colon   Social History   Socioeconomic History   Marital status: Widowed    Spouse name: Not on file   Number of children: Not on file   Years of education: Not on file   Highest education level: Not on file  Occupational History   Not on file  Tobacco Use   Smoking status: Former    Types: Cigarettes    Quit date: 01/24/1993    Years since quitting: 28.4   Smokeless tobacco: Never   Tobacco comments:    quit 1995  Vaping Use   Vaping Use: Never used  Substance and Sexual Activity   Alcohol use: Yes    Alcohol/week: 7.0 standard drinks    Types: 7 Glasses of wine per week    Comment: nightly glass of wine   Drug use: No   Sexual activity: Never  Other Topics Concern   Not on file  Social History Narrative   Not on file   Social Determinants of Health   Financial Resource Strain: Low Risk    Difficulty of Paying Living Expenses: Not hard at all  Food Insecurity: No Food Insecurity   Worried About Charity fundraiser in the Last Year: Never true   [place] in the Last Year: Never true  Transportation Needs: No Transportation Needs   Lack of Transportation (Medical): No   Lack of Transportation (Non-Medical): No  Physical Activity: Inactive   Days of Exercise per Week: 0 days   Minutes of Exercise per Session: 0 min  Stress: No Stress Concern Present   Feeling of Stress : Not at all  Social Connections: Not on file    Tobacco Counseling Counseling given: Not Answered Tobacco comments: quit 1995   Clinical Intake:  Pre-visit preparation completed: Yes  Pain : 0-10 Pain Score: 7  Pain Type: Chronic pain Pain Location:  (spine) Pain Radiating Towards: down both legs Pain Descriptors / Indicators: Shooting, Aching Pain Onset: More than a month ago Pain Frequency: Constant     Nutritional Status:  BMI of 19-24  Normal Nutritional Risks: Nausea/ vomitting/ diarrhea (lots of diarrhea) Diabetes: No  How often do you need to have someone help you when you read instructions, pamphlets, or other written materials from your doctor or pharmacy?: 1 - Never  Diabetic? no  Interpreter Needed?: No  Information entered by :: NAllen LPN   Activities of Daily Living    06/22/2021   11:06 AM  In your present  state of health, do you have any difficulty performing the following activities:  Hearing? 0  Vision? 0  Difficulty concentrating or making decisions? 1  Walking or climbing stairs? 0  Dressing or bathing? 0  Doing errands, shopping? 1  Preparing Food and eating ? N  Using the Toilet? N  In the past six months, have you accidently leaked urine? Y  Do you have problems with loss of bowel control? Y  Managing your Medications? N  Managing your Finances? N  Housekeeping or managing your Housekeeping? N    Patient Care Team: Jinny Sanders, MD as PCP - General (Family Medicine) Eulogio Bear, MD as Consulting Physician (Ophthalmology)  Indicate any recent Medical Services you may have received from other than [facility] providers in the past year (date may be approximate).     Assessment:   This is a routine wellness examination for [facility].  Hearing/Vision screen Vision Screening - Comments:: Regular eye exams, [facility]  Dietary issues and exercise activities discussed: Current Exercise Habits: The patient does not participate in regular exercise at present   Goals Addressed             This Visit's Progress    Patient Stated       06/22/2021, no goals       Depression Screen    06/22/2021   11:06 AM 11/03/2020   12:16 PM 04/01/2020    9:03 AM 04/30/2019   12:17 PM 01/24/2019   12:12 PM 12/18/2018   10:50 AM 07/31/2018   11:22 AM  PHQ 2/9 Scores  PHQ - 2 Score 0 0 0 '2 4 6 4  '$ PHQ- 9 Score  0 0 '13 11 9 9    '$ Fall Risk    06/22/2021   11:05 AM  03/23/2021    2:57 PM 04/01/2020    9:02 AM 12/18/2018   10:46 AM 12/13/2017   11:06 AM  Fall Risk   Falls in the past year? 0 0 1 0 0  Number falls in past yr: 0 0 1 0   Injury with Fall? 0 0 0 0   Risk for fall due to : Medication side effect No Fall Risks Medication side effect;Impaired balance/gait;Impaired mobility;History of fall(s) Impaired balance/gait;Impaired mobility;Medication side effect   Follow up Falls evaluation completed;Education provided;Falls prevention discussed Falls evaluation completed Falls evaluation completed;Falls prevention discussed Falls evaluation completed;Falls prevention discussed     FALL RISK PREVENTION PERTAINING TO THE HOME:  Any stairs in or around the home? Yes  If so, are there any without handrails? No  Home free of loose throw rugs in walkways, pet beds, electrical cords, etc? Yes  Adequate lighting in your home to reduce risk of falls? Yes   ASSISTIVE DEVICES UTILIZED TO PREVENT FALLS:  Life alert? No  Use of a cane, walker or w/c? Yes  Grab bars in the bathroom? Yes  Shower chair or bench in shower? No  Elevated toilet seat or a handicapped toilet? No   TIMED UP AND GO:  Was the test performed? No .       Cognitive Function:    04/01/2020    9:10 AM 12/18/2018   10:58 AM 12/13/2017    1:57 PM 11/22/2016   11:10 AM  MMSE - Mini Mental State Exam  Orientation to time '5 5 5 5  '$ Orientation to Place '5 5 5 5  '$ Registration '3 3 3 3  '$ Attention/ Calculation 5 5 0 0  Recall 3  $'3 3 3  'w$ Language- name 2 objects   0 0  Language- repeat '1 1 1 1  '$ Language- follow 3 step command   3 2  Language- follow 3 step command-comments    unable to follow 1 step of 3 step command  Language- read & follow direction   0 0  Write a sentence   0 0  Copy design   0 0  Total score   20 19        06/22/2021   11:08 AM  6CIT Screen  What Year? 0 points  What month? 0 points  What time? 0 points  Count back from 20 0 points  Months in reverse 0  points  Repeat phrase 0 points  Total Score 0 points    Immunizations Immunization History  Administered Date(s) Administered   Fluad Quad(high Dose 65+) 10/23/2018, 10/25/2019   Influenza,inj,quad, With Preservative 11/10/2016   Influenza-Unspecified 11/17/2016, 10/24/2017   Moderna Sars-Covid-2 Vaccination 02/08/2019, 05/14/2019, 11/13/2019   PFIZER(Purple Top)SARS-COV-2 Vaccination 08/05/2020   Pneumococcal Conjugate-13 10/25/2011, 11/22/2016   Pneumococcal Polysaccharide-23 12/13/2017    TDAP status: Due, Education has been provided regarding the importance of this vaccine. Advised may receive this vaccine at local pharmacy or Health Dept. Aware to provide a copy of the vaccination record if obtained from local pharmacy or Health Dept. Verbalized acceptance and understanding.  Flu Vaccine status: Up to date  Pneumococcal vaccine status: Up to date  Covid-19 vaccine status: Completed vaccines  Qualifies for Shingles Vaccine? Yes   Zostavax completed No   Shingrix Completed?: No.    Education has been provided regarding the importance of this vaccine. Patient has been advised to call insurance company to determine out of pocket expense if they have not yet received this vaccine. Advised may also receive vaccine at local pharmacy or Health Dept. Verbalized acceptance and understanding.  Screening Tests Health Maintenance  Topic Date Due   Zoster Vaccines- Shingrix (1 of 2) Never done   COVID-19 Vaccine (5 - Booster for Moderna series) 09/30/2020   TETANUS/TDAP  11/23/2026 (Originally 10/17/1950)   INFLUENZA VACCINE  08/24/2021   Pneumonia Vaccine 40+ Years old  Completed   DEXA SCAN  Completed   HPV VACCINES  Aged Out    Health Maintenance  Health Maintenance Due  Topic Date Due   Zoster Vaccines- Shingrix (1 of 2) Never done   COVID-19 Vaccine (5 - Booster for Moderna series) 09/30/2020    Colorectal cancer screening: No longer required.   Mammogram status: No  longer required due to age.  Bone Density status: Completed 12/20/2016.   Lung Cancer Screening: (Low Dose CT Chest recommended if Age 4-80 years, 30 pack-year currently smoking OR have quit w/in 15years.) does not qualify.   Lung Cancer Screening Referral: no  Additional Screening:  Hepatitis C Screening: does not qualify;   Vision Screening: Recommended annual ophthalmology exams for early detection of glaucoma and other disorders of the eye. Is the patient up to date with their annual eye exam?  Yes  Who is the provider or what is the name of the office in which the patient attends annual eye exams? [facility] If pt is not established with a provider, would they like to be referred to a provider to establish care? No .   Dental Screening: Recommended annual dental exams for proper oral hygiene  Community Resource Referral / Chronic Care Management: CRR required this visit?  No   CCM required this visit?  No      Plan:     I have personally reviewed and noted the following in the patient's chart:   Medical and social history Use of alcohol, tobacco or illicit drugs  Current medications and supplements including opioid prescriptions.  Functional ability and status Nutritional status Physical activity Advanced directives List of other physicians Hospitalizations, surgeries, and ER visits in previous 12 months Vitals Screenings to include cognitive, depression, and falls Referrals and appointments  In addition, I have reviewed and discussed with patient certain preventive protocols, quality metrics, and best practice recommendations. A written personalized care plan for preventive services as well as general preventive health recommendations were provided to patient.     Kellie Simmering, LPN   [id number]   Nurse Notes: none  Due to this being a virtual visit, the after visit summary with patients personalized plan was offered to patient via mail or  my-chart. Patient would like to access on my-chart

## 2021-06-22 NOTE — Patient Instructions (Signed)
[name] , Thank you for taking time to come for your Medicare Wellness Visit. I appreciate your ongoing commitment to your health goals. Please review the following plan we discussed and let me know if I can assist you in the future.   Screening recommendations/referrals: Colonoscopy: not required Mammogram: not required Bone Density: completed 12/20/2016 Recommended yearly ophthalmology/optometry visit for glaucoma screening and checkup Recommended yearly dental visit for hygiene and checkup  Vaccinations: Influenza vaccine: due 08/24/2021 Pneumococcal vaccine: completed 12/13/2017 Tdap vaccine: due Shingles vaccine: discussed   Covid-19: 08/05/2020, 11/13/2019, 05/14/2019, 02/08/2019  Advanced directives: Please bring a copy of your POA (Power of Attorney) and/or Living Will to your next appointment.   Conditions/risks identified: none  Next appointment: Follow up in one year for your annual wellness visit    Preventive Care 65 Years and Older, Female Preventive care refers to lifestyle choices and visits with your health care provider that can promote health and wellness. What does preventive care include? A yearly physical exam. This is also called an annual well check. Dental exams once or twice a year. Routine eye exams. Ask your health care provider how often you should have your eyes checked. Personal lifestyle choices, including: Daily care of your teeth and gums. Regular physical activity. Eating a healthy diet. Avoiding tobacco and drug use. Limiting alcohol use. Practicing safe sex. Taking low-dose aspirin every day. Taking vitamin and mineral supplements as recommended by your health care provider. What happens during an annual well check? The services and screenings done by your health care provider during your annual well check will depend on your age, overall health, lifestyle risk factors, and family history of disease. Counseling  Your health care provider  may ask you questions about your: Alcohol use. Tobacco use. Drug use. Emotional well-being. Home and relationship well-being. Sexual activity. Eating habits. History of falls. Memory and ability to understand (cognition). Work and work Statistician. Reproductive health. Screening  You may have the following tests or measurements: Height, weight, and BMI. Blood pressure. Lipid and cholesterol levels. These may be checked every 5 years, or more frequently if you are over 84 years old. Skin check. Lung cancer screening. You may have this screening every year starting at age 44 if you have a 30-pack-year history of smoking and currently smoke or have quit within the past 15 years. Fecal occult blood test (FOBT) of the stool. You may have this test every year starting at age 42. Flexible sigmoidoscopy or colonoscopy. You may have a sigmoidoscopy every 5 years or a colonoscopy every 10 years starting at age 52. Hepatitis C blood test. Hepatitis B blood test. Sexually transmitted disease (STD) testing. Diabetes screening. This is done by checking your blood sugar (glucose) after you have not eaten for a while (fasting). You may have this done every 1-3 years. Bone density scan. This is done to screen for osteoporosis. You may have this done starting at age 33. Mammogram. This may be done every 1-2 years. Talk to your health care provider about how often you should have regular mammograms. Talk with your health care provider about your test results, treatment options, and if necessary, the need for more tests. Vaccines  Your health care provider may recommend certain vaccines, such as: Influenza vaccine. This is recommended every year. Tetanus, diphtheria, and acellular pertussis (Tdap, Td) vaccine. You may need a Td booster every 10 years. Zoster vaccine. You may need this after age 18. Pneumococcal 13-valent conjugate (PCV13) vaccine. One dose is recommended  after age 21. Pneumococcal  polysaccharide (PPSV23) vaccine. One dose is recommended after age 42. Talk to your health care provider about which screenings and vaccines you need and how often you need them. This information is not intended to replace advice given to you by your health care provider. Make sure you discuss any questions you have with your health care provider. Document Released: 02/06/2015 Document Revised: 09/30/2015 Document Reviewed: 11/11/2014 Elsevier Interactive Patient Education  2017 [place] Prevention in the Home Falls can cause injuries. They can happen to people of all ages. There are many things you can do to make your home safe and to help prevent falls. What can I do on the outside of my home? Regularly fix the edges of walkways and driveways and fix any cracks. Remove anything that might make you trip as you walk through a door, such as a raised step or threshold. Trim any bushes or trees on the path to your home. Use bright outdoor lighting. Clear any walking paths of anything that might make someone trip, such as rocks or tools. Regularly check to see if handrails are loose or broken. Make sure that both sides of any steps have handrails. Any raised decks and porches should have guardrails on the edges. Have any leaves, snow, or ice cleared regularly. Use sand or salt on walking paths during winter. Clean up any spills in your garage right away. This includes oil or grease spills. What can I do in the bathroom? Use night lights. Install grab bars by the toilet and in the tub and shower. Do not use towel bars as grab bars. Use non-skid mats or decals in the tub or shower. If you need to sit down in the shower, use a plastic, non-slip stool. Keep the floor dry. Clean up any water that spills on the floor as soon as it happens. Remove soap buildup in the tub or shower regularly. Attach bath mats securely with double-sided non-slip rug tape. Do not have throw rugs and other  things on the floor that can make you trip. What can I do in the bedroom? Use night lights. Make sure that you have a light by your bed that is easy to reach. Do not use any sheets or blankets that are too big for your bed. They should not hang down onto the floor. Have a firm chair that has side arms. You can use this for support while you get dressed. Do not have throw rugs and other things on the floor that can make you trip. What can I do in the kitchen? Clean up any spills right away. Avoid walking on wet floors. Keep items that you use a lot in easy-to-reach places. If you need to reach something above you, use a strong step stool that has a grab bar. Keep electrical cords out of the way. Do not use floor polish or wax that makes floors slippery. If you must use wax, use non-skid floor wax. Do not have throw rugs and other things on the floor that can make you trip. What can I do with my stairs? Do not leave any items on the stairs. Make sure that there are handrails on both sides of the stairs and use them. Fix handrails that are broken or loose. Make sure that handrails are as long as the stairways. Check any carpeting to make sure that it is firmly attached to the stairs. Fix any carpet that is loose or worn. Avoid having throw  rugs at the top or bottom of the stairs. If you do have throw rugs, attach them to the floor with carpet tape. Make sure that you have a light switch at the top of the stairs and the bottom of the stairs. If you do not have them, ask someone to add them for you. What else can I do to help prevent falls? Wear shoes that: Do not have high heels. Have rubber bottoms. Are comfortable and fit you well. Are closed at the toe. Do not wear sandals. If you use a stepladder: Make sure that it is fully opened. Do not climb a closed stepladder. Make sure that both sides of the stepladder are locked into place. Ask someone to hold it for you, if possible. Clearly  mark and make sure that you can see: Any grab bars or handrails. First and last steps. Where the edge of each step is. Use tools that help you move around (mobility aids) if they are needed. These include: Canes. Walkers. Scooters. Crutches. Turn on the lights when you go into a dark area. Replace any light bulbs as soon as they burn out. Set up your furniture so you have a clear path. Avoid moving your furniture around. If any of your floors are uneven, fix them. If there are any pets around you, be aware of where they are. Review your medicines with your doctor. Some medicines can make you feel dizzy. This can increase your chance of falling. Ask your doctor what other things that you can do to help prevent falls. This information is not intended to replace advice given to you by your health care provider. Make sure you discuss any questions you have with your health care provider. Document Released: 11/06/2008 Document Revised: 06/18/2015 Document Reviewed: 02/14/2014 Elsevier Interactive Patient Education  2017 [employer].

## 2021-08-03 ENCOUNTER — Ambulatory Visit (INDEPENDENT_AMBULATORY_CARE_PROVIDER_SITE_OTHER): Payer: Medicare PPO | Admitting: Family Medicine

## 2021-08-03 ENCOUNTER — Encounter: Payer: Self-pay | Admitting: Family Medicine

## 2021-08-03 VITALS — BP 158/60 | HR 53 | Temp 97.8°F | Ht 64.5 in | Wt 111.4 lb

## 2021-08-03 DIAGNOSIS — F03A4 Unspecified dementia, mild, with anxiety: Secondary | ICD-10-CM

## 2021-08-03 DIAGNOSIS — R7303 Prediabetes: Secondary | ICD-10-CM | POA: Diagnosis not present

## 2021-08-03 DIAGNOSIS — I10 Essential (primary) hypertension: Secondary | ICD-10-CM | POA: Diagnosis not present

## 2021-08-03 DIAGNOSIS — F411 Generalized anxiety disorder: Secondary | ICD-10-CM | POA: Diagnosis not present

## 2021-08-03 DIAGNOSIS — F5104 Psychophysiologic insomnia: Secondary | ICD-10-CM

## 2021-08-03 DIAGNOSIS — M48061 Spinal stenosis, lumbar region without neurogenic claudication: Secondary | ICD-10-CM

## 2021-08-03 DIAGNOSIS — M810 Age-related osteoporosis without current pathological fracture: Secondary | ICD-10-CM

## 2021-08-03 DIAGNOSIS — F331 Major depressive disorder, recurrent, moderate: Secondary | ICD-10-CM

## 2021-08-03 DIAGNOSIS — Z0000 Encounter for general adult medical examination without abnormal findings: Secondary | ICD-10-CM

## 2021-08-03 MED ORDER — QUETIAPINE FUMARATE 25 MG PO TABS: 25.0000 mg | ORAL_TABLET | Freq: Every day | ORAL | 0 refills | Status: DC

## 2021-08-03 NOTE — Assessment & Plan Note (Signed)
Typically well controlled with Klonopin and trazodone.  She will instead use Seroquel at bedtime for sleep.  She will try to limit Klonopin use and will hold trazodone entirely.

## 2021-08-03 NOTE — Assessment & Plan Note (Signed)
Chronic, elevated in office today.  She will follow the blood pressure at home on amlodipine 2.5 mg daily.  She will let me know if it is above goal greater than 140/90.

## 2021-08-03 NOTE — Progress Notes (Signed)
Patient ID: Catherine Young, female    DOB: December 08, 1931, 88 y.o.   MRN: [id number]  This visit was conducted in person.  BP (!) 158/60   Pulse (!) 53   Temp 97.8 F (36.6 C) (Oral)   Ht 5' 4.5" (1.638 m)   Wt 111 lb 6 oz (50.5 kg)   SpO2 96%   BMI 18.82 kg/m    CC:  Chief Complaint  Patient presents with   Annual Exam    Part 2 ([place] 06/22/21)    Subjective:   HPI: Catherine Young is a 88 y.o. female presenting on 08/03/2021 for Annual Exam (Part 2 ([place] 06/22/21))  The patient presents for  complete physical and review of chronic health problems. He/She also has the following acute concerns today:  She has been having a lot of  low back pain.. radiated to thighs and lower legs.  Pain worse with sitting to standing. Pain worse in the AMs.  She has weakness in legs,  some numbness.  Advil helps minimally.  Rarely using hydrocodone.  Neurontin .Marland Kitchen Did not help with pain at 100 mg every morning   MDD: moderate control on  sertraline 100 mg daily, trazodone for sleep, and klonopin as needed for anxiety   Dementia, gradual worsening per pt. 3 sisters with Alzheimer's   Labs work up with TSH, Vit D, B12, CMET normal 2022  MMSE 05/07/2020 29/30 Clock drawing  normal  animal recall 6   Due for lab re-eval with lipids, CMET and A1C.   Hypertension:  Elevated in office today despite  BP Readings from Last 3 Encounters:  08/03/21 (!) 158/60  03/23/21 138/82  11/30/20 (!) 141/64  Using medication without problems or lightheadedness:  none Chest pain with exertion: none Edema: none Short of breath: none Average home BPs: Other issues:   Relevant past medical, surgical, family and social history reviewed and updated as indicated. Interim medical history since our last visit reviewed. Allergies and medications reviewed and updated. Outpatient Medications Prior to Visit  Medication Sig Dispense Refill   amLODipine (NORVASC) 2.5 MG tablet Take 1 tablet (2.5 mg total) by  mouth daily. 90 tablet 3   clonazePAM (KLONOPIN) 1 MG tablet TAKE 1/2 TABLET(0.5 MG) BY MOUTH TWICE DAILY AS NEEDED FOR ANXIETY 90 tablet 0   sertraline (ZOLOFT) 100 MG tablet TAKE 1 TABLET(100 MG) BY MOUTH AT BEDTIME 90 tablet 1   traZODone (DESYREL) 100 MG tablet TAKE 1 TABLET(100 MG) BY MOUTH AT BEDTIME AS NEEDED FOR SLEEP 90 tablet 0   erythromycin ophthalmic ointment Place 1 application into the right eye at bedtime. (Patient not taking: Reported on 06/22/2021) 3.5 g 0   No facility-administered medications prior to visit.     Per HPI unless specifically indicated in ROS section below Review of Systems  Constitutional:  Negative for fatigue and fever.  HENT:  Negative for congestion.   Eyes:  Negative for pain.  Respiratory:  Negative for cough and shortness of breath.   Cardiovascular:  Negative for chest pain, palpitations and leg swelling.  Gastrointestinal:  Negative for abdominal pain.  Genitourinary:  Negative for dysuria and vaginal bleeding.  Musculoskeletal:  Negative for back pain.  Neurological:  Negative for syncope, light-headedness and headaches.  Psychiatric/Behavioral:  Negative for dysphoric mood.    Objective:  BP (!) 158/60   Pulse (!) 53   Temp 97.8 F (36.6 C) (Oral)   Ht 5' 4.5" (1.638 m)   Wt 111 lb 6  oz (50.5 kg)   SpO2 96%   BMI 18.82 kg/m   Wt Readings from Last 3 Encounters:  08/03/21 111 lb 6 oz (50.5 kg)  06/22/21 115 lb (52.2 kg)  03/23/21 113 lb 12.8 oz (51.6 kg)      Physical Exam Constitutional:      General: She is not in acute distress.    Appearance: Normal appearance. She is well-developed. She is not ill-appearing or toxic-appearing.  HENT:     Head: Normocephalic.     Right Ear: Hearing, tympanic membrane, ear canal and external ear normal. Tympanic membrane is not erythematous, retracted or bulging.     Left Ear: Hearing, tympanic membrane, ear canal and external ear normal. Tympanic membrane is not erythematous, retracted or  bulging.     Nose: No mucosal edema or rhinorrhea.     Right Sinus: No maxillary sinus tenderness or frontal sinus tenderness.     Left Sinus: No maxillary sinus tenderness or frontal sinus tenderness.     Mouth/Throat:     Pharynx: Uvula midline.  Eyes:     General: Lids are normal. Lids are everted, no foreign bodies appreciated.     Conjunctiva/sclera: Conjunctivae normal.     Pupils: Pupils are equal, round, and reactive to light.  Neck:     Thyroid: No thyroid mass or thyromegaly.     Vascular: No carotid bruit.     Trachea: Trachea normal.  Cardiovascular:     Rate and Rhythm: Normal rate and regular rhythm.     Pulses: Normal pulses.     Heart sounds: Normal heart sounds, S1 normal and S2 normal. No murmur heard.    No friction rub. No gallop.  Pulmonary:     Effort: Pulmonary effort is normal. No tachypnea or respiratory distress.     Breath sounds: Normal breath sounds. No decreased breath sounds, wheezing, rhonchi or rales.  Abdominal:     General: Bowel sounds are normal.     Palpations: Abdomen is soft.     Tenderness: There is no abdominal tenderness.  Musculoskeletal:     Cervical back: Normal range of motion and neck supple.  Skin:    General: Skin is warm and dry.     Findings: No rash.  Neurological:     Mental Status: She is alert.  Psychiatric:        Mood and Affect: Mood is not anxious or depressed.        Speech: Speech normal.        Behavior: Behavior normal. Behavior is cooperative.        Thought Content: Thought content normal.        Judgment: Judgment normal.       Results for orders placed or performed in visit on 03/23/21  Renal Function Panel  Result Value Ref Range   Sodium 139 135 - 145 mEq/L   Potassium 4.2 3.5 - 5.1 mEq/L   Chloride 100 96 - 112 mEq/L   CO2 33 (H) 19 - 32 mEq/L   Albumin 4.4 3.5 - 5.2 g/dL   BUN 22 6 - 23 mg/dL   Creatinine, Ser 0.97 0.40 - 1.20 mg/dL   Glucose, Bld 71 70 - 99 mg/dL   Phosphorus 4.2 2.3 - 4.6  mg/dL   GFR 52.57 (L) >60.00 mL/min   Calcium 9.4 8.4 - 10.5 mg/dL     COVID 19 screen:  No recent travel or known exposure to COVID19 The patient denies respiratory symptoms of COVID  19 at this time. The importance of social distancing was discussed today.   Assessment and Plan The patient's preventative maintenance and recommended screening tests for an annual wellness exam were reviewed in full today. Brought up to date unless services declined.  Counselled on the importance of diet, exercise, and its role in overall health and mortality. The patient's FH and SH was reviewed, including their home life, tobacco status, and drug and alcohol status.   Vaccines: reviewed, uptodate except refused td and shingles vaccines Pap/DVE:  Not indicated given age 25: not indicated given age Bone Density: 2018 DEXA.. Osteoporosis in hip started on alendronate weekly  12/2016 but had severe SE. Taking ca and Vit D   Started on prolia 03/2021... repeat DEXA in 1 year. Smoking Status: none ETOH/ drug [id number]     Problem List Items Addressed This Visit     Chronic insomnia    Typically well controlled with Klonopin and trazodone.  She will instead use Seroquel at bedtime for sleep.  She will try to limit Klonopin use and will hold trazodone entirely.      Dementia ([place])    Mild, chronic  At this point in time she does not want additional medication for possible Alzheimer's dementia.  We will first start treatment of depression to see if we have any improvement in her memory.  Lab work-up so far has been normal in 2022.      Relevant Medications   QUEtiapine (SEROQUEL) 25 MG tablet   GAD (generalized anxiety disorder)   Moderate recurrent major depression (HCC)    Chronic, poor control  We have tried multiple different medications for this patient.  At this point we will have her hold trazodone and try Seroquel as an adjunct to her depression medicines.  She will continue sertraline 100  mg daily.      Osteoporosis    Chronic, poor control  Now status post Prolia every 6 months x1 injection.  We will reevaluate bone density in 1 year.      Primary hypertension    Chronic, elevated in office today.  She will follow the blood pressure at home on amlodipine 2.5 mg daily.  She will let me know if it is above goal greater than 140/90.      Relevant Orders   Comprehensive metabolic panel   Lipid panel   Spinal stenosis    Chronic, causing significant pain.  She is hesitant to make changes with her medication.  She states Neurontin does not help much but the hydrocodone does help when she uses it in a limited capacity.  We discussed considering trying a higher dose of Neurontin 100 mg p.o. twice daily if it is not over sedating.  She will consider this in the future.      Other Visit Diagnoses     Routine general medical examination at a health care facility    -  Primary   Prediabetes       Relevant Orders   Hemoglobin A1c        Eliezer Lofts, MD

## 2021-08-03 NOTE — Patient Instructions (Addendum)
Stop trazodone for sleep. Start seroquel low dose at night.  Continue sertraline 100 mg daily  and klonopin as needed. Can try Neurontin 100 mg TWICE DAILY for pain if you want.  Check blood pressure at home once or twice a week... goal < 150/90.

## 2021-08-03 NOTE — Assessment & Plan Note (Signed)
Chronic, poor control  Now status post Prolia every 6 months x1 injection.  We will reevaluate bone density in 1 year.

## 2021-08-03 NOTE — Assessment & Plan Note (Signed)
Chronic, causing significant pain.  She is hesitant to make changes with her medication.  She states Neurontin does not help much but the hydrocodone does help when she uses it in a limited capacity.  We discussed considering trying a higher dose of Neurontin 100 mg p.o. twice daily if it is not over sedating.  She will consider this in the future.

## 2021-08-03 NOTE — Assessment & Plan Note (Signed)
Chronic, poor control  We have tried multiple different medications for this patient.  At this point we will have her hold trazodone and try Seroquel as an adjunct to her depression medicines.  She will continue sertraline 100 mg daily.

## 2021-08-03 NOTE — Assessment & Plan Note (Signed)
Mild, chronic  At this point in time she does not want additional medication for possible Alzheimer's dementia.  We will first start treatment of depression to see if we have any improvement in her memory.  Lab work-up so far has been normal in 2022.

## 2021-08-04 LAB — COMPREHENSIVE METABOLIC PANEL
ALT: 10 U/L (ref 0–35)
AST: 20 U/L (ref 0–37)
Albumin: 4.5 g/dL (ref 3.5–5.2)
Alkaline Phosphatase: 32 U/L — ABNORMAL LOW (ref 39–117)
BUN: 18 mg/dL (ref 6–23)
CO2: 32 mEq/L (ref 19–32)
Calcium: 9.5 mg/dL (ref 8.4–10.5)
Chloride: 97 mEq/L (ref 96–112)
Creatinine, Ser: 0.92 mg/dL (ref 0.40–1.20)
GFR: 55.87 mL/min — ABNORMAL LOW (ref >60.00)
Glucose, Bld: 86 mg/dL (ref 70–99)
Potassium: 4.3 mEq/L (ref 3.5–5.1)
Sodium: 136 mEq/L (ref 135–145)
Total Bilirubin: 0.5 mg/dL (ref 0.2–1.2)
Total Protein: 7.1 g/dL (ref 6.0–8.3)

## 2021-08-04 LAB — LIPID PANEL
Cholesterol: 208 mg/dL — ABNORMAL HIGH (ref 0–200)
HDL: 96.2 mg/dL (ref >39.00)
LDL Cholesterol: 97 mg/dL (ref 0–99)
NonHDL: 112.17
Total CHOL/HDL Ratio: 2
Triglycerides: 78 mg/dL (ref 0.0–149.0)
VLDL: 15.6 mg/dL (ref 0.0–40.0)

## 2021-08-04 LAB — HEMOGLOBIN A1C: Hgb A1c MFr Bld: 5.9 % (ref 4.6–6.5)

## 2021-08-06 ENCOUNTER — Telehealth: Payer: Self-pay | Admitting: Family Medicine

## 2021-08-06 NOTE — Telephone Encounter (Signed)
Patient called to schedule follow up for August based on what Dr Diona Browner said and she wanted to know if it could be over the phone or if it needed to be in office. Callback is [phone]

## 2021-08-06 NOTE — Telephone Encounter (Signed)
Mood follow-up can be done virtually.

## 2021-08-11 ENCOUNTER — Other Ambulatory Visit: Payer: Self-pay | Admitting: Family Medicine

## 2021-08-19 NOTE — Telephone Encounter (Signed)
Patient has appt

## 2021-08-24 DIAGNOSIS — H2512 Age-related nuclear cataract, left eye: Secondary | ICD-10-CM | POA: Diagnosis not present

## 2021-08-24 DIAGNOSIS — Z0100 Encounter for examination of eyes and vision without abnormal findings: Secondary | ICD-10-CM | POA: Diagnosis not present

## 2021-09-03 ENCOUNTER — Ambulatory Visit: Payer: Medicare PPO | Admitting: Family Medicine

## 2021-09-03 ENCOUNTER — Encounter: Payer: Self-pay | Admitting: Family Medicine

## 2021-09-03 VITALS — BP 120/68 | HR 54 | Temp 97.6°F | Ht 64.5 in | Wt 110.0 lb

## 2021-09-03 DIAGNOSIS — F03A4 Unspecified dementia, mild, with anxiety: Secondary | ICD-10-CM

## 2021-09-03 DIAGNOSIS — F331 Major depressive disorder, recurrent, moderate: Secondary | ICD-10-CM | POA: Diagnosis not present

## 2021-09-03 DIAGNOSIS — M25552 Pain in left hip: Secondary | ICD-10-CM

## 2021-09-03 DIAGNOSIS — F411 Generalized anxiety disorder: Secondary | ICD-10-CM | POA: Diagnosis not present

## 2021-09-03 DIAGNOSIS — M48061 Spinal stenosis, lumbar region without neurogenic claudication: Secondary | ICD-10-CM

## 2021-09-03 DIAGNOSIS — M25551 Pain in right hip: Secondary | ICD-10-CM | POA: Diagnosis not present

## 2021-09-03 DIAGNOSIS — F5104 Psychophysiologic insomnia: Secondary | ICD-10-CM | POA: Diagnosis not present

## 2021-09-03 MED ORDER — TRAZODONE HCL 50 MG PO TABS: 50.0000 mg | ORAL_TABLET | Freq: Every evening | ORAL | 5 refills | Status: DC | PRN

## 2021-09-03 MED ORDER — PREDNISONE 20 MG PO TABS: ORAL_TABLET | ORAL | 0 refills | Status: DC

## 2021-09-03 MED ORDER — TRAMADOL HCL 50 MG PO TABS: 50.0000 mg | ORAL_TABLET | Freq: Three times a day (TID) | ORAL | 0 refills | Status: AC | PRN

## 2021-09-03 NOTE — Assessment & Plan Note (Signed)
Chronic, poor control  She had side effects to Seroquel so we will stop this and return to trazodone as she felt this helped with sleep well. At this point she feels that if her pain was better her mood will be better overall.  If her mood does not improve with improvement of pain we can consider increasing sertraline.

## 2021-09-03 NOTE — Patient Instructions (Addendum)
Restart the trazodone for sleep at night.  Possible trochanteric bursitis in hips  as well as pain from spinal stenosis.  I will refer you to  Physical therapy. Complete prednisone taper.  Can use tramadol as needed twice daily for pain

## 2021-09-03 NOTE — Progress Notes (Signed)
Patient ID: Catherine Young, female    DOB: Oct 20, 1931, 88 y.o.   MRN: [id number]  This visit was conducted in person.  BP 120/68 (BP Location: Left Arm, Patient Position: Sitting, Cuff Size: Normal)   Pulse (!) 54   Temp 97.6 F (36.4 C) (Temporal)   Ht 5' 4.5" (1.638 m)   Wt 110 lb (49.9 kg)   SpO2 97%   BMI 18.59 kg/m    CC:  Chief Complaint  Patient presents with   Follow-up    Mood not doing well. Had to stop taking seroquel due to it making her feel awful. Patient is also having some dizziness today.     HPI: Catherine Young is a 88 y.o. female presenting on 09/03/2021 for Follow-up (Mood not doing well. Had to stop taking seroquel due to it making her feel awful. Patient is also having some dizziness today.)   Moderate recurrent major depression and GAD:    Poor control on sertraline 100 mg daily.Marland Kitchen addition of Seroquel at bedtime as adjunct made her feel bad.  Using klonopin 1/2 tab BID.  Continued decreased energy. Buproprion addition not helpful. Trazodone not helpful at night.    Dementia,  stable Not interested in medication to treat. Trying to treat MDD first.  She feels woozy.. no vertigo, not off balance.   She continues having  chronic back pain. Bilateral hip pain radiates to bilateral legs. Ongoing for years worse in last.  Feels like pins and needles.  Both legs are weak and numb.  Pain better when lying down.  She has had recent increase in pain.  No recent falls.  Has seen Dr, Sharlet Salina in past for  spinal stenosis.. Injections stopped helping.  Had compression fracture 10/2020  Using   Relevant past medical, surgical, family and social history reviewed and updated as indicated. Interim medical history since our last visit reviewed. Allergies and medications reviewed and updated. Outpatient Medications Prior to Visit  Medication Sig Dispense Refill   amLODipine (NORVASC) 2.5 MG tablet Take 1 tablet (2.5 mg total) by mouth daily. 90  tablet 3   clonazePAM (KLONOPIN) 1 MG tablet TAKE 1/2 TABLET(0.5 MG) BY MOUTH TWICE DAILY AS NEEDED FOR ANXIETY 90 tablet 0   sertraline (ZOLOFT) 100 MG tablet TAKE 1 TABLET(100 MG) BY MOUTH AT BEDTIME 90 tablet 1   QUEtiapine (SEROQUEL) 25 MG tablet Take 1 tablet (25 mg total) by mouth at bedtime. 30 tablet 0   No facility-administered medications prior to visit.     Per HPI unless specifically indicated in ROS section below Review of Systems  Constitutional:  Positive for fatigue. Negative for fever.  HENT:  Negative for congestion.   Eyes:  Negative for pain.  Respiratory:  Negative for cough and shortness of breath.   Cardiovascular:  Negative for chest pain, palpitations and leg swelling.  Gastrointestinal:  Negative for abdominal pain.  Genitourinary:  Negative for dysuria and vaginal bleeding.  Musculoskeletal:  Positive for arthralgias and back pain.  Neurological:  Positive for weakness. Negative for syncope, light-headedness and headaches.  Psychiatric/Behavioral:  Negative for dysphoric mood.    Objective:  BP 120/68 (BP Location: Left Arm, Patient Position: Sitting, Cuff Size: Normal)   Pulse (!) 54   Temp 97.6 F (36.4 C) (Temporal)   Ht 5' 4.5" (1.638 m)   Wt 110 lb (49.9 kg)   SpO2 97%   BMI 18.59 kg/m   Wt Readings from Last 3 Encounters:  09/03/21 110 lb (  49.9 kg)  08/03/21 111 lb 6 oz (50.5 kg)  06/22/21 115 lb (52.2 kg)      Physical Exam Constitutional:      General: She is not in acute distress.    Appearance: Normal appearance. She is well-developed. She is not ill-appearing or toxic-appearing.  HENT:     Head: Normocephalic.     Right Ear: Hearing, tympanic membrane, ear canal and external ear normal. Tympanic membrane is not erythematous, retracted or bulging.     Left Ear: Hearing, tympanic membrane, ear canal and external ear normal. Tympanic membrane is not erythematous, retracted or bulging.     Nose: No mucosal edema or rhinorrhea.     Right  Sinus: No maxillary sinus tenderness or frontal sinus tenderness.     Left Sinus: No maxillary sinus tenderness or frontal sinus tenderness.     Mouth/Throat:     Pharynx: Uvula midline.  Eyes:     General: Lids are normal. Lids are everted, no foreign bodies appreciated.     Conjunctiva/sclera: Conjunctivae normal.     Pupils: Pupils are equal, round, and reactive to light.  Neck:     Thyroid: No thyroid mass or thyromegaly.     Vascular: No carotid bruit.     Trachea: Trachea normal.  Cardiovascular:     Rate and Rhythm: Normal rate and regular rhythm.     Pulses: Normal pulses.     Heart sounds: Normal heart sounds, S1 normal and S2 normal. No murmur heard.    No friction rub. No gallop.  Pulmonary:     Effort: Pulmonary effort is normal. No tachypnea or respiratory distress.     Breath sounds: Normal breath sounds. No decreased breath sounds, wheezing, rhonchi or rales.  Abdominal:     General: Bowel sounds are normal.     Palpations: Abdomen is soft.     Tenderness: There is no abdominal tenderness.  Musculoskeletal:     Cervical back: Normal, normal range of motion and neck supple.     Thoracic back: Normal.     Lumbar back: Tenderness present. No bony tenderness. Normal range of motion. Negative right straight leg raise test and negative left straight leg raise test.     Comments: No focal vertebral tenderness  Skin:    General: Skin is warm and dry.     Findings: No rash.  Neurological:     Mental Status: She is alert.  Psychiatric:        Mood and Affect: Mood is not anxious or depressed.        Speech: Speech normal.        Behavior: Behavior normal. Behavior is cooperative.        Thought Content: Thought content normal.        Judgment: Judgment normal.       Results for orders placed or performed in visit on 08/03/21  Comprehensive metabolic panel  Result Value Ref Range   Sodium 136 135 - 145 mEq/L   Potassium 4.3 3.5 - 5.1 mEq/L   Chloride 97 96 - 112  mEq/L   CO2 32 19 - 32 mEq/L   Glucose, Bld 86 70 - 99 mg/dL   BUN 18 6 - 23 mg/dL   Creatinine, Ser 0.92 0.40 - 1.20 mg/dL   Total Bilirubin 0.5 0.2 - 1.2 mg/dL   Alkaline Phosphatase 32 (L) 39 - 117 U/L   AST 20 0 - 37 U/L   ALT 10 0 - 35 U/L  Total Protein 7.1 6.0 - 8.3 g/dL   Albumin 4.5 3.5 - 5.2 g/dL   GFR 55.87 (L) >60.00 mL/min   Calcium 9.5 8.4 - 10.5 mg/dL  Lipid panel  Result Value Ref Range   Cholesterol 208 (H) 0 - 200 mg/dL   Triglycerides 78.0 0.0 - 149.0 mg/dL   HDL 96.20 >39.00 mg/dL   VLDL 15.6 0.0 - 40.0 mg/dL   LDL Cholesterol 97 0 - 99 mg/dL   Total CHOL/HDL Ratio 2    NonHDL 112.17   Hemoglobin A1c  Result Value Ref Range   Hgb A1c MFr Bld 5.9 4.6 - 6.5 %     COVID 19 screen:  No recent travel or known exposure to COVID19 The patient denies respiratory symptoms of COVID 19 at this time. The importance of social distancing was discussed today.   Assessment and Plan  Problem List Items Addressed This Visit     Bilateral hip pain    Acute worsening.  This could be secondary to her spinal stenosis but as she is focally tender to palpation over bilateral trochanteric bursa's I will treat her with prednisone as well as refer her to physical therapy.      Chronic insomnia   Dementia (HCC)   Relevant Medications   traZODone (DESYREL) 50 MG tablet   GAD (generalized anxiety disorder)   Relevant Medications   traZODone (DESYREL) 50 MG tablet   Moderate recurrent major depression ([place]) - Primary    Chronic, poor control  She had side effects to Seroquel so we will stop this and return to trazodone as she felt this helped with sleep well. At this point she feels that if her pain was better her mood will be better overall.  If her mood does not improve with improvement of pain we can consider increasing sertraline.      Relevant Medications   traZODone (DESYREL) 50 MG tablet   Spinal stenosis of lumbar region    Chronic  This is her primary issue  today She has seen PMNR in the past and has received steroid injections but these have become unhelpful.  She states today that the pain is worse and somewhat different.  She has no overt focal vertebral tenderness.  We will refer her to physical therapy at [facility]  She will use tramadol 1 tablet 2-3 times daily as needed for pain.          Eliezer Lofts, MD

## 2021-09-03 NOTE — Assessment & Plan Note (Signed)
Chronic  This is her primary issue today She has seen PMNR in the past and has received steroid injections but these have become unhelpful.  She states today that the pain is worse and somewhat different.  She has no overt focal vertebral tenderness.  We will refer her to physical therapy at [facility].  She will use tramadol 1 tablet 2-3 times daily as needed for pain.

## 2021-09-03 NOTE — Assessment & Plan Note (Signed)
Acute worsening.  This could be secondary to her spinal stenosis but as she is focally tender to palpation over bilateral trochanteric bursa's I will treat her with prednisone as well as refer her to physical therapy.

## 2021-09-09 ENCOUNTER — Telehealth: Payer: Self-pay | Admitting: Family Medicine

## 2021-09-09 NOTE — Telephone Encounter (Signed)
Pt said earlier today EMTS from [facility] came out and BP was 170 something / 90 something and pt was advised by EMT to go to ED but pt refused and EMT advised pt to take another amlodipine 2.5 mg (pt not sure of time). Now BP is 196/98 P 69 . Pt said had H/a earlier in wk  and still very dizzy today. Pt feels foggy headed. Pt does not feel well. Pt does not drive and pt feeling very tired and does not want to go to ED and wait along time to be seen. UC & ED precautions given and pt said she will call EMTs over to reck her BP an;d if still high and they tell her to go to ED this time she will go. Pt also scheduled appt with Dr Diona Browner on 09/10/21 at 12 noon to be seen. Sending note to Dr Diona Browner and I will speak with Dr Diona Browner also.

## 2021-09-09 NOTE — Telephone Encounter (Signed)
[name] from [facility] called and stated that Catherine Young blood pressure around 9pm last night was 180/72, she was experiencing some dizziness but refused to go to the hospital. Around 8am this morning her blood pressure was 178/80, and she refused to go to hospital. She stated that Catherine Young was saying that new medication she is taking is causing her dizziness. She also stated that she had a fall due to the dizziness. [name] stated that the patient would like for Dr. Diona Browner or someone to give her a call as she didn't want to go to ER. [name] said if need be you all can reach out to her as well. Thank you!

## 2021-09-10 ENCOUNTER — Encounter: Payer: Self-pay | Admitting: Family Medicine

## 2021-09-10 ENCOUNTER — Ambulatory Visit: Payer: Medicare PPO | Admitting: Family Medicine

## 2021-09-10 VITALS — BP 158/88 | HR 66 | Temp 97.8°F | Ht 64.5 in | Wt 109.4 lb

## 2021-09-10 DIAGNOSIS — I10 Essential (primary) hypertension: Secondary | ICD-10-CM

## 2021-09-10 NOTE — Patient Instructions (Addendum)
Continue amlodipine 2.5 mg at bedtime, in morning if blood pressure is> 150/90 take an additional 2.5 mg daily.  Remain off prednisone and tramadol.

## 2021-09-10 NOTE — Progress Notes (Signed)
Patient ID: Catherine Young, female    DOB: 1931-12-27, 88 y.o.   MRN: [id number]  This visit was conducted in person.  BP (!) 158/88 (BP Location: Left Arm, Cuff Size: Normal)   Pulse 66   Temp 97.8 F (36.6 C) (Oral)   Ht 5' 4.5" (1.638 m)   Wt 109 lb 6 oz (49.6 kg)   SpO2 97%   BMI 18.48 kg/m    CC:  Chief Complaint  Patient presents with   Hypertension    Subjective:   HPI: Catherine Young is a 88 y.o. female presenting on 09/10/2021 for Hypertension  Recent episode of elevated blood pressure despite taking amlodipine low dose.  Reviewed telephone note from yesterday.. BP at home was  170-190/98  [facility] EMT evaluated her and recommended ER visit. BP Readings from Last 3 Encounters:  09/10/21 (!) 158/88  09/03/21 120/68  08/03/21 (!) 158/60    She recently tried tramadol for pain starting on 8/12.  Made her feel dizzy and woozy, BP increased.  Has not taking any since BP increased.   Completed prednisone taper for back... this helped her back and leg pain but it came back when she stopped it.. Has completed the taper.      Relevant past medical, surgical, family and social history reviewed and updated as indicated. Interim medical history since our last visit reviewed. Allergies and medications reviewed and updated. Outpatient Medications Prior to Visit  Medication Sig Dispense Refill   amLODipine (NORVASC) 2.5 MG tablet Take 1 tablet (2.5 mg total) by mouth daily. 90 tablet 3   clonazePAM (KLONOPIN) 1 MG tablet TAKE 1/2 TABLET(0.5 MG) BY MOUTH TWICE DAILY AS NEEDED FOR ANXIETY 90 tablet 0   predniSONE (DELTASONE) 20 MG tablet 3 tabs by mouth daily x 3 days, then 2 tabs by mouth daily x 2 days then 1 tab by mouth daily x 2 days 15 tablet 0   sertraline (ZOLOFT) 100 MG tablet TAKE 1 TABLET(100 MG) BY MOUTH AT BEDTIME 90 tablet 1   traZODone (DESYREL) 50 MG tablet Take 1 tablet (50 mg total) by mouth at bedtime as needed for sleep. 30 tablet 5   No  facility-administered medications prior to visit.     Per HPI unless specifically indicated in ROS section below Review of Systems  Constitutional:  Negative for fatigue and fever.  HENT:  Negative for congestion.   Eyes:  Negative for pain.  Respiratory:  Negative for cough and shortness of breath.   Cardiovascular:  Negative for chest pain, palpitations and leg swelling.  Gastrointestinal:  Negative for abdominal pain.  Genitourinary:  Negative for dysuria and vaginal bleeding.  Musculoskeletal:  Negative for back pain.  Neurological:  Negative for syncope, light-headedness and headaches.  Psychiatric/Behavioral:  Negative for dysphoric mood.    Objective:  BP (!) 158/88 (BP Location: Left Arm, Cuff Size: Normal)   Pulse 66   Temp 97.8 F (36.6 C) (Oral)   Ht 5' 4.5" (1.638 m)   Wt 109 lb 6 oz (49.6 kg)   SpO2 97%   BMI 18.48 kg/m   Wt Readings from Last 3 Encounters:  09/10/21 109 lb 6 oz (49.6 kg)  09/03/21 110 lb (49.9 kg)  08/03/21 111 lb 6 oz (50.5 kg)      Physical Exam Constitutional:      General: She is not in acute distress.    Appearance: Normal appearance. She is well-developed. She is not ill-appearing or toxic-appearing.  HENT:     Head: Normocephalic.     Right Ear: Hearing, tympanic membrane, ear canal and external ear normal. Tympanic membrane is not erythematous, retracted or bulging.     Left Ear: Hearing, tympanic membrane, ear canal and external ear normal. Tympanic membrane is not erythematous, retracted or bulging.     Nose: No mucosal edema or rhinorrhea.     Right Sinus: No maxillary sinus tenderness or frontal sinus tenderness.     Left Sinus: No maxillary sinus tenderness or frontal sinus tenderness.     Mouth/Throat:     Pharynx: Uvula midline.  Eyes:     General: Lids are normal. Lids are everted, no foreign bodies appreciated.     Conjunctiva/sclera: Conjunctivae normal.     Pupils: Pupils are equal, round, and reactive to light.  Neck:      Thyroid: No thyroid mass or thyromegaly.     Vascular: No carotid bruit.     Trachea: Trachea normal.  Cardiovascular:     Rate and Rhythm: Normal rate and regular rhythm.     Pulses: Normal pulses.     Heart sounds: Normal heart sounds, S1 normal and S2 normal. No murmur heard.    No friction rub. No gallop.  Pulmonary:     Effort: Pulmonary effort is normal. No tachypnea or respiratory distress.     Breath sounds: Normal breath sounds. No decreased breath sounds, wheezing, rhonchi or rales.  Abdominal:     General: Bowel sounds are normal.     Palpations: Abdomen is soft.     Tenderness: There is no abdominal tenderness.  Musculoskeletal:     Cervical back: Normal range of motion and neck supple.  Skin:    General: Skin is warm and dry.     Findings: No rash.  Neurological:     Mental Status: She is alert.  Psychiatric:        Mood and Affect: Mood is not anxious or depressed.        Speech: Speech normal.        Behavior: Behavior normal. Behavior is cooperative.        Thought Content: Thought content normal.        Judgment: Judgment normal.       Results for orders placed or performed in visit on 08/03/21  Comprehensive metabolic panel  Result Value Ref Range   Sodium 136 135 - 145 mEq/L   Potassium 4.3 3.5 - 5.1 mEq/L   Chloride 97 96 - 112 mEq/L   CO2 32 19 - 32 mEq/L   Glucose, Bld 86 70 - 99 mg/dL   BUN 18 6 - 23 mg/dL   Creatinine, Ser 0.92 0.40 - 1.20 mg/dL   Total Bilirubin 0.5 0.2 - 1.2 mg/dL   Alkaline Phosphatase 32 (L) 39 - 117 U/L   AST 20 0 - 37 U/L   ALT 10 0 - 35 U/L   Total Protein 7.1 6.0 - 8.3 g/dL   Albumin 4.5 3.5 - 5.2 g/dL   GFR 55.87 (L) >60.00 mL/min   Calcium 9.5 8.4 - 10.5 mg/dL  Lipid panel  Result Value Ref Range   Cholesterol 208 (H) 0 - 200 mg/dL   Triglycerides 78.0 0.0 - 149.0 mg/dL   HDL 96.20 >39.00 mg/dL   VLDL 15.6 0.0 - 40.0 mg/dL   LDL Cholesterol 97 0 - 99 mg/dL   Total CHOL/HDL Ratio 2    NonHDL 112.17    Hemoglobin A1c  Result Value  Ref Range   Hgb A1c MFr Bld 5.9 4.6 - 6.5 %     COVID 19 screen:  No recent travel or known exposure to COVID19 The patient denies respiratory symptoms of COVID 19 at this time. The importance of social distancing was discussed today.   Assessment and Plan    Problem List Items Addressed This Visit     Primary hypertension - Primary     Continue amlodipine 2.5 mg at bedtime, in morning if blood pressure is> 150/90 take an additional 2.5 mg daily.  Remain off prednisone and tramadol.        Eliezer Lofts, MD

## 2021-09-25 ENCOUNTER — Other Ambulatory Visit: Payer: Self-pay | Admitting: Family Medicine

## 2021-09-29 DIAGNOSIS — H11003 Unspecified pterygium of eye, bilateral: Secondary | ICD-10-CM | POA: Diagnosis not present

## 2021-09-29 DIAGNOSIS — Z0100 Encounter for examination of eyes and vision without abnormal findings: Secondary | ICD-10-CM | POA: Diagnosis not present

## 2021-10-03 NOTE — Assessment & Plan Note (Signed)
Continue amlodipine 2.5 mg at bedtime, in morning if blood pressure is> 150/90 take an additional 2.5 mg daily.  Remain off prednisone and tramadol.

## 2021-10-19 ENCOUNTER — Telehealth: Payer: Self-pay

## 2021-10-19 DIAGNOSIS — M810 Age-related osteoporosis without current pathological fracture: Secondary | ICD-10-CM

## 2021-10-19 NOTE — Telephone Encounter (Signed)
Spoke with patient.  OOP cost $35. Lab on 10/20/21 and patient will call back to let me know when she can come for NV due to needing set up transportation.

## 2021-10-19 NOTE — Telephone Encounter (Signed)
Benefits submitted. Next injection due 9/29 or after  Left message for patient to call back to discuss. Appointment was made by the patient for 10/20/21. This needs to be rescheduled. Needs labs.

## 2021-10-20 ENCOUNTER — Ambulatory Visit: Payer: Medicare PPO

## 2021-10-20 ENCOUNTER — Other Ambulatory Visit (INDEPENDENT_AMBULATORY_CARE_PROVIDER_SITE_OTHER): Payer: Medicare PPO

## 2021-10-20 DIAGNOSIS — M810 Age-related osteoporosis without current pathological fracture: Secondary | ICD-10-CM | POA: Diagnosis not present

## 2021-10-20 LAB — BASIC METABOLIC PANEL
BUN: 17 mg/dL (ref 6–23)
CO2: 32 mEq/L (ref 19–32)
Calcium: 9.3 mg/dL (ref 8.4–10.5)
Chloride: 99 mEq/L (ref 96–112)
Creatinine, Ser: 1.01 mg/dL (ref 0.40–1.20)
GFR: 49.88 mL/min — ABNORMAL LOW (ref >60.00)
Glucose, Bld: 83 mg/dL (ref 70–99)
Potassium: 4.5 mEq/L (ref 3.5–5.1)
Sodium: 136 mEq/L (ref 135–145)

## 2021-10-21 ENCOUNTER — Ambulatory Visit: Payer: Medicare PPO

## 2021-10-21 NOTE — Telephone Encounter (Signed)
CrCl is  30.15 mL/min Calcium normal at 9.3

## 2021-11-04 ENCOUNTER — Other Ambulatory Visit: Payer: Self-pay | Admitting: Family Medicine

## 2021-11-04 NOTE — Telephone Encounter (Signed)
Last office visit 09/10/21 for HTN.  Last refilled 06/01/21 for #90 with no refills.  No future appointments with PCP.

## 2021-11-04 NOTE — Telephone Encounter (Signed)
Left message for patient to call back to schedule nurse visit for Prolia injection (Due anytime now)

## 2021-11-08 NOTE — Telephone Encounter (Signed)
Appt made for 11/10/21

## 2021-11-10 ENCOUNTER — Ambulatory Visit (INDEPENDENT_AMBULATORY_CARE_PROVIDER_SITE_OTHER): Payer: Medicare PPO

## 2021-11-10 DIAGNOSIS — M810 Age-related osteoporosis without current pathological fracture: Secondary | ICD-10-CM | POA: Diagnosis not present

## 2021-11-10 MED ORDER — DENOSUMAB 60 MG/ML ~~LOC~~ SOSY
60.0000 mg | PREFILLED_SYRINGE | Freq: Once | SUBCUTANEOUS | Status: AC
  Administered 2021-11-10: 60 mg via SUBCUTANEOUS

## 2021-11-10 NOTE — Progress Notes (Signed)
Patient presented for 75-monthProlia injection SQ to right arm given by JSherrilee Gilles CMA. Patient tolerated injection well.

## 2022-01-03 ENCOUNTER — Other Ambulatory Visit: Payer: Self-pay | Admitting: Family Medicine

## 2022-02-07 ENCOUNTER — Other Ambulatory Visit: Payer: Self-pay | Admitting: Family Medicine

## 2022-03-01 ENCOUNTER — Other Ambulatory Visit: Payer: Self-pay | Admitting: Family Medicine

## 2022-03-01 NOTE — Telephone Encounter (Signed)
Last office visit 09/10/21 for HTN.  Last refilled for #90 with no refills.  Next Appt: No future appointments.

## 2022-03-12 IMAGING — XA IR KYPHO VERTEBRAL LUMBAR AUGMENTATION
5 of 6 series · 13 of 24 positions shown · non-contrast
Comparison: MRI of the lumbar spine November 13, 2020.

INDICATION: 89-year-old female with intractable back pain since beginning [DATE] an MRI of the lumbar spine performed on November 13, 2020
showing compression fracture of the L1 vertebral body with
associated marrow edema. Pain interfering with activity of daily
life and without improvement with medical management. She presents
today for L1 core bone biopsy and balloon kyphoplasty.

EXAM:
FLUOROSCOPY GUIDED L1 CORE BONE BIOPSY AND BILATERAL TRANSPEDICULAR
APPROACH BALLOON KYPHOPLASTY

[Series 1: fl angio · 2 of 12 frames shown (1 of 4)]
[frame 1/12]
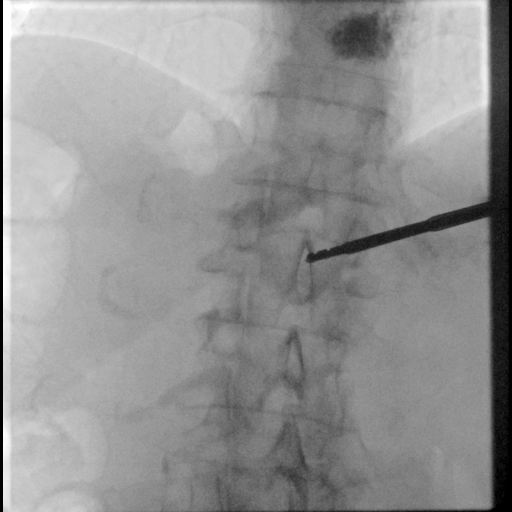
[frame 7/12]
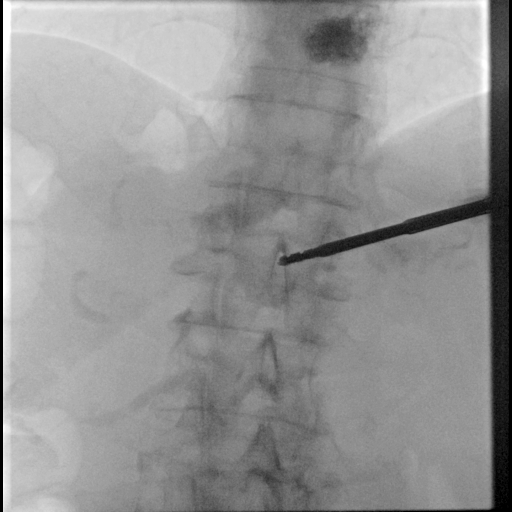

[Series 2: fl angio · 2 of 196 frames shown (2 of 4)]
[frame 30/196]
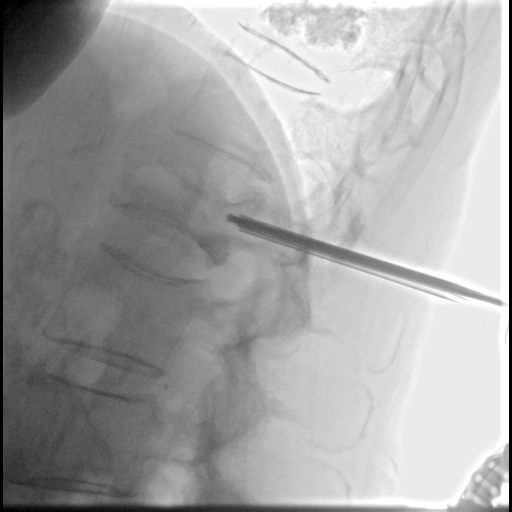
[frame 167/196]
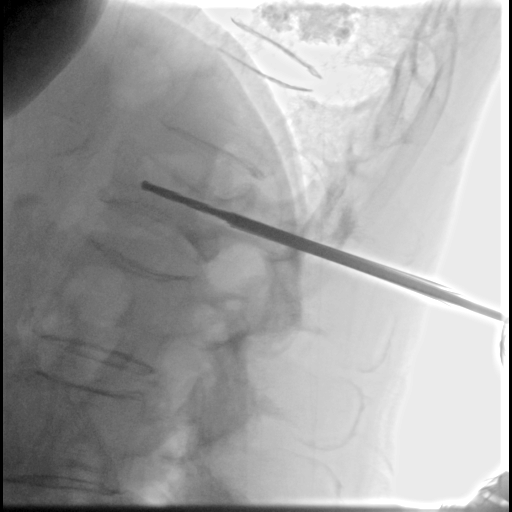

[Series 3: fl angio · 2 acquisitions, 4 frames shown (3 of 4)]
[im 1/2]
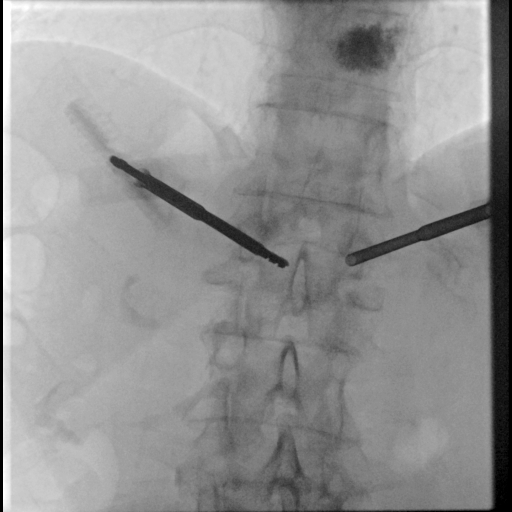
[im 2/2]
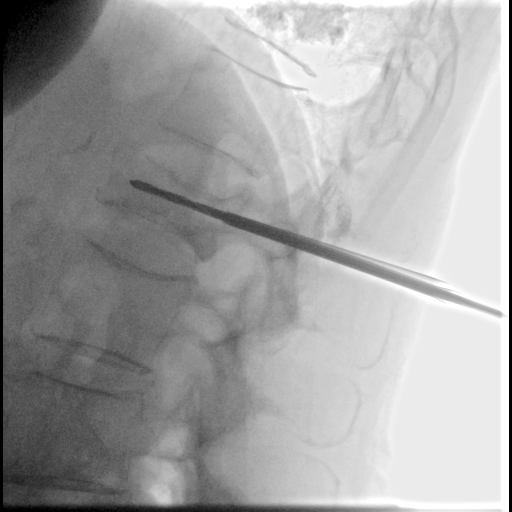
[im 2/2]
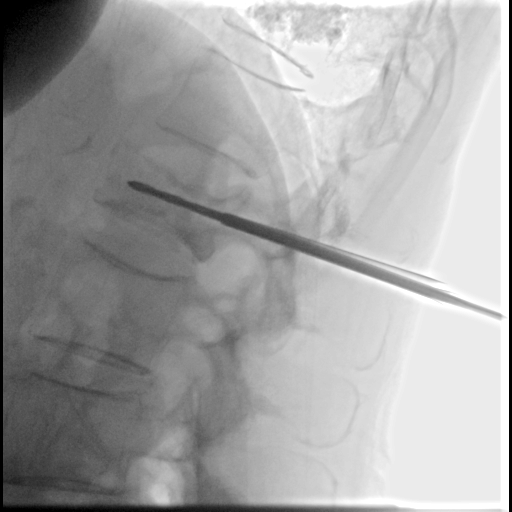
[im 2/2]
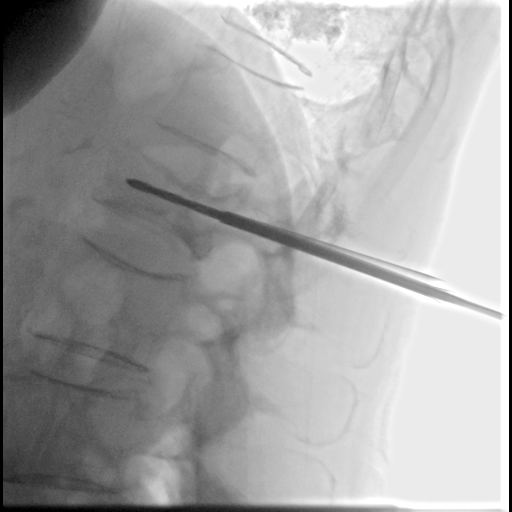

[Series 5: fl angio · 2 acquisitions, 4 frames shown (4 of 4)]
[im 1/2]
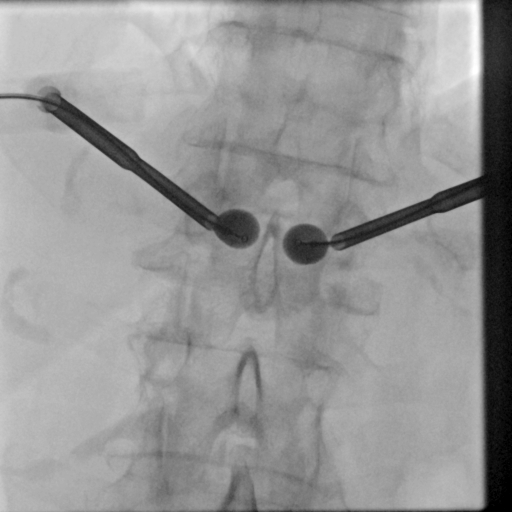
[im 1/2]
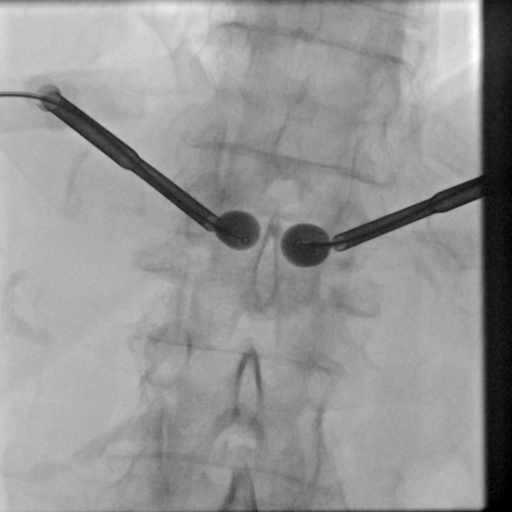
[im 2/2]
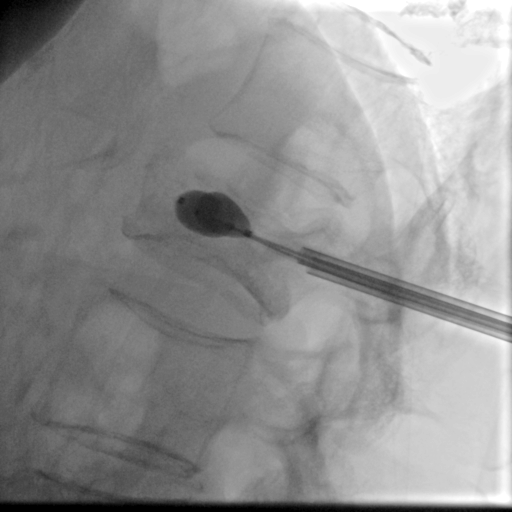
[im 2/2]
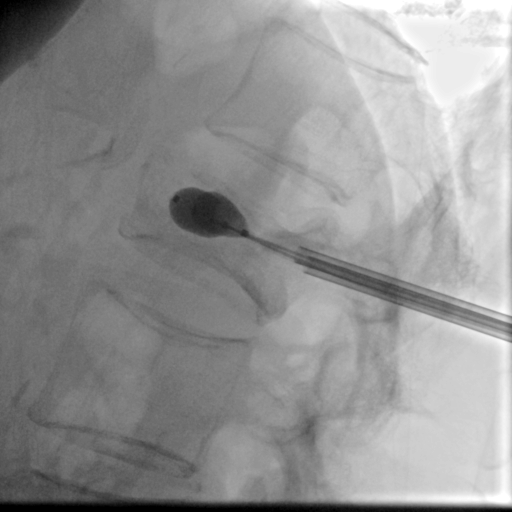

[Series 6: single · 1 of 2 slices shown]
[im 2/2]
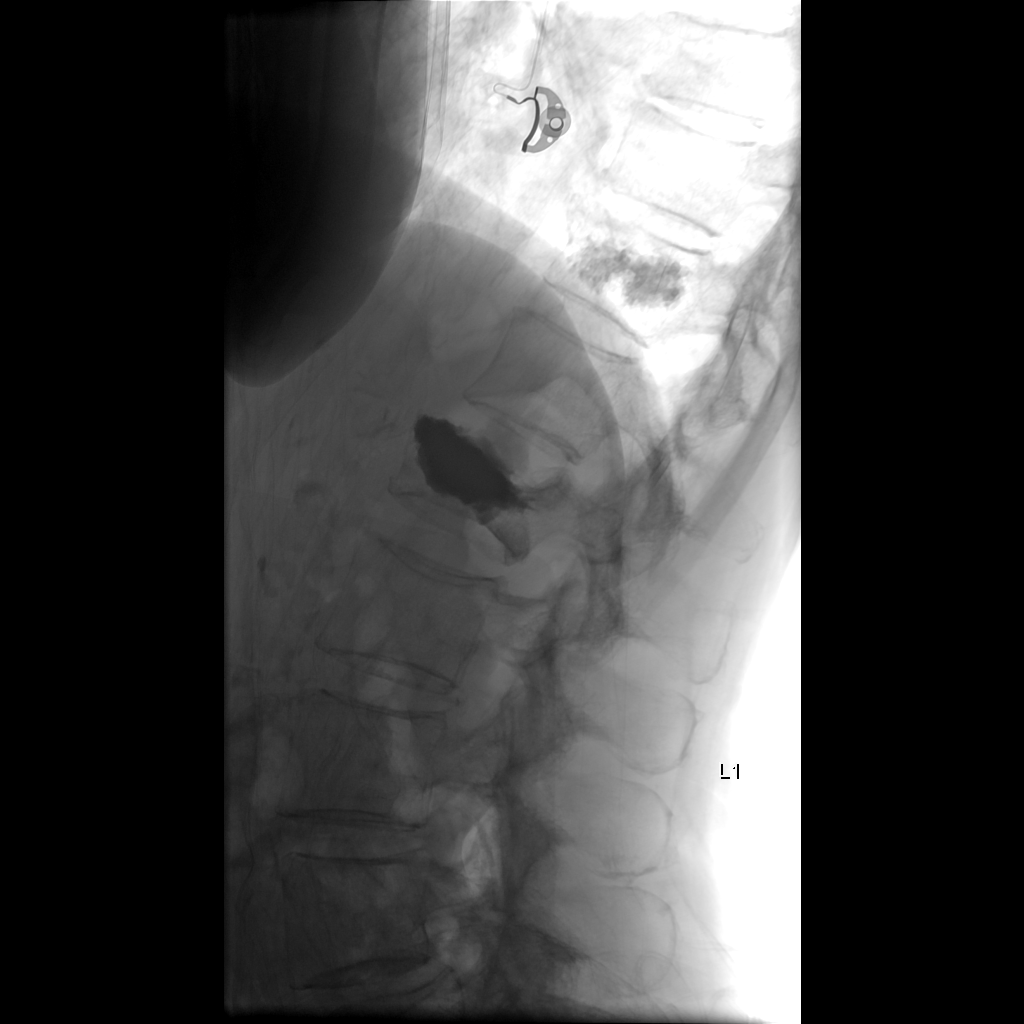

[13 of 24 positions shown; findings below may reference images not displayed]

MEDICATIONS:
As antibiotic prophylaxis, Ancef 2 gm IV was ordered pre-procedure
and administered intravenously within 1 hour of incision.

ANESTHESIA/SEDATION:
Moderate (conscious) sedation was employed during this procedure. A
total of Versed 2.5 mg and Fentanyl 125 mcg was administered
intravenously by the radiology nurse.

Total intra-service moderate Sedation Time: 52 minutes. The
patient's level of consciousness and vital signs were monitored
continuously by radiology nursing throughout the procedure under my
direct supervision.

FLUOROSCOPY TIME:  Fluoroscopy Time: 14 minutes (6551 mGy)

COMPLICATIONS:
None immediate.

PROCEDURE:
Following a full explanation of the procedure along with the
potential associated complications, an informed witnessed consent
was obtained.

The patient was placed in prone position on the angiography table.
The lumbar spine region was prepped and draped in a sterile fashion.

Under fluoroscopy, the L1 vertebral body was delineated and the skin
area was marked. The skin was infiltrated with a 1% Lidocaine
approximately 3.5 cm lateral to the spinous process projection on
the right. Using a 22-gauge spinal needle, the soft issue and the
peripedicular space and periosteum were infiltrated with Bupivacaine
0.5%. A skin incision was made at the access site.

Subsequently, an 11-gauge Kyphon trocar was inserted under
fluoroscopic guidance until contact with the pedicle was obtained.
The trocar was inserted under light [place] Aek into the pedicle
until the posterior boundaries of the vertebral body was reached.
The diamond mandrill was removed and one core biopsy wasobtained.

The skin was infiltrated with a 1% Lidocaine approximately 3.5 cm
lateral to the spinous process projection on the left. Using a
22-gauge spinal needle, the soft issue and the peripedicular space
and periosteum were infiltrated with Bupivacaine %. A skin incision
was made at the access site. Subsequently, an 11-gauge Kyphon trocar
was inserted under fluoroscopic guidance until contact with the
pedicle was obtained. The trocar was inserted under light Costelus
Sedrick into the pedicle until the posterior boundaries of the
vertebral body was reached. The diamond mandrill was removed.

A bone drill was coaxially advanced within the anterior third of the
vertebral body and then exchanged for inflatable Kyphon balloons.
These were centered within the mid-aspect of the vertebral body. The
balloons were inflated to create a void to serve as a repository for
the bone cement. Both balloons were deflated and through both
cannulas, under continuous fluoroscopy guidance in the AP and
lateral views, the vertebral body was filled with previously mixed
polymethyl-methacrylate (PMMA) added to barium for opacification.
Both cannulas were later removed.

The access sites were cleaned and covered with a sterile bandage.
IMPRESSION: Successful fluoroscopy-guided bilateral transpedicular approach for
L1 vertebral body core bone biopsy and kyphoplasty for treatment of
osteoporotic fragility fracture. Bone sample obtained was sent for
tissue exam.

## 2022-04-19 ENCOUNTER — Encounter: Payer: Self-pay | Admitting: Family Medicine

## 2022-04-19 ENCOUNTER — Ambulatory Visit: Payer: Medicare PPO | Admitting: Family Medicine

## 2022-04-19 VITALS — BP 120/60 | HR 62 | Temp 98.2°F | Ht 64.5 in | Wt 112.2 lb

## 2022-04-19 DIAGNOSIS — F03A4 Unspecified dementia, mild, with anxiety: Secondary | ICD-10-CM

## 2022-04-19 DIAGNOSIS — R42 Dizziness and giddiness: Secondary | ICD-10-CM | POA: Diagnosis not present

## 2022-04-19 DIAGNOSIS — R296 Repeated falls: Secondary | ICD-10-CM | POA: Diagnosis not present

## 2022-04-19 DIAGNOSIS — F5104 Psychophysiologic insomnia: Secondary | ICD-10-CM | POA: Diagnosis not present

## 2022-04-19 DIAGNOSIS — M48061 Spinal stenosis, lumbar region without neurogenic claudication: Secondary | ICD-10-CM | POA: Diagnosis not present

## 2022-04-19 MED ORDER — TRAMADOL HCL 50 MG PO TABS: 50.0000 mg | ORAL_TABLET | Freq: Two times a day (BID) | ORAL | 0 refills | Status: DC | PRN

## 2022-04-19 MED ORDER — TRAZODONE HCL 100 MG PO TABS: ORAL_TABLET | ORAL | 3 refills | Status: DC

## 2022-04-19 MED ORDER — DONEPEZIL HCL 5 MG PO TABS: 5.0000 mg | ORAL_TABLET | Freq: Every day | ORAL | 3 refills | Status: DC

## 2022-04-19 NOTE — Assessment & Plan Note (Addendum)
Chronic,   Tramadol was helpful, she denies side effects to this.  She would like to restart.  Of note given leg pain worse with walking consider decreased blood flow to legs but pulses are strong and equal bilaterally.

## 2022-04-19 NOTE — Patient Instructions (Addendum)
Try walker or four pronged cane for more balance. Can use tramadol for  pain as long   as it is not causing more dizziness.  Start Aricept for  dementia stability.

## 2022-04-19 NOTE — Assessment & Plan Note (Signed)
Chronic, most recent fall yesterday without injury.  No head injury. No current pain or sign of fracture on exam.  Encouraged her to use a walker for better stability, she is currently using a single prong cane. Reviewed fall safety at home.

## 2022-04-19 NOTE — Assessment & Plan Note (Signed)
MMSE: 29/30  Anemia recall 6   Will start Aricept low dose 5 mg daily. Regular exercise.

## 2022-04-19 NOTE — Assessment & Plan Note (Signed)
Chronic, unclear etiology of dizziness.  She states she does not feel it is secondary to her medications but she is on several sedating medications that we have tried to decrease.  Symptoms seem postural but blood pressure remains elevated on low-dose amlodipine.

## 2022-04-19 NOTE — Progress Notes (Signed)
Patient ID: Catherine Young, female    DOB: 1931/05/19, 89 y.o.   MRN: [id number]  This visit was conducted in person.  BP 120/60   Pulse 62   Temp 98.2 F (36.8 C) (Temporal)   Ht 5' 4.5" (1.638 m)   Wt 112 lb 4 oz (50.9 kg)   SpO2 96%   BMI 18.97 kg/m    CC:  Chief Complaint  Patient presents with   Dementia   Back Pain    Spinal Stenosis-Pain from waist down to toes bilateral   Fall    Subjective:   HPI: Catherine Young is a 89 y.o. female presenting on 04/19/2022 for Dementia, Back Pain (Spinal Stenosis-Pain from waist down to toes bilateral), and Fall  Reviewed past office visit notes.  Memory loss evaluated in 2022 Assessment as follows: Memory loss.. gradually worsening in last year. Forgetting where she puts stuff, short term memory.  No issue with remembering pills.  3 Sisters with Alzheimers.   UPDATE: normal thyroid, vitamins, CMET, etc. MMSE 29/30 At that time felt possible early Alzheimer's but she did not wish to use Aricept as it was not helpful in her sisters.  Today she reports continued memory issues   She feels she rests better  with trazodone 100 mg da  History of spinal stenosis with chronic pain from the waist down in bilateral legs. She has seen PMNR in the past and has received steroid injections but these have become unhelpful.  At [facility] in 08/2021.. referred to PT and started on tramadol 1 tablet 2-3 times daily prn.  She feels tramadol was helpful, did not resolve pain but helped some. She now reports no SE , something else was causing dizziness and issues.  Feet tingling and burning.. more pain when walking then at rest. She reports instability and frequent falls.  Most recent  fall  yesterday... using cane. Sat down hard.. no specific lasting pain.  Using cane, on prong.  No injury. , Balance poor, occ dizziness with sitting to standing.   Nurse checked it.. not low but high. BP Readings from Last 3 Encounters:  04/19/22 120/60   09/10/21 (!) 158/88  09/03/21 120/68          Relevant past medical, surgical, family and social history reviewed and updated as indicated. Interim medical history since our last visit reviewed. Allergies and medications reviewed and updated. Outpatient Medications Prior to Visit  Medication Sig Dispense Refill   amLODipine (NORVASC) 2.5 MG tablet TAKE 1 TABLET(2.5 MG) BY MOUTH DAILY 90 tablet 1   clonazePAM (KLONOPIN) 1 MG tablet TAKE 1/2 TABLET(0.5 MG) BY MOUTH TWICE DAILY AS NEEDED FOR ANXIETY 90 tablet 0   Ibuprofen-diphenhydrAMINE HCl (ADVIL PM) 200-25 MG CAPS Take 2 Capfuls by mouth at bedtime.     sertraline (ZOLOFT) 100 MG tablet TAKE 1 TABLET(100 MG) BY MOUTH AT BEDTIME 90 tablet 1   traZODone (DESYREL) 50 MG tablet TAKE 1 TABLET(50 MG) BY MOUTH AT BEDTIME AS NEEDED FOR SLEEP 30 tablet 5   No facility-administered medications prior to visit.     Per HPI unless specifically indicated in ROS section below Review of Systems  Constitutional:  Negative for fatigue and fever.  HENT:  Negative for congestion.   Eyes:  Negative for pain.  Respiratory:  Negative for cough and shortness of breath.   Cardiovascular:  Negative for chest pain, palpitations and leg swelling.  Gastrointestinal:  Negative for abdominal pain.  Genitourinary:  Negative for dysuria  and vaginal bleeding.  Musculoskeletal:  Positive for back pain and gait problem.  Neurological:  Positive for dizziness. Negative for syncope, light-headedness and headaches.  Psychiatric/Behavioral:  Negative for dysphoric mood.    Objective:  BP 120/60   Pulse 62   Temp 98.2 F (36.8 C) (Temporal)   Ht 5' 4.5" (1.638 m)   Wt 112 lb 4 oz (50.9 kg)   SpO2 96%   BMI 18.97 kg/m   Wt Readings from Last 3 Encounters:  04/19/22 112 lb 4 oz (50.9 kg)  09/10/21 109 lb 6 oz (49.6 kg)  09/03/21 110 lb (49.9 kg)      Physical Exam Constitutional:      General: She is not in acute distress.    Appearance: Normal  appearance. She is well-developed. She is not ill-appearing or toxic-appearing.  HENT:     Head: Normocephalic.     Right Ear: Hearing, tympanic membrane, ear canal and external ear normal. Tympanic membrane is not erythematous, retracted or bulging.     Left Ear: Hearing, tympanic membrane, ear canal and external ear normal. Tympanic membrane is not erythematous, retracted or bulging.     Nose: No mucosal edema or rhinorrhea.     Right Sinus: No maxillary sinus tenderness or frontal sinus tenderness.     Left Sinus: No maxillary sinus tenderness or frontal sinus tenderness.     Mouth/Throat:     Pharynx: Uvula midline.  Eyes:     General: Lids are normal. Lids are everted, no foreign bodies appreciated.     Conjunctiva/sclera: Conjunctivae normal.     Pupils: Pupils are equal, round, and reactive to light.  Neck:     Thyroid: No thyroid mass or thyromegaly.     Vascular: No carotid bruit.     Trachea: Trachea normal.  Cardiovascular:     Rate and Rhythm: Normal rate and regular rhythm.     Pulses: Normal pulses.          Dorsalis pedis pulses are 2+ on the right side and 2+ on the left side.       Posterior tibial pulses are 2+ on the right side and 2+ on the left side.     Heart sounds: Normal heart sounds, S1 normal and S2 normal. No murmur heard.    No friction rub. No gallop.  Pulmonary:     Effort: Pulmonary effort is normal. No tachypnea or respiratory distress.     Breath sounds: Normal breath sounds. No decreased breath sounds, wheezing, rhonchi or rales.  Abdominal:     General: Bowel sounds are normal.     Palpations: Abdomen is soft.     Tenderness: There is no abdominal tenderness.  Musculoskeletal:     Cervical back: Normal range of motion and neck supple. No tenderness or bony tenderness. No pain with movement.     Thoracic back: Tenderness present. No bony tenderness. Decreased range of motion.     Lumbar back: Tenderness present. No bony tenderness. Decreased range  of motion. Negative right straight leg raise test and negative left straight leg raise test. Scoliosis present.     Right hip: No tenderness or bony tenderness. Normal range of motion.     Left hip: No tenderness or bony tenderness. Normal range of motion.     Comments: Kyphosis  Skin:    General: Skin is warm and dry.     Findings: No rash.  Neurological:     Mental Status: She is alert.  Psychiatric:  Mood and Affect: Mood is not anxious or depressed.        Speech: Speech normal.        Behavior: Behavior normal. Behavior is cooperative.        Thought Content: Thought content normal.        Judgment: Judgment normal.       Results for orders placed or performed in visit on [id number]  Basic Metabolic Panel  Result Value Ref Range   Sodium 136 135 - 145 mEq/L   Potassium 4.5 3.5 - 5.1 mEq/L   Chloride 99 96 - 112 mEq/L   CO2 32 19 - 32 mEq/L   Glucose, Bld 83 70 - 99 mg/dL   BUN 17 6 - 23 mg/dL   Creatinine, Ser 1.01 0.40 - 1.20 mg/dL   GFR 49.88 (L) >60.00 mL/min   Calcium 9.3 8.4 - 10.5 mg/dL    Assessment and Plan  Spinal stenosis of lumbar region, unspecified whether neurogenic claudication present Assessment & Plan:  Chronic,   Tramadol was helpful, she denies side effects to this.  She would like to restart.  Of note given leg pain worse with walking consider decreased blood flow to legs but pulses are strong and equal bilaterally.     Mild dementia with anxiety, unspecified dementia type ([place]) Assessment & Plan:  MMSE: 29/30  Anemia recall 6   Will start Aricept low dose 5 mg daily. Regular exercise.   Dizziness Assessment & Plan: Chronic, unclear etiology of dizziness.  She states she does not feel it is secondary to her medications but she is on several sedating medications that we have tried to decrease.  Symptoms seem postural but blood pressure remains elevated on low-dose amlodipine.   Chronic insomnia  Frequent falls Assessment  & Plan: Chronic, most recent fall yesterday without injury.  No head injury. No current pain or sign of fracture on exam.  Encouraged her to use a walker for better stability, she is currently using a single prong cane. Reviewed fall safety at home.   Other orders -     traZODone HCl; TAKE 1 TABLET(50 MG) BY MOUTH AT BEDTIME AS NEEDED FOR SLEEP  Dispense: 30 tablet; Refill: 3 -     traMADol HCl; Take 1 tablet (50 mg total) by mouth every 12 (twelve) hours as needed.  Dispense: 60 tablet; Refill: 0 -     Donepezil HCl; Take 1 tablet (5 mg total) by mouth at bedtime.  Dispense: 90 tablet; Refill: 3    Return in about 3 months (around 07/20/2022) for follow up dementia aroicept start and chronic spinal stenosis pain tramadol.   Eliezer Lofts, MD

## 2022-04-20 ENCOUNTER — Telehealth: Payer: Self-pay | Admitting: Family Medicine

## 2022-04-20 NOTE — Telephone Encounter (Signed)
Spoke with [name] and advised that the Tramadol Rx was sent in yesterday but her insurance is requiring a PA which I faxed over to our PA department yesterday afternoon.  I advised I would let her know once I heard anything back on the PA.

## 2022-04-20 NOTE — Telephone Encounter (Signed)
Expected three medications yesterday at pharmacy. They did not have the tramadol. Can it be called in? [facility] at [address] and [address].

## 2022-04-21 ENCOUNTER — Other Ambulatory Visit (HOSPITAL_COMMUNITY): Payer: Self-pay

## 2022-04-21 ENCOUNTER — Telehealth: Payer: Self-pay

## 2022-04-21 NOTE — Telephone Encounter (Signed)
Pharmacy Patient Advocate Encounter   Received notification that prior authorization for Tramadol 50mg  is required/requested.    PA submitted on 04/21/22 to (ins) [employer] via CoverMyMeds Key  # [id number] Status is pending

## 2022-04-21 NOTE — Telephone Encounter (Signed)
Noted  

## 2022-04-22 NOTE — Telephone Encounter (Signed)
Patient Advocate Encounter  Prior Authorization for Tramadol 50mg  has been approved.    Effective through 01/24/23

## 2022-04-25 ENCOUNTER — Other Ambulatory Visit (HOSPITAL_COMMUNITY): Payer: Self-pay

## 2022-04-25 NOTE — Telephone Encounter (Signed)
Left message for Ms. Legleiter the the PA for her Tramadol has been approved.

## 2022-05-16 ENCOUNTER — Telehealth: Payer: Self-pay

## 2022-05-16 NOTE — Telephone Encounter (Signed)
New encounter created for Prolia BIV 

## 2022-05-16 NOTE — Telephone Encounter (Signed)
Please start new PA for Prolia.  Her last inj was on 11/10/2021.  Thank you!  Please respond to Barnie Mort at [facility].  I am just filling in for this week.

## 2022-05-16 NOTE — Telephone Encounter (Signed)
Prolia VOB initiated via [email or web address] 

## 2022-06-08 ENCOUNTER — Other Ambulatory Visit (HOSPITAL_COMMUNITY): Payer: Self-pay

## 2022-06-27 ENCOUNTER — Telehealth: Payer: Self-pay | Admitting: Family Medicine

## 2022-06-27 NOTE — Telephone Encounter (Signed)
Patient contacted the office with some questions regarding prolia, wanted to know if she needed to be scheduled for her next one. Please advise, thank you.

## 2022-06-29 ENCOUNTER — Encounter: Payer: Self-pay | Admitting: Family

## 2022-06-29 ENCOUNTER — Ambulatory Visit: Payer: Medicare PPO | Admitting: Family

## 2022-06-29 VITALS — BP 132/80 | HR 75 | Temp 97.7°F | Ht 64.0 in | Wt 110.2 lb

## 2022-06-29 DIAGNOSIS — F411 Generalized anxiety disorder: Secondary | ICD-10-CM | POA: Diagnosis not present

## 2022-06-29 DIAGNOSIS — R399 Unspecified symptoms and signs involving the genitourinary system: Secondary | ICD-10-CM

## 2022-06-29 DIAGNOSIS — R319 Hematuria, unspecified: Secondary | ICD-10-CM | POA: Insufficient documentation

## 2022-06-29 LAB — URINALYSIS, ROUTINE W REFLEX MICROSCOPIC
Bilirubin Urine: NEGATIVE
Ketones, ur: NEGATIVE
Nitrite: NEGATIVE
Specific Gravity, Urine: 1.01 (ref 1.000–1.030)
Total Protein, Urine: NEGATIVE
Urine Glucose: NEGATIVE
Urobilinogen, UA: 0.2 (ref 0.0–1.0)
pH: 6 (ref 5.0–8.0)

## 2022-06-29 LAB — POCT URINALYSIS DIPSTICK
Bilirubin, UA: NEGATIVE
Glucose, UA: NEGATIVE
Ketones, UA: NEGATIVE
Nitrite, UA: NEGATIVE
Protein, UA: NEGATIVE
Spec Grav, UA: 1.015 (ref 1.010–1.025)
Urobilinogen, UA: 0.2 E.U./dL
pH, UA: 6 (ref 5.0–8.0)

## 2022-06-29 MED ORDER — AMOXICILLIN-POT CLAVULANATE 875-125 MG PO TABS
1.0000 | ORAL_TABLET | Freq: Two times a day (BID) | ORAL | 0 refills | Status: AC
Start: 2022-06-29 — End: 2022-07-06

## 2022-06-29 MED ORDER — SERTRALINE HCL 100 MG PO TABS
ORAL_TABLET | ORAL | 0 refills | Status: DC
Start: 2022-06-29 — End: 2022-09-23

## 2022-06-29 NOTE — Assessment & Plan Note (Addendum)
Point-of-care urine shows blood, leukocytes.  Concern for infection.  Fortunately patient is afebrile.  Start Augmentin ahead of urine culture.  Encouraged adequate hydration, probiotics.  She will let me know how she is doing

## 2022-06-29 NOTE — Patient Instructions (Addendum)
Please drink plenty of water  I have sent in antibiotic, Augmentin, for you to start ahead of urine culture ( we will get this result in 2-3 days).   Ensure to take probiotics while on antibiotics and also for 2 weeks after completion. This can either be by eating yogurt daily or taking a probiotic supplement over the counter such as Culturelle.It is important to re-colonize the gut with good bacteria and also to prevent any diarrheal infections associated with antibiotic use.    Urinary Tract Infection, Adult  A urinary tract infection (UTI) is an infection of any part of the urinary tract. The urinary tract includes the kidneys, ureters, bladder, and urethra. These organs make, store, and get rid of urine in the body. An upper UTI affects the ureters and kidneys. A lower UTI affects the bladder and urethra. What are the causes? Most urinary tract infections are caused by bacteria in your genital area around your urethra, where urine leaves your body. These bacteria grow and cause inflammation of your urinary tract. What increases the risk? You are more likely to develop this condition if: You have a urinary catheter that stays in place. You are not able to control when you urinate or have a bowel movement (incontinence). You are female and you: Use a spermicide or diaphragm for birth control. Have low estrogen levels. Are pregnant. You have certain genes that increase your risk. You are sexually active. You take antibiotic medicines. You have a condition that causes your flow of urine to slow down, such as: An enlarged prostate, if you are female. Blockage in your urethra. A kidney stone. A nerve condition that affects your bladder control (neurogenic bladder). Not getting enough to drink, or not urinating often. You have certain medical conditions, such as: Diabetes. A weak disease-fighting system (immunesystem). Sickle cell disease. Gout. Spinal cord injury. What are the signs  or symptoms? Symptoms of this condition include: Needing to urinate right away (urgency). Frequent urination. This may include small amounts of urine each time you urinate. Pain or burning with urination. Blood in the urine. Urine that smells bad or unusual. Trouble urinating. Cloudy urine. Vaginal discharge, if you are female. Pain in the abdomen or the lower back. You may also have: Vomiting or a decreased appetite. Confusion. Irritability or tiredness. A fever or chills. Diarrhea. The first symptom in older adults may be confusion. In some cases, they may not have any symptoms until the infection has worsened. How is this diagnosed? This condition is diagnosed based on your medical history and a physical exam. You may also have other tests, including: Urine tests. Blood tests. Tests for STIs (sexually transmitted infections). If you have had more than one UTI, a cystoscopy or imaging studies may be done to determine the cause of the infections. How is this treated? Treatment for this condition includes: Antibiotic medicine. Over-the-counter medicines to treat discomfort. Drinking enough water to stay hydrated. If you have frequent infections or have other conditions such as a kidney stone, you may need to see a health care provider who specializes in the urinary tract (urologist). In rare cases, urinary tract infections can cause sepsis. Sepsis is a life-threatening condition that occurs when the body responds to an infection. Sepsis is treated in the hospital with IV antibiotics, fluids, and other medicines. Follow these instructions at home:  Medicines Take over-the-counter and prescription medicines only as told by your health care provider. If you were prescribed an antibiotic medicine, take it as told  by your health care provider. Do not stop using the antibiotic even if you start to feel better. General instructions Make sure you: Empty your bladder often and  completely. Do not hold urine for long periods of time. Empty your bladder after sex. Wipe from front to back after urinating or having a bowel movement if you are female. Use each tissue only one time when you wipe. Drink enough fluid to keep your urine pale yellow. Keep all follow-up visits. This is important. Contact a health care provider if: Your symptoms do not get better after 1-2 days. Your symptoms go away and then return. Get help right away if: You have severe pain in your back or your lower abdomen. You have a fever or chills. You have nausea or vomiting. Summary A urinary tract infection (UTI) is an infection of any part of the urinary tract, which includes the kidneys, ureters, bladder, and urethra. Most urinary tract infections are caused by bacteria in your genital area. Treatment for this condition often includes antibiotic medicines. If you were prescribed an antibiotic medicine, take it as told by your health care provider. Do not stop using the antibiotic even if you start to feel better. Keep all follow-up visits. This is important. This information is not intended to replace advice given to you by your health care provider. Make sure you discuss any questions you have with your health care provider. Document Revised: 08/18/2019 Document Reviewed: 08/23/2019 Elsevier Patient Education  2024 [employer].

## 2022-06-29 NOTE — Progress Notes (Signed)
Assessment & Plan:  Hematuria, unspecified type Assessment & Plan: Point-of-care urine shows blood, leukocytes.  Concern for infection.  Fortunately patient is afebrile.  Start Augmentin ahead of urine culture.  Encouraged adequate hydration, probiotics.  She will let me know how she is doing  Orders: -     Amoxicillin-Pot Clavulanate; Take 1 tablet by mouth 2 (two) times daily for 7 days.  Dispense: 14 tablet; Refill: 0  UTI symptoms -     Urinalysis, Routine w reflex microscopic -     Urine Culture -     POCT urinalysis dipstick  GAD (generalized anxiety disorder) -     Sertraline HCl; TAKE 1 TABLET(100 MG) BY MOUTH AT BEDTIME  Dispense: 90 tablet; Refill: 0     Return precautions given.   Risks, benefits, and alternatives of the medications and treatment plan prescribed today were discussed, and patient expressed understanding.   Education regarding symptom management and diagnosis given to patient on AVS either electronically or printed.  No follow-ups on file.  Rennie Plowman, FNP  Subjective:    Patient ID: Catherine Young, female    DOB: Feb 23, 1931, 89 y.o.   MRN: [id number]  CC: Catherine Young is a 89 y.o. female who presents today for an acute visit.    HPI: She complains of urinary odor and urine dark in color for 'months'.  She feels overall this symptom has improved  No hematuria, fever, chills, abdominal pain, flank pain. She hasn't taken medication for this.   Appetite has slightly improved over time.   She wears depends due to urinary incontinence.  [place] driver drove her here today.   She has a history of dementia.  Denies acute changes in memory.  Over time memory has worsened and she is looking into [facility] assisted living   History of dementia, hypertension, osteoporosis No recent antibiotics, UTI, prediabetes  GFR 55  She requests refill of Zoloft today.  Allergies: Fosamax [alendronate] and Prednisone Current Outpatient  Medications on File Prior to Visit  Medication Sig Dispense Refill   amLODipine (NORVASC) 2.5 MG tablet TAKE 1 TABLET(2.5 MG) BY MOUTH DAILY 90 tablet 1   clonazePAM (KLONOPIN) 1 MG tablet TAKE 1/2 TABLET(0.5 MG) BY MOUTH TWICE DAILY AS NEEDED FOR ANXIETY 90 tablet 0   donepezil (ARICEPT) 5 MG tablet Take 1 tablet (5 mg total) by mouth at bedtime. 90 tablet 3   Ibuprofen-diphenhydrAMINE HCl (ADVIL PM) 200-25 MG CAPS Take 2 Capfuls by mouth at bedtime.     traZODone (DESYREL) 100 MG tablet TAKE 1 TABLET(50 MG) BY MOUTH AT BEDTIME AS NEEDED FOR SLEEP 30 tablet 3   No current facility-administered medications on file prior to visit.    Review of Systems  Constitutional:  Negative for chills and fever.  Respiratory:  Negative for cough.   Cardiovascular:  Negative for chest pain and palpitations.  Gastrointestinal:  Negative for nausea and vomiting.  Genitourinary:  Negative for flank pain, frequency and vaginal pain.      Objective:    BP 132/80   Pulse 75   Temp 97.7 F (36.5 C) (Oral)   Ht 5\' 4"  (1.626 m)   Wt 110 lb 3.2 oz (50 kg)   SpO2 96%   BMI 18.92 kg/m   BP Readings from Last 3 Encounters:  06/29/22 132/80  04/19/22 120/60  09/10/21 (!) 158/88   Wt Readings from Last 3 Encounters:  06/29/22 110 lb 3.2 oz (50 kg)  04/19/22 112 lb  4 oz (50.9 kg)  09/10/21 109 lb 6 oz (49.6 kg)    Physical Exam Vitals reviewed.  Constitutional:      Appearance: She is well-developed.  Cardiovascular:     Rate and Rhythm: Normal rate and regular rhythm.     Pulses: Normal pulses.     Heart sounds: Normal heart sounds.  Pulmonary:     Effort: Pulmonary effort is normal.     Breath sounds: Normal breath sounds. No wheezing, rhonchi or rales.  Abdominal:     Tenderness: There is no right CVA tenderness or left CVA tenderness.  Musculoskeletal:     Comments: No rash over low back.   Skin:    General: Skin is warm and dry.  Neurological:     Mental Status: She is alert.   Psychiatric:        Speech: Speech normal.        Behavior: Behavior normal.        Thought Content: Thought content normal.

## 2022-07-01 LAB — URINE CULTURE
MICRO NUMBER:: 15044613
SPECIMEN QUALITY:: ADEQUATE

## 2022-07-01 NOTE — Telephone Encounter (Signed)
Do not see where we have received auth can you let me know and I will reach out to patient.

## 2022-07-05 ENCOUNTER — Other Ambulatory Visit (HOSPITAL_COMMUNITY): Payer: Self-pay

## 2022-07-05 NOTE — Telephone Encounter (Signed)
Pt ready for scheduling for Prolia on or after : 07/05/22  Out-of-pocket cost due at time of visit: $10  Primary: [employer] - [employer] Prolia co-insurance: $10 Admin fee co-insurance: 0  Secondary: N/A Prolia co-insurance:  Admin fee co-insurance:   Medical Benefit Details: Date Benefits were checked: 07/05/22 Deductible: no/ Coinsurance: $10/ Admin Fee: no  Prior Auth: approved PA# [id number] Expiration Date: 11/16/20 to 01/24/23   Pharmacy benefit: Copay $50 If patient wants fill through the pharmacy benefit please send prescription to:  [facility] outpatient pharmacy , and include estimated need by date in rx notes. Pharmacy will ship medication directly to the office.  Patient not eligible for Prolia Copay Card. Copay Card can make patient's cost as little as $25. Link to apply: [email or web address]://[email or web address]  ** This summary of benefits is an estimation of the patient's out-of-pocket cost. Exact cost may very based on individual plan coverage.

## 2022-07-06 ENCOUNTER — Encounter: Payer: Self-pay | Admitting: Family

## 2022-07-06 NOTE — Telephone Encounter (Signed)
Called patient she declined to schedule at this time. She is having UTI symptoms and will call us when she is feeling better to set up.

## 2022-07-06 NOTE — Telephone Encounter (Signed)
Please see encounter on 05/16/22

## 2022-07-06 NOTE — Telephone Encounter (Signed)
Duplicate see other message for information.  

## 2022-07-07 ENCOUNTER — Other Ambulatory Visit (INDEPENDENT_AMBULATORY_CARE_PROVIDER_SITE_OTHER): Payer: Medicare PPO

## 2022-07-07 ENCOUNTER — Other Ambulatory Visit: Payer: Self-pay

## 2022-07-07 DIAGNOSIS — R399 Unspecified symptoms and signs involving the genitourinary system: Secondary | ICD-10-CM

## 2022-07-08 LAB — URINALYSIS, ROUTINE W REFLEX MICROSCOPIC
Bilirubin Urine: NEGATIVE
Ketones, ur: NEGATIVE
Leukocytes,Ua: NEGATIVE
Nitrite: NEGATIVE
Specific Gravity, Urine: 1.01 (ref 1.000–1.030)
Total Protein, Urine: NEGATIVE
Urine Glucose: NEGATIVE
Urobilinogen, UA: 0.2 (ref 0.0–1.0)
pH: 6 (ref 5.0–8.0)

## 2022-07-08 LAB — URINE CULTURE
MICRO NUMBER:: 15079239
Result:: NO GROWTH
SPECIMEN QUALITY:: ADEQUATE

## 2022-07-11 ENCOUNTER — Other Ambulatory Visit: Payer: Self-pay | Admitting: Family

## 2022-07-11 ENCOUNTER — Encounter: Payer: Self-pay | Admitting: Family

## 2022-07-11 DIAGNOSIS — R319 Hematuria, unspecified: Secondary | ICD-10-CM

## 2022-07-15 ENCOUNTER — Other Ambulatory Visit: Payer: Self-pay | Admitting: Family Medicine

## 2022-07-15 NOTE — Telephone Encounter (Signed)
Last office visit 03/26/204 for dementia and back pain/fall.  Last refilled:  Tramadol is not on current medication list.  Next Appt: 07/26/22 for follow up dementia.

## 2022-07-25 ENCOUNTER — Ambulatory Visit (INDEPENDENT_AMBULATORY_CARE_PROVIDER_SITE_OTHER): Payer: Medicare PPO

## 2022-07-25 VITALS — Ht 64.0 in | Wt 110.0 lb

## 2022-07-25 DIAGNOSIS — Z0000 Encounter for general adult medical examination without abnormal findings: Secondary | ICD-10-CM | POA: Diagnosis not present

## 2022-07-25 NOTE — Progress Notes (Signed)
Subjective:   Catherine Young is a 89 y.o. female who presents for Medicare Annual (Subsequent) preventive examination.  Visit Complete: Virtual  I connected with  Catherine Young on 07/25/22 by a audio enabled telemedicine application and verified that I am speaking with the correct person using two identifiers.  Patient Location: Home  Provider Location: Home Office  I discussed the limitations of evaluation and management by telemedicine. The patient expressed understanding and agreed to proceed.  Patient Medicare AWV questionnaire was completed by the patient on ; I have confirmed that all information answered by patient is correct and no changes since this date.  Review of Systems     Cardiac Risk Factors include: advanced age (>50men, >9 women);hypertension     Objective:    Today's Vitals   07/25/22 1500  Weight: 110 lb (49.9 kg)  Height: 5\' 4"  (1.626 m)   Body mass index is 18.88 kg/m.     07/25/2022    3:13 PM 06/22/2021   11:05 AM 11/30/2020    9:53 AM 04/01/2020    9:00 AM 11/11/2019    3:23 PM 10/21/2019    3:22 PM 12/18/2018   10:45 AM  Advanced Directives  Does Patient Have a Medical Advance Directive? Yes Yes Yes Yes No No Yes  Type of Estate agent of [place];Living will Healthcare Power of [place];Living will Healthcare Power of [place];Living will Healthcare Power of [place];Living will   Healthcare Power of [place];Living will  Copy of Healthcare Power of Attorney in Chart? No - copy requested No - copy requested  No - copy requested   No - copy requested  Would patient like information on creating a medical advance directive?     No - Patient declined      Current Medications (verified) Outpatient Encounter Medications as of 07/25/2022  Medication Sig   amLODipine (NORVASC) 2.5 MG tablet TAKE 1 TABLET(2.5 MG) BY MOUTH DAILY   clonazePAM (KLONOPIN) 1 MG tablet TAKE 1/2 TABLET(0.5 MG) BY MOUTH TWICE DAILY AS NEEDED FOR  ANXIETY   donepezil (ARICEPT) 5 MG tablet Take 1 tablet (5 mg total) by mouth at bedtime.   Ibuprofen-diphenhydrAMINE HCl (ADVIL PM) 200-25 MG CAPS Take 2 Capfuls by mouth at bedtime.   sertraline (ZOLOFT) 100 MG tablet TAKE 1 TABLET(100 MG) BY MOUTH AT BEDTIME   traMADol (ULTRAM) 50 MG tablet TAKE 1 TABLET(50 MG) BY MOUTH EVERY 12 HOURS AS NEEDED   traZODone (DESYREL) 100 MG tablet TAKE 1 TABLET(50 MG) BY MOUTH AT BEDTIME AS NEEDED FOR SLEEP   No facility-administered encounter medications on file as of 07/25/2022.    Allergies (verified) Fosamax [alendronate] and Prednisone   History: Past Medical History:  Diagnosis Date   Arthritis    Basal cell carcinoma of tragus, left    Cataract    right   Complication of anesthesia    COULD'T MOVE WHEN WAKING UP AFTER SECONT COLON SURGERY   Depression    Hypertension    Intestinal obstruction (HCC)    Osteoporosis    Past Surgical History:  Procedure Laterality Date   ABDOMINAL HYSTERECTOMY     partial, for menorrhagia   APPENDECTOMY     CATARACT EXTRACTION W/PHACO Right 05/25/2017   Procedure: CATARACT EXTRACTION PHACO AND INTRAOCULAR LENS PLACEMENT (IOC);  Surgeon: [name], MD;  Location: [facility] ORS;  Service: Ophthalmology;  Laterality: Right;  fluid pack lot #  [id number]  exp  12/24/2018 [place]   00:39.4 AP%   9.4  CDE    3.71    COLON SURGERY     INTESTINAL BLOCKAGE FROM SCAR TISSUE X 2   IR KYPHO LUMBAR INC FX REDUCE BONE BX UNI/BIL CANNULATION INC/IMAGING  11/30/2020   Family History  Problem Relation Age of Onset   Arthritis Mother    Colon cancer Mother    Cancer Mother        colon   Diabetes Father    Arthritis Sister    Colon cancer Sister    Cancer Sister        colon   Lung cancer Brother    Cancer Brother        lung   Arthritis Sister    Colon cancer Sister    Cancer Sister        colon   Social History   Socioeconomic History   Marital status: Widowed    Spouse name: Not on file   Number of  children: Not on file   Years of education: Not on file   Highest education level: Not on file  Occupational History   Not on file  Tobacco Use   Smoking status: Former    Types: Cigarettes    Quit date: 01/24/1993    Years since quitting: 29.5   Smokeless tobacco: Never   Tobacco comments:    quit 1995  Vaping Use   Vaping Use: Never used  Substance and Sexual Activity   Alcohol use: Yes    Alcohol/week: 7.0 standard drinks of alcohol    Types: 7 Glasses of wine per week    Comment: nightly glass of wine   Drug use: No   Sexual activity: Never  Other Topics Concern   Not on file  Social History Narrative   Not on file   Social Determinants of Health   Financial Resource Strain: Low Risk  (07/25/2022)   Overall Financial Resource Strain (CARDIA)    Difficulty of Paying Living Expenses: Not hard at all  Food Insecurity: No Food Insecurity (07/25/2022)   Hunger Vital Sign    Worried About Running Out of Food in the Last Year: Never true    Ran Out of Food in the Last Year: Never true  Transportation Needs: No Transportation Needs (07/25/2022)   PRAPARE - Administrator, Civil Service (Medical): No    Lack of Transportation (Non-Medical): No  Physical Activity: Insufficiently Active (07/25/2022)   Exercise Vital Sign    Days of Exercise per Week: 1 day    Minutes of Exercise per Session: 40 min  Stress: No Stress Concern Present (07/25/2022)   [employer] of Occupational Health - Occupational Stress Questionnaire    Feeling of Stress : Not at all  Social Connections: Socially Isolated (07/25/2022)   Social Connection and Isolation Panel [NHANES]    Frequency of Communication with Friends and Family: More than three times a week    Frequency of Social Gatherings with Friends and Family: More than three times a week    Attends Religious Services: Never    Database administrator or Organizations: No    Attends Banker Meetings: Never    Marital Status:  Widowed    Tobacco Counseling Counseling given: Not Answered Tobacco comments: quit 1995   Clinical Intake:  Pre-visit preparation completed: No  Pain : No/denies pain     BMI - recorded: 18.88 Nutritional Status: BMI <19  Underweight Nutritional Risks: None Diabetes: No  How often do you need to  have someone help you when you read instructions, pamphlets, or other written materials from your doctor or pharmacy?: 1 - Never  Interpreter Needed?: No  Information entered by :: Theresa Mulligan LPN   Activities of Daily Living    07/25/2022    3:09 PM  In your present state of health, do you have any difficulty performing the following activities:  Hearing? 0  Vision? 0  Difficulty concentrating or making decisions? 0  Walking or climbing stairs? 1  Comment Uses a cane  Dressing or bathing? 0  Doing errands, shopping? 0  Preparing Food and eating ? N  Using the Toilet? N  In the past six months, have you accidently leaked urine? Y  Comment Wears breifs. Followed by PCP  Do you have problems with loss of bowel control? Y  Comment Wears breifs. Followed by PCP  Managing your Medications? N  Managing your Finances? N  Housekeeping or managing your Housekeeping? N    Patient Care Team: Excell Seltzer, MD as PCP - General (Family Medicine) [name], MD as Consulting Physician (Ophthalmology)  Indicate any recent Medical Services you may have received from other than [facility] providers in the past year (date may be approximate).     Assessment:   This is a routine wellness examination for [facility]  Hearing/Vision screen Hearing Screening - Comments:: Denies hearing difficulties   Vision Screening - Comments:: Wears rx glasses - up to date with routine eye exams with  [facility]  Dietary issues and exercise activities discussed:     Goals Addressed               This Visit's Progress     Stay Healthy (pt-stated)         Depression  Screen    07/25/2022    3:07 PM 06/29/2022    9:32 AM 04/19/2022   11:28 AM 08/03/2021    5:21 PM 06/22/2021   11:06 AM 11/03/2020   12:16 PM 04/01/2020    9:03 AM  PHQ 2/9 Scores  PHQ - 2 Score 0 0 4 3 0 0 0  PHQ- 9 Score 0 0 10 12  0 0    Fall Risk    07/25/2022    3:13 PM 06/29/2022    9:31 AM 04/19/2022   11:28 AM 06/22/2021   11:05 AM 03/23/2021    2:57 PM  Fall Risk   Falls in the past year? 1 0 1 0 0  Number falls in past yr: 0 0 1 0 0  Injury with Fall? 0 0 0 0 0  Risk for fall due to : No Fall Risks No Fall Risks History of fall(s);Impaired mobility Medication side effect No Fall Risks  Follow up Falls prevention discussed Falls evaluation completed Falls evaluation completed Falls evaluation completed;Education provided;Falls prevention discussed Falls evaluation completed    MEDICARE RISK AT HOME:  Medicare Risk at Home - 07/25/22 1518     Any stairs in or around the home? No    If so, are there any without handrails? No    Home free of loose throw rugs in walkways, pet beds, electrical cords, etc? Yes    Adequate lighting in your home to reduce risk of falls? Yes    Life alert? No    Use of a cane, walker or w/c? No    Grab bars in the bathroom? Yes    Shower chair or bench in shower? No    Elevated toilet  seat or a handicapped toilet? No             TIMED UP AND GO:  Was the test performed?  No    Cognitive Function:    04/01/2020    9:10 AM 12/18/2018   10:58 AM 12/13/2017    1:57 PM 11/22/2016   11:10 AM  MMSE - Mini Mental State Exam  Orientation to time 5 5 5 5   Orientation to Place 5 5 5 5   Registration 3 3 3 3   Attention/ Calculation 5 5 0 0  Recall 3 3 3 3   Language- name 2 objects   0 0  Language- repeat 1 1 1 1   Language- follow 3 step command   3 2  Language- follow 3 step command-comments    unable to follow 1 step of 3 step command  Language- read & follow direction   0 0  Write a sentence   0 0  Copy design   0 0  Total score   20 19         07/25/2022    3:14 PM 06/22/2021   11:08 AM  6CIT Screen  What Year? 0 points 0 points  What month? 0 points 0 points  What time? 0 points 0 points  Count back from 20 0 points 0 points  Months in reverse 0 points 0 points  Repeat phrase 0 points 0 points  Total Score 0 points 0 points    Immunizations Immunization History  Administered Date(s) Administered   Fluad Quad(high Dose 65+) 10/23/2018, 10/25/2019   Influenza,inj,quad, With Preservative 11/10/2016   Influenza-Unspecified 11/17/2016, 10/24/2017   Moderna Sars-Covid-2 Vaccination 02/08/2019, 05/14/2019, 11/13/2019   PFIZER(Purple Top)SARS-COV-2 Vaccination 08/05/2020   Pneumococcal Conjugate-13 10/25/2011, 11/22/2016   Pneumococcal Polysaccharide-23 12/13/2017    TDAP status: Due, Education has been provided regarding the importance of this vaccine. Advised may receive this vaccine at local pharmacy or Health Dept. Aware to provide a copy of the vaccination record if obtained from local pharmacy or Health Dept. Verbalized acceptance and understanding.  Flu Vaccine status: Declined, Education has been provided regarding the importance of this vaccine but patient still declined. Advised may receive this vaccine at local pharmacy or Health Dept. Aware to provide a copy of the vaccination record if obtained from local pharmacy or Health Dept. Verbalized acceptance and understanding.  Pneumococcal vaccine status: Up to date  Covid-19 vaccine status: Completed vaccines  Qualifies for Shingles Vaccine? Yes   Zostavax completed No   Shingrix Completed?: No.    Education has been provided regarding the importance of this vaccine. Patient has been advised to call insurance company to determine out of pocket expense if they have not yet received this vaccine. Advised may also receive vaccine at local pharmacy or Health Dept. Verbalized acceptance and understanding.  Screening Tests Health Maintenance  Topic Date Due    DTaP/Tdap/Td (1 - Tdap) Never done   COVID-19 Vaccine (5 - 2023-24 season) 08/10/2022 (Originally 09/24/2021)   Zoster Vaccines- Shingrix (1 of 2) 10/25/2022 (Originally 10/17/1950)   INFLUENZA VACCINE  08/25/2022   Medicare Annual Wellness (AWV)  07/25/2023   Pneumonia Vaccine 60+ Years old  Completed   DEXA SCAN  Completed   HPV VACCINES  Aged Out    Health Maintenance  Health Maintenance Due  Topic Date Due   DTaP/Tdap/Td (1 - Tdap) Never done    Colorectal cancer screening: No longer required.   Mammogram status: No longer required due to Age.  Bone Density status: Completed 12/20/16. Results reflect: Bone density results: OSTEOPOROSIS. Repeat every   years.  Lung Cancer Screening: (Low Dose CT Chest recommended if Age 53-80 years, 20 pack-year currently smoking OR have quit w/in 15years.) does not qualify.     Additional Screening:  Hepatitis C Screening: does not qualify; Completed   Vision Screening: Recommended annual ophthalmology exams for early detection of glaucoma and other disorders of the eye. Is the patient up to date with their annual eye exam?  Yes  Who is the provider or what is the name of the office in which the patient attends annual eye exams? [facility] If pt is not established with a provider, would they like to be referred to a provider to establish care? No .   Dental Screening: Recommended annual dental exams for proper oral hygiene    Community Resource Referral / Chronic Care Management:  CRR required this visit?  No   CCM required this visit?  No     Plan:     I have personally reviewed and noted the following in the patient's chart:   Medical and social history Use of alcohol, tobacco or illicit drugs  Current medications and supplements including opioid prescriptions. Patient is currently taking opioid prescriptions. Information provided to patient regarding non-opioid alternatives. Patient advised to discuss non-opioid  treatment plan with their provider. Functional ability and status Nutritional status Physical activity Advanced directives List of other physicians Hospitalizations, surgeries, and ER visits in previous 12 months Vitals Screenings to include cognitive, depression, and falls Referrals and appointments  In addition, I have reviewed and discussed with patient certain preventive protocols, quality metrics, and best practice recommendations. A written personalized care plan for preventive services as well as general preventive health recommendations were provided to patient.     Tillie Rung, LPN   08/30/5782   After Visit Summary: (MyChart) Due to this being a telephonic visit, the after visit summary with patients personalized plan was offered to patient via MyChart   Nurse Notes: None

## 2022-07-25 NOTE — Patient Instructions (Addendum)
Catherine Young , Thank you for taking time to come for your Medicare Wellness Visit. I appreciate your ongoing commitment to your health goals. Please review the following plan we discussed and let me know if I can assist you in the future.   These are the goals we discussed:  Goals       Health Management      Starting 12/13/2017, I will continue to do water aerobics for 60 min 1 day per week and to drink at least 32 oz of water daily.       Patient Stated      12/18/2018, I will maintain and continue medications as prescribed.       Patient Stated      04/01/2020, I will maintain and continue medications as prescribed.       Patient Stated      06/22/2021, no goals      Stay Healthy (pt-stated)        This is a list of the screening recommended for you and due dates:  Health Maintenance  Topic Date Due   DTaP/Tdap/Td vaccine (1 - Tdap) Never done   COVID-19 Vaccine (5 - 2023-24 season) 08/10/2022*   Zoster (Shingles) Vaccine (1 of 2) 10/25/2022*   Flu Shot  08/25/2022   Medicare Annual Wellness Visit  07/25/2023   Pneumonia Vaccine  Completed   DEXA scan (bone density measurement)  Completed   HPV Vaccine  Aged Out  *Topic was postponed. The date shown is not the original due date.  Opioid Pain Medicine Management Opioids are powerful medicines that are used to treat moderate to severe pain. When used for short periods of time, they can help you to: Sleep better. Do better in physical or occupational therapy. Feel better in the first few days after an injury. Recover from surgery. Opioids should be taken with the supervision of a trained health care provider. They should be taken for the shortest period of time possible. This is because opioids can be addictive, and the longer you take opioids, the greater your risk of addiction. This addiction can also be called opioid use disorder. What are the risks? Using opioid pain medicines for longer than 3 days increases your risk of  side effects. Side effects include: Constipation. Nausea and vomiting. Breathing difficulties (respiratory depression). Drowsiness. Confusion. Opioid use disorder. Itching. Taking opioid pain medicine for a long period of time can affect your ability to do daily tasks. It also puts you at risk for: Motor vehicle crashes. Depression. Suicide. Heart attack. Overdose, which can be life-threatening. What is a pain treatment plan? A pain treatment plan is an agreement between you and your health care provider. Pain is unique to each person, and treatments vary depending on your condition. To manage your pain, you and your health care provider need to work together. To help you do this: Discuss the goals of your treatment, including how much pain you might expect to have and how you will manage the pain. Review the risks and benefits of taking opioid medicines. Remember that a good treatment plan uses more than one approach and minimizes the chance of side effects. Be honest about the amount of medicines you take and about any drug or alcohol use. Get pain medicine prescriptions from only one health care provider. Pain can be managed with many types of alternative treatments. Ask your health care provider to refer you to one or more specialists who can help you manage pain through: Physical or  occupational therapy. Counseling (cognitive behavioral therapy). Good nutrition. Biofeedback. Massage. Meditation. Non-opioid medicine. Following a gentle exercise program. How to use opioid pain medicine Taking medicine Take your pain medicine exactly as told by your health care provider. Take it only when you need it. If your pain gets less severe, you may take less than your prescribed dose if your health care provider approves. If you are not having pain, do nottake pain medicine unless your health care provider tells you to take it. If your pain is severe, do nottry to treat it yourself by  taking more pills than instructed on your prescription. Contact your health care provider for help. Write down the times when you take your pain medicine. It is easy to become confused while on pain medicine. Writing the time can help you avoid overdose. Take other over-the-counter or prescription medicines only as told by your health care provider. Keeping yourself and others safe  While you are taking opioid pain medicine: Do not drive, use machinery, or power tools. Do not sign legal documents. Do not drink alcohol. Do not take sleeping pills. Do not supervise children by yourself. Do not do activities that require climbing or being in high places. Do not go to a lake, river, ocean, spa, or swimming pool. Do not share your pain medicine with anyone. Keep pain medicine in a locked cabinet or in a secure area where pets and children cannot reach it. Stopping your use of opioids If you have been taking opioid medicine for more than a few weeks, you may need to slowly decrease (taper) how much you take until you stop completely. Tapering your use of opioids can decrease your risk of symptoms of withdrawal, such as: Pain and cramping in the abdomen. Nausea. Sweating. Sleepiness. Restlessness. Uncontrollable shaking (tremors). Cravings for the medicine. Do not attempt to taper your use of opioids on your own. Talk with your health care provider about how to do this. Your health care provider may prescribe a step-down schedule based on how much medicine you are taking and how long you have been taking it. Getting rid of leftover pills Do not save any leftover pills. Get rid of leftover pills safely by: Taking the medicine to a prescription take-back program. This is usually offered by the county or law enforcement. Bringing them to a pharmacy that has a drug disposal container. Flushing them down the toilet. Check the label or package insert of your medicine to see whether this is safe to  do. Throwing them out in the trash. Check the label or package insert of your medicine to see whether this is safe to do. If it is safe to throw it out, remove the medicine from the original container, put it into a sealable bag or container, and mix it with used coffee grounds, food scraps, dirt, or cat litter before putting it in the trash. Follow these instructions at home: Activity Do exercises as told by your health care provider. Avoid activities that make your pain worse. Return to your normal activities as told by your health care provider. Ask your health care provider what activities are safe for you. General instructions You may need to take these actions to prevent or treat constipation: Drink enough fluid to keep your urine pale yellow. Take over-the-counter or prescription medicines. Eat foods that are high in fiber, such as beans, whole grains, and fresh fruits and vegetables. Limit foods that are high in fat and processed sugars, such as fried or sweet  foods. Keep all follow-up visits. This is important. Where to find support If you have been taking opioids for a long time, you may benefit from receiving support for quitting from a local support group or counselor. Ask your health care provider for a referral to these resources in your area. Where to find more information Centers for Disease Control and Prevention (CDC): FootballExhibition.com.br [place] Food and Drug Administration (FDA): PumpkinSearch.com.ee Get help right away if: You may have taken too much of an opioid (overdosed). Common symptoms of an overdose: Your breathing is slower or more shallow than normal. You have a very slow heartbeat (pulse). You have slurred speech. You have nausea and vomiting. Your pupils become very small. You have other potential symptoms: You are very confused. You faint or feel like you will faint. You have cold, clammy skin. You have blue lips or fingernails. You have thoughts of harming yourself or  harming others. These symptoms may represent a serious problem that is an emergency. Do not wait to see if the symptoms will go away. Get medical help right away. Call your local emergency services (911 in the [place] Do not drive yourself to the hospital.  If you ever feel like you may hurt yourself or others, or have thoughts about taking your own life, get help right away. Go to your nearest emergency department or: Call your local emergency services (911 in the [place] Call the [facility] (901-285-5574 in the [place] Call a suicide crisis helpline, such as the National Suicide Prevention Lifeline at 651-812-0035 or 988 in the [place] This is open 24 hours a day in the [place] Text the Crisis Text Line at 762-675-7074 (in the [place] Summary Opioid medicines can help you manage moderate to severe pain for a short period of time. A pain treatment plan is an agreement between you and your health care provider. Discuss the goals of your treatment, including how much pain you might expect to have and how you will manage the pain. If you think that you or someone else may have taken too much of an opioid, get medical help right away. This information is not intended to replace advice given to you by your health care provider. Make sure you discuss any questions you have with your health care provider. Document Revised: 08/05/2020 Document Reviewed: 04/22/2020 Elsevier Patient Education  2024 [employer]   Advanced directives: Please bring a copy of your health care power of attorney and living will to the office to be added to your chart at your convenience.   Conditions/risks identified: None  Next appointment: Follow up in one year for your annual wellness visit    Preventive Care 65 Years and Older, Female Preventive care refers to lifestyle choices and visits with your health care provider that can promote health and wellness. What does preventive care include? A yearly  physical exam. This is also called an annual well check. Dental exams once or twice a year. Routine eye exams. Ask your health care provider how often you should have your eyes checked. Personal lifestyle choices, including: Daily care of your teeth and gums. Regular physical activity. Eating a healthy diet. Avoiding tobacco and drug use. Limiting alcohol use. Practicing safe sex. Taking low-dose aspirin every day. Taking vitamin and mineral supplements as recommended by your health care provider. What happens during an annual well check? The services and screenings done by your health care provider during your annual well check will depend on your age, overall health,  lifestyle risk factors, and family history of disease. Counseling  Your health care provider may ask you questions about your: Alcohol use. Tobacco use. Drug use. Emotional well-being. Home and relationship well-being. Sexual activity. Eating habits. History of falls. Memory and ability to understand (cognition). Work and work Astronomer. Reproductive health. Screening  You may have the following tests or measurements: Height, weight, and BMI. Blood pressure. Lipid and cholesterol levels. These may be checked every 5 years, or more frequently if you are over 39 years old. Skin check. Lung cancer screening. You may have this screening every year starting at age 80 if you have a 30-pack-year history of smoking and currently smoke or have quit within the past 15 years. Fecal occult blood test (FOBT) of the stool. You may have this test every year starting at age 7. Flexible sigmoidoscopy or colonoscopy. You may have a sigmoidoscopy every 5 years or a colonoscopy every 10 years starting at age 36. Hepatitis C blood test. Hepatitis B blood test. Sexually transmitted disease (STD) testing. Diabetes screening. This is done by checking your blood sugar (glucose) after you have not eaten for a while (fasting). You may  have this done every 1-3 years. Bone density scan. This is done to screen for osteoporosis. You may have this done starting at age 80. Mammogram. This may be done every 1-2 years. Talk to your health care provider about how often you should have regular mammograms. Talk with your health care provider about your test results, treatment options, and if necessary, the need for more tests. Vaccines  Your health care provider may recommend certain vaccines, such as: Influenza vaccine. This is recommended every year. Tetanus, diphtheria, and acellular pertussis (Tdap, Td) vaccine. You may need a Td booster every 10 years. Zoster vaccine. You may need this after age 64. Pneumococcal 13-valent conjugate (PCV13) vaccine. One dose is recommended after age 15. Pneumococcal polysaccharide (PPSV23) vaccine. One dose is recommended after age 64. Talk to your health care provider about which screenings and vaccines you need and how often you need them. This information is not intended to replace advice given to you by your health care provider. Make sure you discuss any questions you have with your health care provider. Document Released: 02/06/2015 Document Revised: 09/30/2015 Document Reviewed: 11/11/2014 Elsevier Interactive Patient Education  2017 [employer].  Fall Prevention in the Home Falls can cause injuries. They can happen to people of all ages. There are many things you can do to make your home safe and to help prevent falls. What can I do on the outside of my home? Regularly fix the edges of walkways and driveways and fix any cracks. Remove anything that might make you trip as you walk through a door, such as a raised step or threshold. Trim any bushes or trees on the path to your home. Use bright outdoor lighting. Clear any walking paths of anything that might make someone trip, such as rocks or tools. Regularly check to see if handrails are loose or broken. Make sure that both sides of any  steps have handrails. Any raised decks and porches should have guardrails on the edges. Have any leaves, snow, or ice cleared regularly. Use sand or salt on walking paths during winter. Clean up any spills in your garage right away. This includes oil or grease spills. What can I do in the bathroom? Use night lights. Install grab bars by the toilet and in the tub and shower. Do not use towel bars as  grab bars. Use non-skid mats or decals in the tub or shower. If you need to sit down in the shower, use a plastic, non-slip stool. Keep the floor dry. Clean up any water that spills on the floor as soon as it happens. Remove soap buildup in the tub or shower regularly. Attach bath mats securely with double-sided non-slip rug tape. Do not have throw rugs and other things on the floor that can make you trip. What can I do in the bedroom? Use night lights. Make sure that you have a light by your bed that is easy to reach. Do not use any sheets or blankets that are too big for your bed. They should not hang down onto the floor. Have a firm chair that has side arms. You can use this for support while you get dressed. Do not have throw rugs and other things on the floor that can make you trip. What can I do in the kitchen? Clean up any spills right away. Avoid walking on wet floors. Keep items that you use a lot in easy-to-reach places. If you need to reach something above you, use a strong step stool that has a grab bar. Keep electrical cords out of the way. Do not use floor polish or wax that makes floors slippery. If you must use wax, use non-skid floor wax. Do not have throw rugs and other things on the floor that can make you trip. What can I do with my stairs? Do not leave any items on the stairs. Make sure that there are handrails on both sides of the stairs and use them. Fix handrails that are broken or loose. Make sure that handrails are as long as the stairways. Check any carpeting to  make sure that it is firmly attached to the stairs. Fix any carpet that is loose or worn. Avoid having throw rugs at the top or bottom of the stairs. If you do have throw rugs, attach them to the floor with carpet tape. Make sure that you have a light switch at the top of the stairs and the bottom of the stairs. If you do not have them, ask someone to add them for you. What else can I do to help prevent falls? Wear shoes that: Do not have high heels. Have rubber bottoms. Are comfortable and fit you well. Are closed at the toe. Do not wear sandals. If you use a stepladder: Make sure that it is fully opened. Do not climb a closed stepladder. Make sure that both sides of the stepladder are locked into place. Ask someone to hold it for you, if possible. Clearly mark and make sure that you can see: Any grab bars or handrails. First and last steps. Where the edge of each step is. Use tools that help you move around (mobility aids) if they are needed. These include: Canes. Walkers. Scooters. Crutches. Turn on the lights when you go into a dark area. Replace any light bulbs as soon as they burn out. Set up your furniture so you have a clear path. Avoid moving your furniture around. If any of your floors are uneven, fix them. If there are any pets around you, be aware of where they are. Review your medicines with your doctor. Some medicines can make you feel dizzy. This can increase your chance of falling. Ask your doctor what other things that you can do to help prevent falls. This information is not intended to replace advice given to you by your  health care provider. Make sure you discuss any questions you have with your health care provider. Document Released: 11/06/2008 Document Revised: 06/18/2015 Document Reviewed: 02/14/2014 Elsevier Interactive Patient Education  2017 [employer].

## 2022-07-26 ENCOUNTER — Other Ambulatory Visit: Payer: Self-pay | Admitting: Family Medicine

## 2022-07-26 ENCOUNTER — Ambulatory Visit: Payer: Medicare PPO | Admitting: Family Medicine

## 2022-07-26 ENCOUNTER — Encounter: Payer: Self-pay | Admitting: Family Medicine

## 2022-07-26 VITALS — BP 126/70 | Temp 98.1°F | Ht 64.0 in | Wt 110.2 lb

## 2022-07-26 DIAGNOSIS — M48061 Spinal stenosis, lumbar region without neurogenic claudication: Secondary | ICD-10-CM | POA: Diagnosis not present

## 2022-07-26 DIAGNOSIS — F03A4 Unspecified dementia, mild, with anxiety: Secondary | ICD-10-CM

## 2022-07-26 DIAGNOSIS — M810 Age-related osteoporosis without current pathological fracture: Secondary | ICD-10-CM | POA: Diagnosis not present

## 2022-07-26 DIAGNOSIS — R197 Diarrhea, unspecified: Secondary | ICD-10-CM

## 2022-07-26 LAB — BASIC METABOLIC PANEL
BUN: 17 mg/dL (ref 6–23)
CO2: 30 mEq/L (ref 19–32)
Calcium: 9.6 mg/dL (ref 8.4–10.5)
Chloride: 97 mEq/L (ref 96–112)
Creatinine, Ser: 0.93 mg/dL (ref 0.40–1.20)
GFR: 54.78 mL/min — ABNORMAL LOW (ref >60.00)
Glucose, Bld: 93 mg/dL (ref 70–99)
Potassium: 4.4 mEq/L (ref 3.5–5.1)
Sodium: 136 mEq/L (ref 135–145)

## 2022-07-26 LAB — VITAMIN D 25 HYDROXY (VIT D DEFICIENCY, FRACTURES): VITD: 20.84 ng/mL — ABNORMAL LOW (ref 30.00–100.00)

## 2022-07-26 MED ORDER — CLONAZEPAM 1 MG PO TABS: ORAL_TABLET | ORAL | 0 refills | Status: DC

## 2022-07-26 MED ORDER — VITAMIN D (ERGOCALCIFEROL) 1.25 MG (50000 UNIT) PO CAPS: 50000.0000 [IU] | ORAL_CAPSULE | ORAL | 0 refills | Status: AC

## 2022-07-26 MED ORDER — DONEPEZIL HCL 10 MG PO TABS: 10.0000 mg | ORAL_TABLET | Freq: Every day | ORAL | 3 refills | Status: AC

## 2022-07-26 NOTE — Patient Instructions (Signed)
Increase Aricept to 10 mg daily.  If diarrhea returns and worsens go ahead and hold the Aricept to see if this medication could be causing your symptoms. Keep up with fluids. Continue using tramadol twice daily as needed for pain.

## 2022-07-26 NOTE — Progress Notes (Signed)
Patient ID: Catherine Young, female    DOB: 26-Dec-1931, 89 y.o.   MRN: [id number]  This visit was conducted in person.  BP 126/70 (BP Location: Right Arm, Patient Position: Sitting, Cuff Size: Normal)   Temp 98.1 F (36.7 C) (Temporal)   Ht 5\' 4"  (1.626 m)   Wt 110 lb 4 oz (50 kg)   BMI 18.92 kg/m    CC:  Chief Complaint  Patient presents with  . Dementia    Follow up Aricept  . Spinal Stenosis    Follow up Tramadol    Subjective:   HPI: Catherine Young is a 89 y.o. female presenting on 07/26/2022 for Dementia (Follow up Aricept) and Spinal Stenosis (Follow up Tramadol)  Reviewed past office visit notes.  Memory loss evaluated in 2022 Assessment as follows: Memory loss.. gradually worsening in last year. Forgetting where she puts stuff, short term memory.  No issue with remembering pills.  3 Sisters with Alzheimers.   UPDATE: normal thyroid, vitamins, CMET, etc. MMSE 29/30 At that time felt possible early Alzheimer's but she did not wish to use Aricept as it was not helpful in her sisters.   At last OV 05/08/2022 she was started on Aricept 5 mg daily At that time Mini-Mental status exam was 29 out of 30, animal recall 6  Also at that time restarted tramadol for chronic leg pain from spinal stenosis.  Today she reports  no SE to Aricept.  She feels there has no been a huge worsening.  She has not had anyone note her memory has been worsening.   She is planning on moving from apartment to assisted living at [facility]   She is using tramadol 50 mg twice daily for chronic pain. She states the tramadol takes pain level from 10 down to 5   She is sleeping well.     Suing clonazepam 1/2 tablet twice daily as needed for anxiety... she states this helps a lot.   She has been having diarrhea, and surprise bowel movements in the night and 2-3 times a day. Now in last week, no bowel movement. Here in the last week it is kind that you does no bowel movement at all   No new med changes or diet changes.  Wt Readings from Last 3 Encounters:  07/26/22 110 lb 4 oz (50 kg)  07/25/22 110 lb (49.9 kg)  06/29/22 110 lb 3.2 oz (50 kg)    Relevant past medical, surgical, family and social history reviewed and updated as indicated. Interim medical history since our last visit reviewed. Allergies and medications reviewed and updated. Outpatient Medications Prior to Visit  Medication Sig Dispense Refill  . amLODipine (NORVASC) 2.5 MG tablet TAKE 1 TABLET(2.5 MG) BY MOUTH DAILY 90 tablet 1  . clonazePAM (KLONOPIN) 1 MG tablet TAKE 1/2 TABLET(0.5 MG) BY MOUTH TWICE DAILY AS NEEDED FOR ANXIETY 90 tablet 0  . donepezil (ARICEPT) 5 MG tablet Take 1 tablet (5 mg total) by mouth at bedtime. 90 tablet 3  . Ibuprofen-diphenhydrAMINE HCl (ADVIL PM) 200-25 MG CAPS Take 2 Capfuls by mouth at bedtime.    . sertraline (ZOLOFT) 100 MG tablet TAKE 1 TABLET(100 MG) BY MOUTH AT BEDTIME 90 tablet 0  . traMADol (ULTRAM) 50 MG tablet TAKE 1 TABLET(50 MG) BY MOUTH EVERY 12 HOURS AS NEEDED 60 tablet 0  . traZODone (DESYREL) 100 MG tablet TAKE 1 TABLET(50 MG) BY MOUTH AT BEDTIME AS NEEDED FOR SLEEP 30 tablet 3  No facility-administered medications prior to visit.     Per HPI unless specifically indicated in ROS section below Review of Systems  Constitutional:  Negative for fatigue and fever.  HENT:  Negative for congestion.   Eyes:  Negative for pain.  Respiratory:  Negative for cough and shortness of breath.   Cardiovascular:  Negative for chest pain, palpitations and leg swelling.  Gastrointestinal:  Negative for abdominal pain.  Genitourinary:  Negative for dysuria and vaginal bleeding.  Musculoskeletal:  Positive for back pain and gait problem.  Neurological:  Positive for dizziness. Negative for syncope, light-headedness and headaches.  Psychiatric/Behavioral:  Negative for dysphoric mood.    Objective:  BP 126/70 (BP Location: Right Arm, Patient Position: Sitting, Cuff  Size: Normal)   Temp 98.1 F (36.7 C) (Temporal)   Ht 5\' 4"  (1.626 m)   Wt 110 lb 4 oz (50 kg)   BMI 18.92 kg/m   Wt Readings from Last 3 Encounters:  07/26/22 110 lb 4 oz (50 kg)  07/25/22 110 lb (49.9 kg)  06/29/22 110 lb 3.2 oz (50 kg)      Physical Exam Constitutional:      General: She is not in acute distress.    Appearance: Normal appearance. She is well-developed. She is not ill-appearing or toxic-appearing.  HENT:     Head: Normocephalic.     Right Ear: Hearing, tympanic membrane, ear canal and external ear normal. Tympanic membrane is not erythematous, retracted or bulging.     Left Ear: Hearing, tympanic membrane, ear canal and external ear normal. Tympanic membrane is not erythematous, retracted or bulging.     Nose: No mucosal edema or rhinorrhea.     Right Sinus: No maxillary sinus tenderness or frontal sinus tenderness.     Left Sinus: No maxillary sinus tenderness or frontal sinus tenderness.     Mouth/Throat:     Pharynx: Uvula midline.  Eyes:     General: Lids are normal. Lids are everted, no foreign bodies appreciated.     Conjunctiva/sclera: Conjunctivae normal.     Pupils: Pupils are equal, round, and reactive to light.  Neck:     Thyroid: No thyroid mass or thyromegaly.     Vascular: No carotid bruit.     Trachea: Trachea normal.  Cardiovascular:     Rate and Rhythm: Normal rate and regular rhythm.     Pulses: Normal pulses.          Dorsalis pedis pulses are 2+ on the right side and 2+ on the left side.       Posterior tibial pulses are 2+ on the right side and 2+ on the left side.     Heart sounds: Normal heart sounds, S1 normal and S2 normal. No murmur heard.    No friction rub. No gallop.  Pulmonary:     Effort: Pulmonary effort is normal. No tachypnea or respiratory distress.     Breath sounds: Normal breath sounds. No decreased breath sounds, wheezing, rhonchi or rales.  Abdominal:     General: Bowel sounds are normal.     Palpations: Abdomen  is soft.     Tenderness: There is no abdominal tenderness.  Musculoskeletal:     Cervical back: Normal range of motion and neck supple. No tenderness or bony tenderness. No pain with movement.     Thoracic back: Tenderness present. No bony tenderness. Decreased range of motion.     Lumbar back: Tenderness present. No bony tenderness. Decreased range of motion. Negative right  straight leg raise test and negative left straight leg raise test. Scoliosis present.     Right hip: No tenderness or bony tenderness. Normal range of motion.     Left hip: No tenderness or bony tenderness. Normal range of motion.     Comments: Kyphosis  Skin:    General: Skin is warm and dry.     Findings: No rash.  Neurological:     Mental Status: She is alert.  Psychiatric:        Mood and Affect: Mood is not anxious or depressed.        Speech: Speech normal.        Behavior: Behavior normal. Behavior is cooperative.        Thought Content: Thought content normal.        Judgment: Judgment normal.      Results for orders placed or performed in visit on 07/07/22  Urine Culture   Specimen: Urine  Result Value Ref Range   MICRO NUMBER: [id number]    SPECIMEN QUALITY: Adequate    Sample Source URINE    STATUS: FINAL    Result: No Growth   Urinalysis, Routine w reflex microscopic  Result Value Ref Range   Color, Urine YELLOW Yellow;Lt. Yellow;Straw;Dark Yellow;Amber;Green;Red;Brown   APPearance CLEAR Clear;Turbid;Slightly Cloudy;Cloudy   Specific Gravity, Urine 1.010 1.000 - 1.030   pH 6.0 5.0 - 8.0   Total Protein, Urine NEGATIVE Negative   Urine Glucose NEGATIVE Negative   Ketones, ur NEGATIVE Negative   Bilirubin Urine NEGATIVE Negative   Hgb urine dipstick MODERATE (A) Negative   Urobilinogen, UA 0.2 0.0 - 1.0   Leukocytes,Ua NEGATIVE Negative   Nitrite NEGATIVE Negative   WBC, UA 0-2/hpf 0-2/hpf   RBC / HPF 3-6/hpf (A) 0-2/hpf   Renal Epithel, UA Rare(0-4/hpf) (A) None    Assessment and  Plan  There are no diagnoses linked to this encounter.   No follow-ups on file.   Kerby Nora, MD

## 2022-07-27 NOTE — Assessment & Plan Note (Signed)
Chronic pain, tolerable control with tramadol 50 mg p.o. twice daily as needed pain.  She denies any oversedation with this medication.  We discussed considering higher dose in the morning given that is where most of her pain is versus midday dose.  She prefers to keep her medication regimen as it is currently.

## 2022-07-27 NOTE — Assessment & Plan Note (Signed)
Chronic for several months, now resolved in the last week.  She does have some episodes of stool incontinence that are part of what is driving her move to higher level of care. Possible IBS as she fluctuates back-and-forth from constipation to diarrhea.  Discussed fiber and probiotic.  We also discussed whether could possibly be a side effect of Aricept. No clear infectious symptoms.  We discussed keeping up with fluid and electrolyte intake.

## 2022-07-27 NOTE — Assessment & Plan Note (Addendum)
Chronic, slight decline. Increase Aricept to 10 mg daily.  If diarrhea returns and worsens go ahead and hold the Aricept to see if this medication could be causing your symptoms.  She is looking into higher level care at [facility], likely in the assisted living.  She is functioning fairly well on her own now except for when she has issues with excessive stooling.  Form was completed as requested although she does not foresee move at the moment.

## 2022-07-29 NOTE — Telephone Encounter (Signed)
Called patient reviewed all following information including appointment, Co pay due at time of visit and if pick up of injection is needed from outside pharmacy.     Out of pocket for patient:  $10  Lab appointment : 07/26/22  Nurse visit:  08/16/22  Lab order placed: Yes has had labs and results reviewed   Prolia has been  [x]   Ordered in stock   []   Script sent to local pharmacy for patient to bring   []   Script sent to [facility] pharmacy   []   Script sent to [facility] [employer] to deliver

## 2022-07-29 NOTE — Telephone Encounter (Signed)
Prolia injection scheduled 08/16/22. Pt is aware of $10 copay.  Please order medication.

## 2022-08-06 ENCOUNTER — Emergency Department: Payer: Medicare PPO

## 2022-08-06 ENCOUNTER — Other Ambulatory Visit: Payer: Self-pay

## 2022-08-06 ENCOUNTER — Emergency Department
Admission: EM | Admit: 2022-08-06 | Discharge: 2022-08-06 | Disposition: A | Discharge status: Home / Self Care | Payer: Medicare PPO | Attending: Emergency Medicine | Admitting: Emergency Medicine

## 2022-08-06 DIAGNOSIS — M5136 Other intervertebral disc degeneration, lumbar region: Secondary | ICD-10-CM | POA: Diagnosis not present

## 2022-08-06 DIAGNOSIS — W010XXA Fall on same level from slipping, tripping and stumbling without subsequent striking against object, initial encounter: Secondary | ICD-10-CM | POA: Diagnosis not present

## 2022-08-06 DIAGNOSIS — S7001XA Contusion of right hip, initial encounter: Secondary | ICD-10-CM | POA: Insufficient documentation

## 2022-08-06 DIAGNOSIS — R1031 Right lower quadrant pain: Secondary | ICD-10-CM | POA: Diagnosis not present

## 2022-08-06 DIAGNOSIS — W19XXXA Unspecified fall, initial encounter: Secondary | ICD-10-CM | POA: Diagnosis not present

## 2022-08-06 DIAGNOSIS — Z043 Encounter for examination and observation following other accident: Secondary | ICD-10-CM | POA: Diagnosis not present

## 2022-08-06 DIAGNOSIS — S0990XA Unspecified injury of head, initial encounter: Secondary | ICD-10-CM | POA: Diagnosis not present

## 2022-08-06 DIAGNOSIS — I672 Cerebral atherosclerosis: Secondary | ICD-10-CM | POA: Diagnosis not present

## 2022-08-06 DIAGNOSIS — F0390 Unspecified dementia without behavioral disturbance: Secondary | ICD-10-CM | POA: Insufficient documentation

## 2022-08-06 DIAGNOSIS — Y9342 Activity, yoga: Secondary | ICD-10-CM | POA: Diagnosis not present

## 2022-08-06 DIAGNOSIS — M85851 Other specified disorders of bone density and structure, right thigh: Secondary | ICD-10-CM | POA: Diagnosis not present

## 2022-08-06 DIAGNOSIS — R0902 Hypoxemia: Secondary | ICD-10-CM | POA: Diagnosis not present

## 2022-08-06 DIAGNOSIS — S199XXA Unspecified injury of neck, initial encounter: Secondary | ICD-10-CM | POA: Diagnosis not present

## 2022-08-06 DIAGNOSIS — R9089 Other abnormal findings on diagnostic imaging of central nervous system: Secondary | ICD-10-CM | POA: Diagnosis not present

## 2022-08-06 DIAGNOSIS — I10 Essential (primary) hypertension: Secondary | ICD-10-CM | POA: Diagnosis not present

## 2022-08-06 DIAGNOSIS — M1611 Unilateral primary osteoarthritis, right hip: Secondary | ICD-10-CM | POA: Diagnosis not present

## 2022-08-06 DIAGNOSIS — M25551 Pain in right hip: Secondary | ICD-10-CM | POA: Diagnosis not present

## 2022-08-06 MED ORDER — ACETAMINOPHEN 325 MG PO TABS
650.0000 mg | ORAL_TABLET | Freq: Once | ORAL | Status: AC
  Administered 2022-08-06: 650 mg via ORAL
  Filled 2022-08-06: qty 2

## 2022-08-06 NOTE — ED Triage Notes (Signed)
Pt to ED for mechanical fall today during yoga class at [facility]. Complains of R groin pain and R knee pain. No LOC, head trauma  and no blood thinners. Alert, oriented.

## 2022-08-06 NOTE — ED Provider Notes (Signed)
[facility] Provider Note    Event Date/Time   First MD Initiated Contact with Patient 08/06/22 1126     (approximate)   History   Fall   HPI  Catherine Young is a 89 y.o. female who presents today for evaluation of right hip pain after a trip and fall while doing yoga this morning.  She reports that she has pain that radiates into her groin especially with weightbearing.  She denies head strike or LOC.  She does not take blood thinners.  She denies numbness or tingling or weakness.  No vomiting.  Patient Active Problem List   Diagnosis Date Noted   Hematuria 06/29/2022   Dizziness 04/19/2022   Frequent falls 04/19/2022   Bilateral hip pain 09/03/2021   Primary hypertension 03/23/2021   Decreased glomerular filtration rate (GFR) 03/23/2021   Hordeolum externum of right lower eyelid 03/23/2021   Closed compression fracture of body of L1 vertebra (HCC) 10/27/2020   Absence of bladder continence 05/25/2020   Diarrhea 12/24/2019   Syncope 12/02/2019   Musculoskeletal pain 12/02/2019   Dementia (HCC) 04/30/2019   Spinal stenosis of lumbar region 12/27/2018   Chronic pain of right knee 12/15/2017   Chronic insomnia 01/05/2017   Moderate recurrent major depression (HCC) 09/20/2016   History of intestinal obstruction 01/13/2015   Osteoporosis 01/13/2015   GAD (generalized anxiety disorder) 01/13/2015   Arthritis 01/13/2015          Physical Exam   Triage Vital Signs: ED Triage Vitals  Encounter Vitals Group     BP 08/06/22 1113 132/71     Systolic BP Percentile --      Diastolic BP Percentile --      Pulse Rate 08/06/22 1113 (!) 57     Resp 08/06/22 1113 18     Temp 08/06/22 1113 97.6 F (36.4 C)     Temp Source 08/06/22 1113 Oral     SpO2 08/06/22 1113 97 %     Weight 08/06/22 1112 110 lb 3.7 oz (50 kg)     Height 08/06/22 1112 5\' 4"  (1.626 m)     Head Circumference --      Peak Flow --      Pain Score 08/06/22 1112 5     Pain Loc  --      Pain Education --      Exclude from Growth Chart --     Most recent vital signs: Vitals:   08/06/22 1113 08/06/22 1415  BP: 132/71 (!) 175/89  Pulse: (!) 57 (!) 55  Resp: 18 18  Temp: 97.6 F (36.4 C)   SpO2: 97% 95%    Physical Exam Vitals and nursing note reviewed.  Constitutional:      General: Awake and alert. No acute distress.    Appearance: Normal appearance. The patient is normal weight.  HENT:     Head: Normocephalic and atraumatic.     Mouth: Mucous membranes are moist.  Eyes:     General: PERRL. Normal EOMs        Right eye: No discharge.        Left eye: No discharge.     Conjunctiva/sclera: Conjunctivae normal.  Cardiovascular:     Rate and Rhythm: Normal rate and regular rhythm.     Pulses: Normal pulses.  Pulmonary:     Effort: Pulmonary effort is normal. No respiratory distress.     Breath sounds: Normal breath sounds.  Abdominal:     Abdomen is  soft. There is no abdominal tenderness. No rebound or guarding. No distention. Musculoskeletal:        General: No swelling. Normal range of motion.     Cervical back: Normal range of motion and neck supple. No midline cervical spine tenderness.  Full range of motion of neck.  Negative Spurling test.  Negative Lhermitte sign.  Normal strength and sensation in bilateral upper extremities. Normal grip strength bilaterally.  Normal intrinsic muscle function of the hand bilaterally.  Normal radial pulses bilaterally. Pelvis stable.  Able to lift bilateral legs up off of the bed, though mild pain with logroll of the right hip.  No tenderness to palpation to the groin or hip area.  There is no hematoma or ecchymosis noted.  She has normal strength and sensation in her bilateral lower extremities.  Normal distal pulses Skin:    General: Skin is warm and dry.     Capillary Refill: Capillary refill takes less than 2 seconds.     Findings: No rash.  Neurological:     Mental Status: The patient is awake and alert.    Neurological: GCS 15 alert and oriented x3 Normal speech, no expressive or receptive aphasia or dysarthria Cranial nerves II through XII intact Normal visual fields 5 out of 5 strength in all 4 extremities with intact sensation throughout No extremity drift Normal finger-to-nose testing, no limb or truncal ataxia    ED Results / Procedures / Treatments   Labs (all labs ordered are listed, but only abnormal results are displayed) Labs Reviewed - No data to display   EKG     RADIOLOGY I independently reviewed and interpreted imaging and agree with radiologists findings.     PROCEDURES:  Critical Care performed:   Procedures   MEDICATIONS ORDERED IN ED: Medications  acetaminophen (TYLENOL) tablet 650 mg (650 mg Oral Given 08/06/22 1301)     IMPRESSION / MDM / ASSESSMENT AND PLAN / ED COURSE  I reviewed the triage vital signs and the nursing notes.   Differential diagnosis includes, but is not limited to, hip fracture, contusion, intracranial hemorrhage, concussion.  Patient is awake and alert, hemodynamically stable and afebrile.  She is nontoxic in appearance.  She has full normal range of motion of all 4 extremities, though she has minimal discomfort with log roll of right hip.  She has no abdominal, chest wall, or vertebral tenderness.  There is no hematoma or ecchymosis noted on exam.  She is neurovascularly intact, normal and equal pulses in all 4 extremities.  Sensation intact to light touch throughout all 4 extremities.  No swelling noted anywhere.  CT head and neck obtained per [place] criteria.  X-ray of her hip and knee obtained is negative for any acute injuries.  However, given that she has pain that radiates into her groin, concern for occult fracture.  Therefore patient agreed to CT pelvis.  CT pelvis results are reassuring.  Patient reports that her pain is significantly improved after the Tylenol.  She is able to ambulate with a steady gait.  We  discussed return precautions and outpatient follow-up.  Patient understands and agrees with plan.  She was discharged in stable condition.   Patient's presentation is most consistent with acute complicated illness / injury requiring diagnostic workup.     FINAL CLINICAL IMPRESSION(S) / ED DIAGNOSES   Final diagnoses:  Fall, initial encounter  Contusion of right hip, initial encounter     Rx / DC Orders   ED Discharge Orders  None        Note:  This document was prepared using Dragon voice recognition software and may include unintentional dictation errors.   Keturah Shavers 08/06/22 1459    Dionne Bucy, MD 08/06/22 [phone]

## 2022-08-06 NOTE — ED Triage Notes (Signed)
First nurse: Pt presents via EMS c/o fall from [facility] from chair, c/o right sided groin pain. EMS reports pt tripped out of chair.

## 2022-08-06 NOTE — Discharge Instructions (Signed)
Your CT scans and Xrays are normal. Please return for new, worsening, or change in symptoms or other concerns. It was a pleasure caring for you today.

## 2022-08-06 NOTE — ED Notes (Signed)
Assisted Pt to bedside commode

## 2022-08-11 ENCOUNTER — Telehealth: Payer: Self-pay | Admitting: *Deleted

## 2022-08-11 NOTE — Transitions of Care (Post Inpatient/ED Visit) (Signed)
08/11/2022  Name: Catherine Young MRN: [id number] DOB: Jun 16, 1931  Today's TOC FU Call Status: Today's TOC FU Call Status:: Successful TOC FU Call Competed TOC FU Call Complete Date: 08/11/22  Transition Care Management Follow-up Telephone Call Date of Discharge: 08/07/22 Discharge Facility: [facility]) Type of Discharge: Emergency Department Reason for ED Visit: Other: (fall contusion of rt hip) How have you been since you were released from the hospital?: Better (I am doing good) Any questions or concerns?: No  Items Reviewed: Did you receive and understand the discharge instructions provided?: No Medications obtained,verified, and reconciled?: No Medications Not Reviewed Reasons:: Other: (patient refused to go over list) Any new allergies since your discharge?: No Dietary orders reviewed?: No Do you have support at home?: Yes People in Home: alone Name of Support/Comfort Primary Source: fred  Medications Reviewed Today: Medications Reviewed Today     Reviewed by Damita Lack, CMA (Certified Medical Assistant) on 07/26/22 at 1051  Med List Status: <None>   Medication Order Taking? Sig Documenting Provider Last Dose Status Informant  amLODipine (NORVASC) 2.5 MG tablet [id number]  TAKE 1 TABLET(2.5 MG) BY MOUTH DAILY Bedsole, Amy E, MD  Active   clonazePAM (KLONOPIN) 1 MG tablet [id number]  TAKE 1/2 TABLET(0.5 MG) BY MOUTH TWICE DAILY AS NEEDED FOR ANXIETY Bedsole, Amy E, MD  Active   donepezil (ARICEPT) 5 MG tablet [id number]  Take 1 tablet (5 mg total) by mouth at bedtime. Bedsole, Amy E, MD  Active   Ibuprofen-diphenhydrAMINE HCl (ADVIL PM) 200-25 MG CAPS [id number]  Take 2 Capfuls by mouth at bedtime. [provider]  Active   sertraline (ZOLOFT) 100 MG tablet [id number]  TAKE 1 TABLET(100 MG) BY MOUTH AT BEDTIME Allegra Grana, FNP  Active   traMADol (ULTRAM) 50 MG tablet [id number]  TAKE 1 TABLET(50 MG) BY MOUTH EVERY 12 HOURS AS  NEEDED Bedsole, Amy E, MD  Active   traZODone (DESYREL) 100 MG tablet [id number]  TAKE 1 TABLET(50 MG) BY MOUTH AT BEDTIME AS NEEDED FOR SLEEP Bedsole, Amy E, MD  Active             Home Care and Equipment/Supplies: Were Home Health Services Ordered?: NA Any new equipment or medical supplies ordered?: NA  Functional Questionnaire: Do you need assistance with bathing/showering or dressing?: No Do you need assistance with meal preparation?: No Do you have difficulty maintaining continence: No Do you need assistance with getting out of bed/getting out of a chair/moving?: No Do you have difficulty managing or taking your medications?: No  Follow up appointments reviewed: PCP Follow-up appointment confirmed?: Yes Date of PCP follow-up appointment?: 08/16/22 Follow-up Provider: Nurse visit [facility] Follow-up appointment confirmed?: NA Do you need transportation to your follow-up appointment?: No Do you understand care options if your condition(s) worsen?: Yes-patient verbalized understanding  SDOH Interventions Today    Flowsheet Row Most Recent Value  SDOH Interventions   Food Insecurity Interventions Intervention Not Indicated  Housing Interventions Intervention Not Indicated  Transportation Interventions Intervention Not Indicated, Patient Resources (Friends/Family)      Interventions Today    Flowsheet Row Most Recent Value  General Interventions   General Interventions Discussed/Reviewed General Interventions Discussed, General Interventions Reviewed, Doctor Visits      TOC Interventions Today    Flowsheet Row Most Recent Value  TOC Interventions   TOC Interventions Discussed/Reviewed TOC Interventions Discussed, TOC Interventions Reviewed        Gean Maidens BSN RN [facility]  [phone]   

## 2022-08-12 ENCOUNTER — Telehealth: Payer: Self-pay | Admitting: Family Medicine

## 2022-08-12 NOTE — Telephone Encounter (Signed)
[facility] called to confirm if Dr. Ermalene Searing received a fax from their facility for PT orders? [facility] states they'll refax order in case it wasn't received. Call back #  [phone]

## 2022-08-12 NOTE — Telephone Encounter (Signed)
Left message for [facility] that PT orders have been received but Dr. Ermalene Searing is out of the office this week so the order will be signed next week upon her return.

## 2022-08-16 ENCOUNTER — Other Ambulatory Visit: Payer: Self-pay

## 2022-08-16 ENCOUNTER — Emergency Department: Payer: Medicare PPO

## 2022-08-16 ENCOUNTER — Inpatient Hospital Stay
Admission: EM | Admit: 2022-08-16 | Discharge: 2022-08-23 | DRG: 536 | Disposition: A | Discharge status: Skilled Nursing Facility | Payer: Medicare PPO | Attending: Internal Medicine | Admitting: Internal Medicine

## 2022-08-16 ENCOUNTER — Ambulatory Visit: Payer: Medicare PPO

## 2022-08-16 ENCOUNTER — Encounter: Payer: Self-pay | Admitting: Emergency Medicine

## 2022-08-16 DIAGNOSIS — Z85828 Personal history of other malignant neoplasm of skin: Secondary | ICD-10-CM | POA: Diagnosis not present

## 2022-08-16 DIAGNOSIS — Y92039 Unspecified place in apartment as the place of occurrence of the external cause: Secondary | ICD-10-CM

## 2022-08-16 DIAGNOSIS — R296 Repeated falls: Secondary | ICD-10-CM | POA: Diagnosis not present

## 2022-08-16 DIAGNOSIS — M25572 Pain in left ankle and joints of left foot: Secondary | ICD-10-CM | POA: Diagnosis not present

## 2022-08-16 DIAGNOSIS — Y93B9 Activity, other involving muscle strengthening exercises: Secondary | ICD-10-CM

## 2022-08-16 DIAGNOSIS — I959 Hypotension, unspecified: Secondary | ICD-10-CM | POA: Diagnosis not present

## 2022-08-16 DIAGNOSIS — W19XXXA Unspecified fall, initial encounter: Secondary | ICD-10-CM

## 2022-08-16 DIAGNOSIS — R2681 Unsteadiness on feet: Secondary | ICD-10-CM | POA: Diagnosis present

## 2022-08-16 DIAGNOSIS — F0390 Unspecified dementia without behavioral disturbance: Secondary | ICD-10-CM

## 2022-08-16 DIAGNOSIS — Z833 Family history of diabetes mellitus: Secondary | ICD-10-CM | POA: Diagnosis not present

## 2022-08-16 DIAGNOSIS — Z801 Family history of malignant neoplasm of trachea, bronchus and lung: Secondary | ICD-10-CM

## 2022-08-16 DIAGNOSIS — I10 Essential (primary) hypertension: Secondary | ICD-10-CM | POA: Diagnosis not present

## 2022-08-16 DIAGNOSIS — S32511A Fracture of superior rim of right pubis, initial encounter for closed fracture: Principal | ICD-10-CM | POA: Diagnosis present

## 2022-08-16 DIAGNOSIS — Z87891 Personal history of nicotine dependence: Secondary | ICD-10-CM

## 2022-08-16 DIAGNOSIS — F32A Depression, unspecified: Secondary | ICD-10-CM | POA: Diagnosis not present

## 2022-08-16 DIAGNOSIS — S32119A Unspecified Zone I fracture of sacrum, initial encounter for closed fracture: Secondary | ICD-10-CM | POA: Diagnosis present

## 2022-08-16 DIAGNOSIS — M810 Age-related osteoporosis without current pathological fracture: Secondary | ICD-10-CM | POA: Diagnosis present

## 2022-08-16 DIAGNOSIS — E559 Vitamin D deficiency, unspecified: Secondary | ICD-10-CM | POA: Diagnosis present

## 2022-08-16 DIAGNOSIS — Z90711 Acquired absence of uterus with remaining cervical stump: Secondary | ICD-10-CM | POA: Diagnosis not present

## 2022-08-16 DIAGNOSIS — Z888 Allergy status to other drugs, medicaments and biological substances status: Secondary | ICD-10-CM | POA: Diagnosis not present

## 2022-08-16 DIAGNOSIS — S329XXA Fracture of unspecified parts of lumbosacral spine and pelvis, initial encounter for closed fracture: Secondary | ICD-10-CM | POA: Diagnosis not present

## 2022-08-16 DIAGNOSIS — S3210XS Unspecified fracture of sacrum, sequela: Secondary | ICD-10-CM

## 2022-08-16 DIAGNOSIS — F5104 Psychophysiologic insomnia: Secondary | ICD-10-CM | POA: Diagnosis present

## 2022-08-16 DIAGNOSIS — S3210XA Unspecified fracture of sacrum, initial encounter for closed fracture: Secondary | ICD-10-CM | POA: Diagnosis not present

## 2022-08-16 DIAGNOSIS — Z66 Do not resuscitate: Secondary | ICD-10-CM | POA: Diagnosis present

## 2022-08-16 DIAGNOSIS — F03A4 Unspecified dementia, mild, with anxiety: Secondary | ICD-10-CM | POA: Diagnosis not present

## 2022-08-16 DIAGNOSIS — S32591A Other specified fracture of right pubis, initial encounter for closed fracture: Secondary | ICD-10-CM

## 2022-08-16 DIAGNOSIS — Z800 Family history of malignant neoplasm of digestive organs: Secondary | ICD-10-CM | POA: Diagnosis not present

## 2022-08-16 DIAGNOSIS — W07XXXA Fall from chair, initial encounter: Secondary | ICD-10-CM | POA: Diagnosis present

## 2022-08-16 DIAGNOSIS — S32501A Unspecified fracture of right pubis, initial encounter for closed fracture: Secondary | ICD-10-CM | POA: Diagnosis not present

## 2022-08-16 DIAGNOSIS — Z8261 Family history of arthritis: Secondary | ICD-10-CM

## 2022-08-16 DIAGNOSIS — M25551 Pain in right hip: Secondary | ICD-10-CM | POA: Diagnosis not present

## 2022-08-16 DIAGNOSIS — R1030 Lower abdominal pain, unspecified: Secondary | ICD-10-CM | POA: Diagnosis not present

## 2022-08-16 DIAGNOSIS — Z79899 Other long term (current) drug therapy: Secondary | ICD-10-CM

## 2022-08-16 DIAGNOSIS — S32501D Unspecified fracture of right pubis, subsequent encounter for fracture with routine healing: Secondary | ICD-10-CM | POA: Diagnosis not present

## 2022-08-16 LAB — BASIC METABOLIC PANEL
Anion gap: 7 (ref 5–15)
BUN: 12 mg/dL (ref 8–23)
CO2: 28 mmol/L (ref 22–32)
Calcium: 8.6 mg/dL — ABNORMAL LOW (ref 8.9–10.3)
Chloride: 101 mmol/L (ref 98–111)
Creatinine, Ser: 0.74 mg/dL (ref 0.44–1.00)
GFR, Estimated: >60 mL/min (ref >60)
Glucose, Bld: 89 mg/dL (ref 70–99)
Potassium: 3.8 mmol/L (ref 3.5–5.1)
Sodium: 136 mmol/L (ref 135–145)

## 2022-08-16 LAB — CBC WITH DIFFERENTIAL/PLATELET
Abs Immature Granulocytes: 0.04 10*3/uL (ref 0.00–0.07)
Basophils Absolute: 0 10*3/uL (ref 0.0–0.1)
Basophils Relative: 0 %
Eosinophils Absolute: 0.3 10*3/uL (ref 0.0–0.5)
Eosinophils Relative: 4 %
HCT: 32.8 % — ABNORMAL LOW (ref 36.0–46.0)
Hemoglobin: 10.5 g/dL — ABNORMAL LOW (ref 12.0–15.0)
Immature Granulocytes: 1 %
Lymphocytes Relative: 22 %
Lymphs Abs: 1.6 10*3/uL (ref 0.7–4.0)
MCH: 32 pg (ref 26.0–34.0)
MCHC: 32 g/dL (ref 30.0–36.0)
MCV: 100 fL (ref 80.0–100.0)
Monocytes Absolute: 0.6 10*3/uL (ref 0.1–1.0)
Monocytes Relative: 8 %
Neutro Abs: 4.6 10*3/uL (ref 1.7–7.7)
Neutrophils Relative %: 65 %
Platelets: 389 10*3/uL (ref 150–400)
RBC: 3.28 MIL/uL — ABNORMAL LOW (ref 3.87–5.11)
RDW: 12 % (ref 11.5–15.5)
WBC: 7.2 10*3/uL (ref 4.0–10.5)
nRBC: 0 % (ref 0.0–0.2)

## 2022-08-16 MED ORDER — SODIUM CHLORIDE 0.9 % IV SOLN: INTRAVENOUS | Status: DC

## 2022-08-16 MED ORDER — ONDANSETRON HCL 4 MG PO TABS: 4.0000 mg | ORAL_TABLET | Freq: Four times a day (QID) | ORAL | Status: DC | PRN

## 2022-08-16 MED ORDER — MORPHINE SULFATE (PF) 4 MG/ML IV SOLN
4.0000 mg | INTRAVENOUS | Status: DC | PRN
  Administered 2022-08-16 – 2022-08-17 (×2): 4 mg via INTRAVENOUS
  Filled 2022-08-16 (×2): qty 1

## 2022-08-16 MED ORDER — SERTRALINE HCL 50 MG PO TABS
100.0000 mg | ORAL_TABLET | Freq: Every day | ORAL | Status: DC
  Administered 2022-08-16 – 2022-08-22 (×7): 100 mg via ORAL
  Filled 2022-08-16 (×8): qty 2

## 2022-08-16 MED ORDER — TRAMADOL HCL 50 MG PO TABS: 50.0000 mg | ORAL_TABLET | Freq: Two times a day (BID) | ORAL | Status: DC | PRN

## 2022-08-16 MED ORDER — CLONAZEPAM 0.5 MG PO TABS
0.5000 mg | ORAL_TABLET | Freq: Two times a day (BID) | ORAL | Status: DC | PRN
  Administered 2022-08-16 – 2022-08-23 (×4): 0.5 mg via ORAL
  Filled 2022-08-16 (×4): qty 1

## 2022-08-16 MED ORDER — MORPHINE SULFATE (PF) 4 MG/ML IV SOLN
4.0000 mg | Freq: Once | INTRAVENOUS | Status: AC
  Administered 2022-08-16: 4 mg via INTRAVENOUS
  Filled 2022-08-16: qty 1

## 2022-08-16 MED ORDER — ONDANSETRON HCL 4 MG/2ML IJ SOLN
4.0000 mg | Freq: Once | INTRAMUSCULAR | Status: AC
  Administered 2022-08-16: 4 mg via INTRAVENOUS
  Filled 2022-08-16: qty 2

## 2022-08-16 MED ORDER — ONDANSETRON HCL 4 MG/2ML IJ SOLN: 4.0000 mg | Freq: Four times a day (QID) | INTRAMUSCULAR | Status: DC | PRN

## 2022-08-16 MED ORDER — ENOXAPARIN SODIUM 40 MG/0.4ML IJ SOSY
40.0000 mg | PREFILLED_SYRINGE | INTRAMUSCULAR | Status: DC
  Administered 2022-08-16 – 2022-08-22 (×7): 40 mg via SUBCUTANEOUS
  Filled 2022-08-16 (×7): qty 0.4

## 2022-08-16 MED ORDER — AMLODIPINE BESYLATE 5 MG PO TABS
2.5000 mg | ORAL_TABLET | Freq: Every day | ORAL | Status: DC
  Administered 2022-08-16 – 2022-08-19 (×4): 2.5 mg via ORAL
  Filled 2022-08-16 (×4): qty 1

## 2022-08-16 MED ORDER — OXYCODONE-ACETAMINOPHEN 7.5-325 MG PO TABS
1.0000 | ORAL_TABLET | ORAL | Status: DC | PRN
  Administered 2022-08-16 – 2022-08-17 (×3): 1 via ORAL
  Filled 2022-08-16 (×3): qty 1

## 2022-08-16 MED ORDER — DONEPEZIL HCL 5 MG PO TABS
10.0000 mg | ORAL_TABLET | Freq: Every day | ORAL | Status: DC
  Administered 2022-08-16 – 2022-08-22 (×7): 10 mg via ORAL
  Filled 2022-08-16 (×7): qty 2

## 2022-08-16 MED ORDER — TRAZODONE HCL 100 MG PO TABS
100.0000 mg | ORAL_TABLET | Freq: Every evening | ORAL | Status: DC | PRN
  Administered 2022-08-16 – 2022-08-22 (×7): 100 mg via ORAL
  Filled 2022-08-16 (×7): qty 1

## 2022-08-16 NOTE — Assessment & Plan Note (Signed)
Noted highly comminuted and displaced fractures of the right pubic rami and pubic symphysis with Retzius hematoma status post traumatic fall Dr. Allena Katz with orthopedics reviewed case-nonoperable Will plan for formal PT OT evaluation and likely rehab Pain control Fall precautions Follow

## 2022-08-16 NOTE — ED Provider Notes (Signed)
-----------------------------------------   11:11 AM on 08/16/2022 ----------------------------------------- I have seen and evaluated the patient in conjunction with physician assistant Greig Right.  Patient has suffered a comminuted displaced fracture of the right pubic rami.  Does not appear to be surgical.  And we will consult orthopedics.  However given the patient's pain and as she lives in independent living facility do not believe she is going to be able to safely ambulate or care for herself in her current state.  Will admit to the hospital service for ongoing pain control.  I have seen and evaluated the patient.  She is agreeable to this plan of care as well.   Minna Antis, MD 08/16/22 [phone]

## 2022-08-16 NOTE — Assessment & Plan Note (Signed)
BP stable Titrate home regimen 

## 2022-08-16 NOTE — Evaluation (Signed)
Occupational Therapy Evaluation Patient Details Name: Catherine Young MRN: [id number] DOB: 01/13/1932 Today's Date: 08/16/2022   History of Present Illness Catherine Young is a 89 y.o. female with history of osteopenia, hypertension, cataracts presents emergency department with continued right hip pain.  Patient was seen on July 13 for a fall.  CT of the head, C-spine, and pelvis were all negative for any acute abnormality. Returned to ED following additional fall and persistent pain. Repeat CT scan shows right sacral ala fracture, inferior pubic ramus fracture, and superior pubic ramus fractures.   Clinical Impression   Patient received for OT evaluation. See flowsheet below for details of function. Generally, patient requiring supervision for bed mobility, CGA with gait belt and RW, slowly, for functional mobility, and set up-MAX A for ADLs. Patient will benefit from continued OT while in acute care.       Recommendations for follow up therapy are one component of a multi-disciplinary discharge planning process, led by the attending physician.  Recommendations may be updated based on patient status, additional functional criteria and insurance authorization.   Assistance Recommended at Discharge Intermittent Supervision/Assistance  Patient can return home with the following A little help with walking and/or transfers;A lot of help with bathing/dressing/bathroom;Assistance with cooking/housework;Direct supervision/assist for medications management;Direct supervision/assist for financial management;Assist for transportation;Help with stairs or ramp for entrance    Functional Status Assessment  Patient has had a recent decline in their functional status and demonstrates the ability to make significant improvements in function in a reasonable and predictable amount of time.  Equipment Recommendations  Other (comment) (defer to next venue of care)    Recommendations for Other Services        Precautions / Restrictions Precautions Precautions: Fall Restrictions Weight Bearing Restrictions: Yes Other Position/Activity Restrictions: per ortho consult note: WBAT      Mobility Bed Mobility Overal bed mobility: Needs Assistance Bed Mobility: Supine to Sit     Supine to sit: Supervision     General bed mobility comments: HOB elevated, extra time    Transfers Overall transfer level: Needs assistance Equipment used: Rolling walker (2 wheels) Transfers: Sit to/from Stand, Bed to chair/wheelchair/BSC Sit to Stand: Min guard, Min assist, +2 physical assistance     Step pivot transfers: Min guard     General transfer comment: +2 at start for safety, quickly transitioning to +1 with transfers, patient require frequent cues for hand placement. Completed transfer to bedside commode to void bladder, patient able to complete with CGA. Then transferred to recliner, where patient request to sit at end of session. CGA.      Balance Overall balance assessment: Needs assistance Sitting-balance support: Bilateral upper extremity supported, Feet supported Sitting balance-Leahy Scale: Good     Standing balance support: Bilateral upper extremity supported, During functional activity, Reliant on assistive device for balance Standing balance-Leahy Scale: Fair Standing balance comment: mild unsteadiness; reliant on RW for support due to pain with mobility                           ADL either performed or assessed with clinical judgement   ADL Overall ADL's : Needs assistance/impaired Eating/Feeding: Set up;Sitting Eating/Feeding Details (indicate cue type and reason): set up for lunch at end of session Grooming: Wash/dry hands;Set up;Sitting Grooming Details (indicate cue type and reason): wet wipe and sanitizer provided to pt while seated in recliner  Lower Body Dressing: Maximal assistance Lower Body Dressing Details (indicate cue type and  reason): pt MAX Afor donning new underwear while seated; pt able to stand with CGA for pulling up over hips Toilet Transfer: Min guard;Cueing for safety;BSC/3in1;Rolling walker (2 wheels) Toilet Transfer Details (indicate cue type and reason): Requires cues for hand placement on arm rests of BSC. Toileting- Clothing Manipulation and Hygiene: Min guard;Minimal assistance;Cueing for safety;Sitting/lateral lean Toileting - Clothing Manipulation Details (indicate cue type and reason): CGA-MIN A while standing; pt leaning far and needing CGA for safety     Functional mobility during ADLs: Rolling walker (2 wheels);Min guard General ADL Comments: BIL UE WFL during session today.     Vision Patient Visual Report: No change from baseline       Perception     Praxis      Pertinent Vitals/Pain Pain Assessment Pain Assessment: 0-10 Pain Score: 6  (after mobility; 3/10 before mobility) Pain Location: Hip on R Pain Descriptors / Indicators: Sharp Pain Intervention(s): Limited activity within patient's tolerance, Monitored during session     Hand Dominance Right   Extremity/Trunk Assessment Upper Extremity Assessment Upper Extremity Assessment: Overall WFL for tasks assessed   Lower Extremity Assessment Lower Extremity Assessment: Generalized weakness   Cervical / Trunk Assessment Cervical / Trunk Assessment: Normal   Communication Communication Communication: No difficulties   Cognition Arousal/Alertness: Awake/alert Behavior During Therapy: WFL for tasks assessed/performed Overall Cognitive Status: No family/caregiver present to determine baseline cognitive functioning                                 General Comments: Pt is alert; orientation questions not explicitly asked, although she does answer some questions differently when asked later in the session; for example, tells OT she is independent with ADLs and does not have a shower chair, then later tells OT that  her "social worker" is there to supervise her showers and she has a shower chair that she uses.     General Comments  On room air. Very pleasant; motivated.    Exercises     Shoulder Instructions      Home Living Family/patient expects to be discharged to:: Other (Comment) (ILF at [facility]) Living Arrangements: Alone;Other (Comment) (Independent living apartment at [address] [facility]) Available Help at Discharge: Family;Other (Comment) (pt planning to move to ALF at [facility]) Type of Home: Apartment Home Access: Level entry     Home Layout: One level     Bathroom Shower/Tub: Tub/shower unit;Walk-in shower   Bathroom Toilet: Handicapped height Bathroom Accessibility: Yes How Accessible: Accessible via walker Home Equipment: Rollator (4 wheels);BSC/3in1;Shower seat;Grab bars - tub/shower   Additional Comments: Patient lives in Independent Living at [place]. Reports she was in the process of transitioning to assisted living. Son lives in [place], Daughter lives in [place].      Prior Functioning/Environment Prior Level of Function : Needs assist             Mobility Comments: Reports began ambulating with a Rollator a few months prior due to increased falls.Reports she has had frequent falls in the past year, about 2x/month. Prior to that was not using rollator; has been participating in chair yoga at [facility]. ADLs Comments: Has someone (sounds like a personal care assistant or caregiver) that assists her with cooking, cleaning/laundry, as well as taking showers. Unsure of frequency they are able to assist. No family member present. States  that she has a BSC next to her bed. States that she wears briefs all the time for incontinence.        OT Problem List: Decreased strength;Decreased range of motion;Decreased activity tolerance;Impaired balance (sitting and/or standing);Decreased knowledge of use of DME or AE;Decreased safety awareness      OT  Treatment/Interventions: Self-care/ADL training;Therapeutic exercise;DME and/or AE instruction;Therapeutic activities;Patient/family education    OT Goals(Current goals can be found in the care plan section) Acute Rehab OT Goals Patient Stated Goal: Get better OT Goal Formulation: With patient Time For Goal Achievement: 08/30/22 Potential to Achieve Goals: Good ADL Goals Pt Will Perform Grooming: with modified independence;standing Pt Will Perform Lower Body Dressing: with modified independence;sit to/from stand Pt Will Transfer to Toilet: with modified independence;bedside commode;ambulating Pt Will Perform Toileting - Clothing Manipulation and hygiene: with modified independence;sit to/from stand  OT Frequency: Min 1X/week    Co-evaluation PT/OT/SLP Co-Evaluation/Treatment: Yes Reason for Co-Treatment: For patient/therapist safety;To address functional/ADL transfers PT goals addressed during session: Mobility/safety with mobility OT goals addressed during session: ADL's and self-care;Proper use of Adaptive equipment and DME      AM-PAC OT "6 Clicks" Daily Activity     Outcome Measure Help from another person eating meals?: None Help from another person taking care of personal grooming?: A Little Help from another person toileting, which includes using toliet, bedpan, or urinal?: A Little Help from another person bathing (including washing, rinsing, drying)?: A Lot Help from another person to put on and taking off regular upper body clothing?: None Help from another person to put on and taking off regular lower body clothing?: A Lot 6 Click Score: 18   End of Session Equipment Utilized During Treatment: Gait belt;Rolling walker (2 wheels);Other (comment) [facility]) Nurse Communication: Mobility status  Activity Tolerance: Patient tolerated treatment well Patient left: in chair;with call bell/phone within reach;with chair alarm set  OT Visit Diagnosis: Unsteadiness on feet  (R26.81);Repeated falls (R29.6)                Time: 9562-1308 OT Time Calculation (min): 43 min Charges:  OT General Charges $OT Visit: 1 Visit OT Evaluation $OT Eval Moderate Complexity: 1 Mod OT Treatments $Self Care/Home Management : 8-22 mins  Linward Foster, MS, OTR/L  Alvester Morin 08/16/2022, 4:14 PM

## 2022-08-16 NOTE — Progress Notes (Signed)
Physical Therapy Evaluation Patient Details Name: Catherine Young MRN: [id number] DOB: [birth year] Today's Date: 08/16/2022  History of Present Illness  Catherine Young is a 89 y.o. female with history of osteopenia, hypertension, cataracts presents emergency department with continued right hip pain.  Patient was seen on July 13 for a fall.  CT of the head, C-spine, and pelvis were all negative for any acute abnormality. Returned to ED following additional fall and persistent pain. Repeat CT scan shows right sacral ala fracture, inferior pubic ramus fracture, and superior pubic ramus fractures.   Clinical Impression  Patient supine in bed upon arrival, agreeable to PT tx session this date. Patient is currently requiring supervision with Bed Mobility, increased time due to pain. Patient able to stand with Min A then progressing to CGA throughout session. Patient was able to ambulate approx 10 ft in room with RW and complete transfer to/from bedside commode with CGA. Frequent cues required for hand placement and safety. Patient did have increase in pain with activity, but overall tolerate and did well during session. Patient will benefit from skilled acute PT services to address impairments (see below for additional details). Patient left in recliner with all needs in reach and chair alarm set. RN notified of mobility status and positioning. Will continue to follow acutely.       Assistance Recommended at Discharge Intermittent Supervision/Assistance  If plan is discharge home, recommend the following:  Can travel by private vehicle  A little help with walking and/or transfers;A little help with bathing/dressing/bathroom;Assistance with cooking/housework;Assist for transportation;Help with stairs or ramp for entrance   Yes    Equipment Recommendations Other (comment) (TBD at next level of care)  Recommendations for Other Services       Functional Status Assessment Patient has had a  recent decline in their functional status and demonstrates the ability to make significant improvements in function in a reasonable and predictable amount of time.     Precautions / Restrictions Precautions Precautions: Fall Restrictions Weight Bearing Restrictions: Yes Other Position/Activity Restrictions: per ortho consult note: WBAT      Mobility  Bed Mobility Overal bed mobility: Needs Assistance Bed Mobility: Supine to Sit     Supine to sit: Supervision     General bed mobility comments: Patient able to get from supine to sit with Supervision, HOB elevated, patient require increased time for completion.    Transfers Overall transfer level: Needs assistance Equipment used: Rolling walker (2 wheels) Transfers: Sit to/from Stand, Bed to chair/wheelchair/BSC Sit to Stand: Min guard, Min assist, +2 physical assistance   Step pivot transfers: Min guard       General transfer comment: +2 at start for safety, quickly transitioning to +1 with transfers, patient require frequent cues for hand placement. Completed transfer to bedside commode to void bladder, patient able to complete with CGA. Then transferred to recliner, where patient request to sit at end of session. CGA.    Ambulation/Gait Ambulation/Gait assistance: Min assist, Min guard Gait Distance (Feet): 10 Feet Assistive device: Rolling walker (2 wheels) Gait Pattern/deviations: Decreased step length - right, Decreased step length - left, Narrow base of support Gait velocity: Decreased     General Gait Details: ambulated approx 10 ft in patient's room with RW, initially require Min A for ambulation, progressing to CGA with increased time and cues for proper use of AD. Decreaed step length and increased reliance on RW. Pain increased to 6/10 with ambulation.  Stairs  Wheelchair Mobility     Tilt Bed    Modified Rankin (Stroke Patients Only)       Balance Overall balance assessment: Needs  assistance Sitting-balance support: Bilateral upper extremity supported, Feet supported Sitting balance-Leahy Scale: Good     Standing balance support: Bilateral upper extremity supported, During functional activity, Reliant on assistive device for balance Standing balance-Leahy Scale: Fair Standing balance comment: mild unsteadiness; reliant on RW for support due to pain with mobility                             Pertinent Vitals/Pain Pain Assessment Pain Assessment: 0-10 Pain Score: 3  Pain Location: Hip Pain Descriptors / Indicators: Sharp Pain Intervention(s): Limited activity within patient's tolerance, Premedicated before session, Monitored during session    Home Living Family/patient expects to be discharged to:: Private residence Living Arrangements: Alone;Other (Comment)   Type of Home: Apartment Home Access: Level entry       Home Layout: One level Home Equipment: Rollator (4 wheels);BSC/3in1;Shower seat;Grab bars - tub/shower Additional Comments: Patient lives in Independent Living at [place]. Reports she was in the process of transitioning to assisted living. Son lives in [place], Daughter lives in [place].    Prior Function Prior Level of Function : Independent/Modified Independent             Mobility Comments: Reports began ambulating with a Rollatora  few months prior due to increased falls.Reports she has had frequent falls in the past year. ADLs Comments: Has someone (sounds like a personal care assistant or caregiver) that assists her with cooking, cleaning/laundry, as well as taking showers. Unsure of frequency they are able to assist. No family member present.     Hand Dominance   Dominant Hand: Right    Extremity/Trunk Assessment        Lower Extremity Assessment Lower Extremity Assessment: Generalized weakness (deferred manual strength testing due to pain)       Communication   Communication: No difficulties  Cognition  Arousal/Alertness: Awake/alert Behavior During Therapy: WFL for tasks assessed/performed Overall Cognitive Status: No family/caregiver present to determine baseline cognitive functioning                                 General Comments: Patient alert and oriented        General Comments      Exercises     Assessment/Plan    PT Assessment Patient needs continued PT services  PT Problem List Decreased strength;Decreased activity tolerance;Decreased balance;Decreased mobility;Decreased knowledge of use of DME;Pain       PT Treatment Interventions DME instruction;Gait training;Functional mobility training;Therapeutic activities;Therapeutic exercise;Balance training;Neuromuscular re-education    PT Goals (Current goals can be found in the Care Plan section)  Acute Rehab PT Goals Patient Stated Goal: get my strength back PT Goal Formulation: With patient Time For Goal Achievement: 08/30/22 Potential to Achieve Goals: Good    Frequency Min 1X/week     Co-evaluation PT/OT/SLP Co-Evaluation/Treatment: Yes Reason for Co-Treatment: For patient/therapist safety;To address functional/ADL transfers PT goals addressed during session: Mobility/safety with mobility         AM-PAC PT "6 Clicks" Mobility  Outcome Measure Help needed turning from your back to your side while in a flat bed without using bedrails?: None Help needed moving from lying on your back to sitting on the side of a flat bed without using bedrails?:  A Little Help needed moving to and from a bed to a chair (including a wheelchair)?: A Little Help needed standing up from a chair using your arms (e.g., wheelchair or bedside chair)?: A Little Help needed to walk in hospital room?: A Lot Help needed climbing 3-5 steps with a railing? : A Lot 6 Click Score: 17    End of Session Equipment Utilized During Treatment: Gait belt Activity Tolerance: Patient tolerated treatment well Patient left: in  chair;with chair alarm set Nurse Communication: Mobility status PT Visit Diagnosis: Unsteadiness on feet (R26.81);History of falling (Z91.81);Muscle weakness (generalized) (M62.81);Other abnormalities of gait and mobility (R26.89)    Time: 7829-5621 PT Time Calculation (min) (ACUTE ONLY): 36 min   Charges:   PT Evaluation $PT Eval Low Complexity: 1 Low   PT General Charges $$ ACUTE PT VISIT: 1 Visit        Creed Copper Fairly, PT, DPT 08/16/22 3:55 PM

## 2022-08-16 NOTE — Consult Note (Signed)
ORTHOPAEDIC CONSULTATION  REQUESTING PHYSICIAN: Floydene Flock, MD  Chief Complaint:   R hip pain  History of Present Illness: Catherine Young is a 89 y.o. female who Initially had a fall on 08/06/2022.  She presented to the emergency department at that time.  No fracture was noted at that time.  Patient states she had continued pain since that fall and she had 1 episode where she slowly slid down and landed on her buttocks, but did not have dramatically increased pain after that incident.  She presents today due to persistent pain.  Repeat CT scan shows right sacral ala fracture, inferior pubic ramus fracture, and superior pubic ramus fractures.  Prior to her fall she states she had been ambulating unassisted, but does feel like she should have the knee in a walker.  The patient lives by herself at [facility] assisted living.    Past Medical History:  Diagnosis Date   Arthritis    Basal cell carcinoma of tragus, left    Cataract    right   Complication of anesthesia    COULD'T MOVE WHEN WAKING UP AFTER SECONT COLON SURGERY   Depression    Hypertension    Intestinal obstruction (HCC)    Osteoporosis    Past Surgical History:  Procedure Laterality Date   ABDOMINAL HYSTERECTOMY     partial, for menorrhagia   APPENDECTOMY     CATARACT EXTRACTION W/PHACO Right 05/25/2017   Procedure: CATARACT EXTRACTION PHACO AND INTRAOCULAR LENS PLACEMENT (IOC);  Surgeon: [name], MD;  Location: [facility] ORS;  Service: Ophthalmology;  Laterality: Right;  fluid pack lot #  [id number]  exp  12/24/2018 [place]   00:39.4 AP%   9.4 CDE    3.71    COLON SURGERY     INTESTINAL BLOCKAGE FROM SCAR TISSUE X 2   IR KYPHO LUMBAR INC FX REDUCE BONE BX UNI/BIL CANNULATION INC/IMAGING  11/30/2020   Social History   Socioeconomic History   Marital status: Widowed    Spouse name: Not on file   Number of children: Not on file   Years of  education: Not on file   Highest education level: Not on file  Occupational History   Not on file  Tobacco Use   Smoking status: Former    Current packs/day: 0.00    Types: Cigarettes    Quit date: 01/24/1993    Years since quitting: 29.5   Smokeless tobacco: Never   Tobacco comments:    quit 1995  Vaping Use   Vaping status: Never Used  Substance and Sexual Activity   Alcohol use: Yes    Alcohol/week: 7.0 standard drinks of alcohol    Types: 7 Glasses of wine per week    Comment: nightly glass of wine   Drug use: No   Sexual activity: Never  Other Topics Concern   Not on file  Social History Narrative   Not on file   Social Determinants of Health   Financial Resource Strain: Low Risk  (07/25/2022)   Overall Financial Resource Strain (CARDIA)    Difficulty of Paying Living Expenses: Not hard at all  Food Insecurity: No Food Insecurity (08/11/2022)   Hunger Vital Sign    Worried About Running Out of Food in the Last Year: Never true    Ran Out of Food in the Last Year: Never true  Transportation Needs: No Transportation Needs (08/11/2022)   PRAPARE - Administrator, Civil Service (Medical): No  Lack of Transportation (Non-Medical): No  Physical Activity: Insufficiently Active (07/25/2022)   Exercise Vital Sign    Days of Exercise per Week: 1 day    Minutes of Exercise per Session: 40 min  Stress: No Stress Concern Present (07/25/2022)   [employer] of Occupational Health - Occupational Stress Questionnaire    Feeling of Stress : Not at all  Social Connections: Socially Isolated (07/25/2022)   Social Connection and Isolation Panel [NHANES]    Frequency of Communication with Friends and Family: More than three times a week    Frequency of Social Gatherings with Friends and Family: More than three times a week    Attends Religious Services: Never    Database administrator or Organizations: No    Attends Banker Meetings: Never    Marital Status:  Widowed   Family History  Problem Relation Age of Onset   Arthritis Mother    Colon cancer Mother    Cancer Mother        colon   Diabetes Father    Arthritis Sister    Colon cancer Sister    Cancer Sister        colon   Lung cancer Brother    Cancer Brother        lung   Arthritis Sister    Colon cancer Sister    Cancer Sister        colon   Allergies  Allergen Reactions   Fosamax [Alendronate] Diarrhea   Prednisone     Increased blood pressure and insomnia   Prior to Admission medications   Medication Sig Start Date End Date Taking? Authorizing Provider  amLODipine (NORVASC) 2.5 MG tablet TAKE 1 TABLET(2.5 MG) BY MOUTH DAILY Patient taking differently: Take 2.5 mg by mouth daily. 01/03/22  Yes Bedsole, Amy E, MD  clonazePAM (KLONOPIN) 1 MG tablet TAKE 1/2 TABLET(0.5 MG) BY MOUTH TWICE DAILY AS NEEDED FOR ANXIETY Patient taking differently: Take 0.5 mg by mouth 2 (two) times daily as needed for anxiety. TAKE 1/2 TABLET(0.5 MG) BY MOUTH TWICE DAILY AS NEEDED FOR ANXIETY 07/26/22  Yes Bedsole, Amy E, MD  donepezil (ARICEPT) 10 MG tablet Take 1 tablet (10 mg total) by mouth at bedtime. 07/26/22  Yes Bedsole, Amy E, MD  Ibuprofen-diphenhydrAMINE HCl (ADVIL PM) 200-25 MG CAPS Take 2 Capfuls by mouth at bedtime.   Yes [provider]  sertraline (ZOLOFT) 100 MG tablet TAKE 1 TABLET(100 MG) BY MOUTH AT BEDTIME Patient taking differently: Take 100 mg by mouth at bedtime. TAKE 1 TABLET(100 MG) BY MOUTH AT BEDTIME 06/29/22  Yes Arnett, Lyn Records, FNP  traMADol (ULTRAM) 50 MG tablet TAKE 1 TABLET(50 MG) BY MOUTH EVERY 12 HOURS AS NEEDED Patient taking differently: Take 50 mg by mouth every 12 (twelve) hours as needed for moderate pain. 07/18/22  Yes Bedsole, Amy E, MD  traZODone (DESYREL) 100 MG tablet TAKE 1 TABLET(50 MG) BY MOUTH AT BEDTIME AS NEEDED FOR SLEEP Patient taking differently: Take 100 mg by mouth at bedtime as needed for sleep. TAKE 1 TABLET(50 MG) BY MOUTH AT BEDTIME  AS NEEDED FOR SLEEP 04/19/22  Yes Bedsole, Amy E, MD  Vitamin D, Ergocalciferol, (DRISDOL) 1.25 MG (50000 UNIT) CAPS capsule Take 1 capsule (50,000 Units total) by mouth every 7 (seven) days. 07/26/22  Yes Bedsole, Amy E, MD   Recent Labs    08/16/22 0934  WBC 7.2  HGB 10.5*  HCT 32.8*  PLT 389  K 3.8  CL  101  CO2 28  BUN 12  CREATININE 0.74  GLUCOSE 89  CALCIUM 8.6*   CT PELVIS WO CONTRAST  Result Date: 08/16/2022 CLINICAL DATA:  89 year old female status post fall 10 days ago with continued pain to the right hip and groin. EXAM: CT PELVIS WITHOUT CONTRAST TECHNIQUE: Multidetector CT imaging of the pelvis was performed following the standard protocol without intravenous contrast. RADIATION DOSE REDUCTION: This exam was performed according to the departmental dose-optimization program which includes automated exposure control, adjustment of the mA and/or kV according to patient size and/or use of iterative reconstruction technique. COMPARISON:  Pelvis CT on 08/06/2022. FINDINGS: Urinary Tract: Decompressed bladder with mild mass effect related to oval space-occupying mass in the left space of Retzius which is new (series 4, image 279 and series 8, image 25. Posttraumatic hematoma favored (see pelvis osseous findings below). Kidneys and ureters is *CRASH * that kidneys not included. No ureteral enlargement. Bowel: No dilated visible small and large bowel. Diverticulosis of the descending and sigmoid colon. No free air or free fluid. Vascular/Lymphatic: Extensive Aortoiliac calcified atherosclerosis. Normal caliber distal abdominal aorta. Vascular patency is not evaluated in the absence of IV contrast. No lymphadenopathy identified. Reproductive: Surgically absent uterus. Diminutive or absent ovaries. Other: No pelvis free fluid. Incidental numerous pelvic phleboliths. Musculoskeletal: Diffuse osteopenia. Comminuted but minimally displaced fracture of the right sacral ala is now apparent on series 3,  images 26 through 42. SI joints remain symmetric. Left sacral ala appears intact. Visible L4 and L5 vertebrae appear grossly stable and intact. Bilateral iliac wings are intact. But there is severe comminution and displacement now of the medial right pubic rami, extending to the pubic symphysis. See the inferior ramus on series 3, image 108. And no displacement of the superior ramus relative to the symphysis on series 6 coronal image 28. Left pubic rami appear stable and intact. No acetabular involvement. Femoral heads remain normally located. Hip joint spaces are stable. Proximal femurs appears stable and intact. New left space of Retzius 3 x 5 by 2.5 cm oval mass (series 8, image 24) is most compatible with hematoma tracking away from the pubic symphysis in this clinical setting (volume estimated at 19 mL). No other pelvic sidewall hematoma. No discrete intramuscular hematoma. IMPRESSION: 1. Highly comminuted and displaced fractures of the right pubic rami and pubic symphysis. Displacement (series 6, image 28) and associated left space of Retzius hematoma (19 mL). Comminuted, less displaced fracture of the right sacral ala. Hips remain intact. Recommend Orthopedic consultation. 2. Underlying Osteopenia. 3.  Aortic Atherosclerosis (ICD10-I70.0). Electronically Signed   By: Odessa Fleming M.D.   On: 08/16/2022 09:17     Positive ROS: All other systems have been reviewed and were otherwise negative with the exception of those mentioned in the HPI and as above.  Physical Exam: BP (!) 132/57 (BP Location: Right Arm)   Pulse (!) 57   Temp 98.4 F (36.9 C)   Resp 15   Ht 5\' 4"  (1.626 m)   Wt 50 kg   SpO2 94%   BMI 18.92 kg/m  General:  Alert, no acute distress Psychiatric:  Patient is competent for consent with normal mood and affect     Orthopedic Exam:  RLE: 5/5 DF/PF/EHL SILT grossly over foot Foot wwp Negative log roll/axial load Able to perform 90 degrees forward flexion of the hip with external  rotation and internal rotation with only minimal difficulty   Imaging: Right sacral ala fracture, superior ramus fracture, and inferior  pubic ramus fracture.  No involvement of the acetabulum noted.  No proximal femur fracture noted.  On prior CT scan from 08/06/2022 there appears to be some suggestion of minimally displaced right superior pubic ramus and inferior pubic ramus fractures.  Sacral ala fracture is not visualized.  Assessment/Plan: Catherine Young is a 89 y.o. female with a R minimally displaced sacral ala fracture and superior and inferior pubic rami fractures.  Hematoma is not unexpected given pelvic fractures.  Low suspicion for hip fracture given clinical exam   1.  I discussed the diagnosis as well as treatment options with the patient.  Given the stable nature of the pelvic fractures, we agreed to proceed with nonoperative management.  No current plan for surgical intervention.  2.  Patient may weight-bear as tolerated.  Recommend PT/OT for mobilization.    3.  Follow-up with [facility] clinic orthopedics as an outpatient in approximately 1-2 weeks.  Will plan to follow patient peripherally while inpatient.  Please page with any questions in the interim.   Signa Kell   08/16/2022 1:03 PM

## 2022-08-16 NOTE — Assessment & Plan Note (Signed)
Stable Continue Aricept 

## 2022-08-16 NOTE — Assessment & Plan Note (Signed)
Recurrent mechanical falls with noted comminuted pelvic fracture on imaging today Suspect secondary to generalized weakness at baseline Fall precautions PT OT evaluation Follow closely

## 2022-08-16 NOTE — Assessment & Plan Note (Signed)
Continue trazodone 

## 2022-08-16 NOTE — H&P (Signed)
History and Physical    Patient: Catherine Young HQI:[id number] DOB: Jun 06, 1931 DOA: 08/16/2022 DOS: the patient was seen and examined on 08/16/2022 PCP: Excell Seltzer, MD  Patient coming from: ALF/ILF  Chief Complaint: No chief complaint on file.  HPI: Catherine Young is a 89 y.o. female with medical history significant of depression, hypertension, dementia presenting with fall and pelvic fracture.  Patient noted had a fall around July 13.  Was evaluated in the ER with normal imaging.  Patient reports going back to facility and having a subsequent fall approximately 5 days later.  Patient denies any weakness or dizziness prior to fall.  No reported head trauma loss conscious.  Has had difficulty with pain ambulation since this point.  No chest pain or shortness of breath.  No nausea or vomiting. Presented to the ER afebrile, hemodynamically stable.  Labs grossly within normal limits.  CT pelvis with Highly comminuted and displaced fractures of the right pubic rami and pubic symphysis, Retzius hematoma.  Right sacral alla comminuted fracture.  Case was discussed with Dr. Allena Katz with orthopedic surgery.  Not operable. Review of Systems: As mentioned in the history of present illness. All other systems reviewed and are negative. Past Medical History:  Diagnosis Date   Arthritis    Basal cell carcinoma of tragus, left    Cataract    right   Complication of anesthesia    COULD'T MOVE WHEN WAKING UP AFTER SECONT COLON SURGERY   Depression    Hypertension    Intestinal obstruction (HCC)    Osteoporosis    Past Surgical History:  Procedure Laterality Date   ABDOMINAL HYSTERECTOMY     partial, for menorrhagia   APPENDECTOMY     CATARACT EXTRACTION W/PHACO Right 05/25/2017   Procedure: CATARACT EXTRACTION PHACO AND INTRAOCULAR LENS PLACEMENT (IOC);  Surgeon: [name], MD;  Location: [facility] ORS;  Service: Ophthalmology;  Laterality: Right;  fluid pack lot #  [id number]  exp   12/24/2018 [place]   00:39.4 AP%   9.4 CDE    3.71    COLON SURGERY     INTESTINAL BLOCKAGE FROM SCAR TISSUE X 2   IR KYPHO LUMBAR INC FX REDUCE BONE BX UNI/BIL CANNULATION INC/IMAGING  11/30/2020   Social History:  reports that she quit smoking about 29 years ago. Her smoking use included cigarettes. She has never used smokeless tobacco. She reports current alcohol use of about 7.0 standard drinks of alcohol per week. She reports that she does not use drugs.  Allergies  Allergen Reactions   Fosamax [Alendronate] Diarrhea   Prednisone     Increased blood pressure and insomnia    Family History  Problem Relation Age of Onset   Arthritis Mother    Colon cancer Mother    Cancer Mother        colon   Diabetes Father    Arthritis Sister    Colon cancer Sister    Cancer Sister        colon   Lung cancer Brother    Cancer Brother        lung   Arthritis Sister    Colon cancer Sister    Cancer Sister        colon    Prior to Admission medications   Medication Sig Start Date End Date Taking? Authorizing Provider  amLODipine (NORVASC) 2.5 MG tablet TAKE 1 TABLET(2.5 MG) BY MOUTH DAILY 01/03/22   Bedsole, Amy E, MD  clonazePAM (KLONOPIN) 1 MG  tablet TAKE 1/2 TABLET(0.5 MG) BY MOUTH TWICE DAILY AS NEEDED FOR ANXIETY 07/26/22   Bedsole, Amy E, MD  donepezil (ARICEPT) 10 MG tablet Take 1 tablet (10 mg total) by mouth at bedtime. 07/26/22   Bedsole, Amy E, MD  Ibuprofen-diphenhydrAMINE HCl (ADVIL PM) 200-25 MG CAPS Take 2 Capfuls by mouth at bedtime.    [provider]  sertraline (ZOLOFT) 100 MG tablet TAKE 1 TABLET(100 MG) BY MOUTH AT BEDTIME 06/29/22   Allegra Grana, FNP  traMADol (ULTRAM) 50 MG tablet TAKE 1 TABLET(50 MG) BY MOUTH EVERY 12 HOURS AS NEEDED 07/18/22   Bedsole, Amy E, MD  traZODone (DESYREL) 100 MG tablet TAKE 1 TABLET(50 MG) BY MOUTH AT BEDTIME AS NEEDED FOR SLEEP 04/19/22   Bedsole, Amy E, MD  Vitamin D, Ergocalciferol, (DRISDOL) 1.25 MG (50000 UNIT) CAPS capsule  Take 1 capsule (50,000 Units total) by mouth every 7 (seven) days. 07/26/22   Excell Seltzer, MD    Physical Exam: Vitals:   08/16/22 4098 08/16/22 0825 08/16/22 0826  BP:   (!) 161/62  Pulse:  (!) 57 (!) 57  Resp:   18  Temp:   98 F (36.7 C)  SpO2:  95% 95%  Weight: 50 kg    Height: 5\' 4"  (1.626 m)     Physical Exam Constitutional:      General: She is not in acute distress.    Comments: Thin, underweight  HENT:     Head: Normocephalic and atraumatic.     Nose: Nose normal.     Mouth/Throat:     Mouth: Mucous membranes are moist.  Eyes:     Pupils: Pupils are equal, round, and reactive to light.  Cardiovascular:     Rate and Rhythm: Normal rate.  Pulmonary:     Effort: Pulmonary effort is normal.     Breath sounds: Normal breath sounds.  Abdominal:     General: Bowel sounds are normal.  Musculoskeletal:     Comments: Positive tenderness palpation over right pelvic area Decreased range of motion in lower extremities secondary to pain  Neurological:     General: No focal deficit present.  Psychiatric:        Mood and Affect: Mood normal.     Data Reviewed:  There are no new results to review at this time. CT PELVIS WO CONTRAST CLINICAL DATA:  89 year old female status post fall 10 days ago with continued pain to the right hip and groin.  EXAM: CT PELVIS WITHOUT CONTRAST  TECHNIQUE: Multidetector CT imaging of the pelvis was performed following the standard protocol without intravenous contrast.  RADIATION DOSE REDUCTION: This exam was performed according to the departmental dose-optimization program which includes automated exposure control, adjustment of the mA and/or kV according to patient size and/or use of iterative reconstruction technique.  COMPARISON:  Pelvis CT on 08/06/2022.  FINDINGS: Urinary Tract: Decompressed bladder with mild mass effect related to oval space-occupying mass in the left space of Retzius which is new (series 4, image 279  and series 8, image 25. Posttraumatic hematoma favored (see pelvis osseous findings below). Kidneys and ureters is *CRASH * that kidneys not included. No ureteral enlargement.  Bowel: No dilated visible small and large bowel. Diverticulosis of the descending and sigmoid colon. No free air or free fluid.  Vascular/Lymphatic: Extensive Aortoiliac calcified atherosclerosis. Normal caliber distal abdominal aorta. Vascular patency is not evaluated in the absence of IV contrast. No lymphadenopathy identified.  Reproductive: Surgically absent uterus. Diminutive or absent  ovaries.  Other: No pelvis free fluid. Incidental numerous pelvic phleboliths.  Musculoskeletal: Diffuse osteopenia. Comminuted but minimally displaced fracture of the right sacral ala is now apparent on series 3, images 26 through 42. SI joints remain symmetric. Left sacral ala appears intact. Visible L4 and L5 vertebrae appear grossly stable and intact.  Bilateral iliac wings are intact. But there is severe comminution and displacement now of the medial right pubic rami, extending to the pubic symphysis. See the inferior ramus on series 3, image 108. And no displacement of the superior ramus relative to the symphysis on series 6 coronal image 28.  Left pubic rami appear stable and intact. No acetabular involvement. Femoral heads remain normally located. Hip joint spaces are stable. Proximal femurs appears stable and intact.  New left space of Retzius 3 x 5 by 2.5 cm oval mass (series 8, image 24) is most compatible with hematoma tracking away from the pubic symphysis in this clinical setting (volume estimated at 19 mL). No other pelvic sidewall hematoma. No discrete intramuscular hematoma.  IMPRESSION: 1. Highly comminuted and displaced fractures of the right pubic rami and pubic symphysis. Displacement (series 6, image 28) and associated left space of Retzius hematoma (19 mL). Comminuted, less displaced fracture  of the right sacral ala. Hips remain intact. Recommend Orthopedic consultation. 2. Underlying Osteopenia. 3.  Aortic Atherosclerosis (ICD10-I70.0).  Electronically Signed   By: Odessa Fleming M.D.   On: 08/16/2022 09:17  Lab Results  Component Value Date   WBC 7.2 08/16/2022   HGB 10.5 (L) 08/16/2022   HCT 32.8 (L) 08/16/2022   MCV 100.0 08/16/2022   PLT 389 08/16/2022   Last metabolic panel Lab Results  Component Value Date   GLUCOSE 89 08/16/2022   NA 136 08/16/2022   K 3.8 08/16/2022   CL 101 08/16/2022   CO2 28 08/16/2022   BUN 12 08/16/2022   CREATININE 0.74 08/16/2022   GFRNONAA >60 08/16/2022   CALCIUM 8.6 (L) 08/16/2022   PHOS 4.2 03/23/2021   PROT 7.1 08/03/2021   ALBUMIN 4.5 08/03/2021   BILITOT 0.5 08/03/2021   ALKPHOS 32 (L) 08/03/2021   AST 20 08/03/2021   ALT 10 08/03/2021   ANIONGAP 7 08/16/2022    Assessment and Plan: * Pelvic fracture (HCC) Noted highly comminuted and displaced fractures of the right pubic rami and pubic symphysis with Retzius hematoma status post traumatic fall Dr. Allena Katz with orthopedics reviewed case-nonoperable Will plan for formal PT OT evaluation and likely rehab Pain control Fall precautions Follow  Fall Recurrent mechanical falls with noted comminuted pelvic fracture on imaging today Suspect secondary to generalized weakness at baseline Fall precautions PT OT evaluation Follow closely  Primary hypertension BP stable Titrate home regimen  Dementia (HCC) Stable. Continue Aricept  Chronic insomnia Continue trazodone   Greater than 50% was spent in counseling and coordination of care with patient Total encounter time 80 minutes or more    Advance Care Planning:   Code Status: DNR   Consults: orthopedics  Family Communication: No family at the bedside   Severity of Illness: The appropriate patient status for this patient is INPATIENT. Inpatient status is judged to be reasonable and necessary in order to  provide the required intensity of service to ensure the patient's safety. The patient's presenting symptoms, physical exam findings, and initial radiographic and laboratory data in the context of their chronic comorbidities is felt to place them at high risk for further clinical deterioration. Furthermore, it is not anticipated that the  patient will be medically stable for discharge from the hospital within 2 midnights of admission.   * I certify that at the point of admission it is my clinical judgment that the patient will require inpatient hospital care spanning beyond 2 midnights from the point of admission due to high intensity of service, high risk for further deterioration and high frequency of surveillance required.*  Author: Floydene Flock, MD 08/16/2022 11:52 AM  For on call review [email or web address].

## 2022-08-16 NOTE — ED Provider Notes (Signed)
[facility] Provider Note    None    (approximate)   History   No chief complaint on file.   HPI  Catherine Young is a 89 y.o. female with history of osteopenia, hypertension, cataracts presents emergency department with continued right hip pain.  Patient was seen on July 13 for a fall.  CT of the head, C-spine, and pelvis were all negative for any acute abnormality.  However the patient does admit to having another near fall as she says she was using her walker and just slid down.  Did not hit her head.  States did not have any pain in her hip when she fell.  Just states she is very unsteady on her feet.  Patient is in independent living at [facility] and is trying to transition to assisted living.  States the pain has gotten so bad that she cannot walk even with a walker.      Physical Exam   Triage Vital Signs: ED Triage Vitals  Encounter Vitals Group     BP 08/16/22 0826 (!) 161/62     Systolic BP Percentile --      Diastolic BP Percentile --      Pulse Rate 08/16/22 0825 (!) 57     Resp 08/16/22 0826 18     Temp 08/16/22 0826 98 F (36.7 C)     Temp src --      SpO2 08/16/22 0825 95 %     Weight 08/16/22 0822 110 lb 3.7 oz (50 kg)     Height 08/16/22 0822 5\' 4"  (1.626 m)     Head Circumference --      Peak Flow --      Pain Score 08/16/22 0825 8     Pain Loc --      Pain Education --      Exclude from Growth Chart --     Most recent vital signs: Vitals:   08/16/22 0825 08/16/22 0826  BP:  (!) 161/62  Pulse: (!) 57 (!) 57  Resp:  18  Temp:  98 F (36.7 C)  SpO2: 95% 95%     General: Awake, no distress.   CV:  Good peripheral perfusion. regular rate and  rhythm Resp:  Normal effort. Lungs cta Abd:  No distention.   Other:  Right hip tender, right foot is inverted, neurovascular is intact, pain is increased with movement of the right leg   ED Results / Procedures / Treatments   Labs (all labs ordered are listed, but only  abnormal results are displayed) Labs Reviewed  BASIC METABOLIC PANEL - Abnormal; Notable for the following components:      Result Value   Calcium 8.6 (*)    All other components within normal limits  CBC WITH DIFFERENTIAL/PLATELET - Abnormal; Notable for the following components:   RBC 3.28 (*)    Hemoglobin 10.5 (*)    HCT 32.8 (*)    All other components within normal limits     EKG     RADIOLOGY CT of the pelvis    PROCEDURES:   Procedures   MEDICATIONS ORDERED IN ED: Medications  morphine (PF) 4 MG/ML injection 4 mg (4 mg Intravenous Given 08/16/22 0944)  ondansetron (ZOFRAN) injection 4 mg (4 mg Intravenous Given 08/16/22 0943)     IMPRESSION / MDM / ASSESSMENT AND PLAN / ED COURSE  I reviewed the triage vital signs and the nursing notes.  Differential diagnosis includes, but is not limited to, fracture, contusion, hematoma  Patient's presentation is most consistent with acute presentation with potential threat to life or bodily function.   Labs are reassuring  CT of the pelvis without contrast shows a comminuted rami fracture with large hematoma, this is independently reviewed and interpreted by me Radiologist report states: 1. Highly comminuted and displaced fractures of the right pubic rami  and pubic symphysis. Displacement (series 6, image 28) and  associated left space of Retzius hematoma (19 mL).  Comminuted, less displaced fracture of the right sacral ala.  Hips remain intact.      Consult orthopedics.  Spoke with Dr. Allena Katz.  States that she is having so much pain and cannot bear weight admit for pain control.  Will call the hospitalist for admission.  Consult to the hospitalist for pain,, Dr. Alvester Morin to admit  FINAL CLINICAL IMPRESSION(S) / ED DIAGNOSES   Final diagnoses:  Closed fracture of right inferior pubic ramus, initial encounter (HCC)  Closed fracture of right side of symphysis pubis with diastasis,  initial encounter (HCC)  Fracture of sacrum, unspecified fracture morphology, sequela  Multiple falls     Rx / DC Orders   ED Discharge Orders     None        Note:  This document was prepared using Dragon voice recognition software and may include unintentional dictation errors.    Faythe Ghee, PA-C 08/16/22 1055    Minna Antis, MD 08/16/22 [phone]

## 2022-08-16 NOTE — ED Triage Notes (Signed)
Presents with pain to right hip/groin area  States she had a fall 10 days ago  Conts to have pain with ambulation

## 2022-08-17 DIAGNOSIS — F03A4 Unspecified dementia, mild, with anxiety: Secondary | ICD-10-CM | POA: Diagnosis not present

## 2022-08-17 DIAGNOSIS — F5104 Psychophysiologic insomnia: Secondary | ICD-10-CM | POA: Diagnosis not present

## 2022-08-17 DIAGNOSIS — I10 Essential (primary) hypertension: Secondary | ICD-10-CM | POA: Diagnosis not present

## 2022-08-17 DIAGNOSIS — S32591A Other specified fracture of right pubis, initial encounter for closed fracture: Secondary | ICD-10-CM

## 2022-08-17 LAB — COMPREHENSIVE METABOLIC PANEL
ALT: 10 U/L (ref 0–44)
AST: 16 U/L (ref 15–41)
Albumin: 3 g/dL — ABNORMAL LOW (ref 3.5–5.0)
Alkaline Phosphatase: 66 U/L (ref 38–126)
Anion gap: 3 — ABNORMAL LOW (ref 5–15)
BUN: 15 mg/dL (ref 8–23)
CO2: 27 mmol/L (ref 22–32)
Calcium: 8 mg/dL — ABNORMAL LOW (ref 8.9–10.3)
Chloride: 104 mmol/L (ref 98–111)
Creatinine, Ser: 0.85 mg/dL (ref 0.44–1.00)
GFR, Estimated: >60 mL/min (ref >60)
Glucose, Bld: 90 mg/dL (ref 70–99)
Potassium: 3.8 mmol/L (ref 3.5–5.1)
Sodium: 134 mmol/L — ABNORMAL LOW (ref 135–145)
Total Bilirubin: 0.5 mg/dL (ref 0.3–1.2)
Total Protein: 5.9 g/dL — ABNORMAL LOW (ref 6.5–8.1)

## 2022-08-17 LAB — CBC
HCT: 32.3 % — ABNORMAL LOW (ref 36.0–46.0)
Hemoglobin: 10.3 g/dL — ABNORMAL LOW (ref 12.0–15.0)
MCH: 32.1 pg (ref 26.0–34.0)
MCHC: 31.9 g/dL (ref 30.0–36.0)
MCV: 100.6 fL — ABNORMAL HIGH (ref 80.0–100.0)
Platelets: 402 10*3/uL — ABNORMAL HIGH (ref 150–400)
RBC: 3.21 MIL/uL — ABNORMAL LOW (ref 3.87–5.11)
RDW: 11.9 % (ref 11.5–15.5)
WBC: 6.2 10*3/uL (ref 4.0–10.5)
nRBC: 0 % (ref 0.0–0.2)

## 2022-08-17 MED ORDER — ORAL CARE MOUTH RINSE: 15.0000 mL | OROMUCOSAL | Status: DC | PRN

## 2022-08-17 MED ORDER — MORPHINE SULFATE (PF) 2 MG/ML IV SOLN: 1.0000 mg | INTRAVENOUS | Status: DC | PRN

## 2022-08-17 MED ORDER — OXYCODONE-ACETAMINOPHEN 7.5-325 MG PO TABS
1.0000 | ORAL_TABLET | ORAL | Status: DC | PRN
  Administered 2022-08-17 – 2022-08-20 (×7): 1 via ORAL
  Administered 2022-08-20 (×2): 2 via ORAL
  Administered 2022-08-21 – 2022-08-23 (×3): 1 via ORAL
  Filled 2022-08-17 (×6): qty 1
  Filled 2022-08-17: qty 2
  Filled 2022-08-17 (×2): qty 1
  Filled 2022-08-17: qty 2
  Filled 2022-08-17 (×2): qty 1

## 2022-08-17 MED ORDER — VITAMIN D 25 MCG (1000 UNIT) PO TABS
1000.0000 [IU] | ORAL_TABLET | Freq: Every day | ORAL | Status: DC
  Administered 2022-08-17 – 2022-08-23 (×7): 1000 [IU] via ORAL
  Filled 2022-08-17 (×7): qty 1

## 2022-08-17 NOTE — Progress Notes (Signed)
[facility]  - [place] at [facility]   PATIENT NAME: Catherine Young    MR#:  [id number]  DATE OF BIRTH:  06-Mar-1931  SUBJECTIVE:  patient came in after accidental fall while doing chair yoga. Lives at [facility] independent facility. No family at bedside. Complains of right hip pain. Worked earlier with occupational therapist    VITALS:  Blood pressure (!) 138/57, pulse (!) 58, temperature 98.3 F (36.8 C), resp. rate 18, height 5\' 4"  (1.626 m), weight 50 kg, SpO2 93%.  PHYSICAL EXAMINATION:   GENERAL:  89 y.o.-year-old patient with no acute distress. thin LUNGS: Normal breath sounds bilaterally, no wheezing CARDIOVASCULAR: S1, S2 normal. No murmur   ABDOMEN: Soft, nontender, nondistended. Bowel sounds present.  EXTREMITIES: No  edema b/l.    NEUROLOGIC: nonfocal  patient is alert and awake SKIN: No obvious rash, lesion, or ulcer.   LABORATORY PANEL:  CBC Recent Labs  Lab 08/17/22 0401  WBC 6.2  HGB 10.3*  HCT 32.3*  PLT 402*    Chemistries  Recent Labs  Lab 08/17/22 0401  NA 134*  K 3.8  CL 104  CO2 27  GLUCOSE 90  BUN 15  CREATININE 0.85  CALCIUM 8.0*  AST 16  ALT 10  ALKPHOS 66  BILITOT 0.5   Cardiac Enzymes No results for input(s): "TROPONINI" in the last 168 hours. RADIOLOGY:  CT PELVIS WO CONTRAST  Result Date: 08/16/2022 CLINICAL DATA:  89 year old female status post fall 10 days ago with continued pain to the right hip and groin. EXAM: CT PELVIS WITHOUT CONTRAST TECHNIQUE: Multidetector CT imaging of the pelvis was performed following the standard protocol without intravenous contrast. RADIATION DOSE REDUCTION: This exam was performed according to the departmental dose-optimization program which includes automated exposure control, adjustment of the mA and/or kV according to patient size and/or use of iterative reconstruction technique. COMPARISON:  Pelvis CT on 08/06/2022. FINDINGS: Urinary Tract: Decompressed bladder with mild  mass effect related to oval space-occupying mass in the left space of Retzius which is new (series 4, image 279 and series 8, image 25. Posttraumatic hematoma favored (see pelvis osseous findings below). Kidneys and ureters is *CRASH * that kidneys not included. No ureteral enlargement. Bowel: No dilated visible small and large bowel. Diverticulosis of the descending and sigmoid colon. No free air or free fluid. Vascular/Lymphatic: Extensive Aortoiliac calcified atherosclerosis. Normal caliber distal abdominal aorta. Vascular patency is not evaluated in the absence of IV contrast. No lymphadenopathy identified. Reproductive: Surgically absent uterus. Diminutive or absent ovaries. Other: No pelvis free fluid. Incidental numerous pelvic phleboliths. Musculoskeletal: Diffuse osteopenia. Comminuted but minimally displaced fracture of the right sacral ala is now apparent on series 3, images 26 through 42. SI joints remain symmetric. Left sacral ala appears intact. Visible L4 and L5 vertebrae appear grossly stable and intact. Bilateral iliac wings are intact. But there is severe comminution and displacement now of the medial right pubic rami, extending to the pubic symphysis. See the inferior ramus on series 3, image 108. And no displacement of the superior ramus relative to the symphysis on series 6 coronal image 28. Left pubic rami appear stable and intact. No acetabular involvement. Femoral heads remain normally located. Hip joint spaces are stable. Proximal femurs appears stable and intact. New left space of Retzius 3 x 5 by 2.5 cm oval mass (series 8, image 24) is most compatible with hematoma tracking away from the pubic symphysis in this clinical setting (volume estimated at 19 mL). No other  pelvic sidewall hematoma. No discrete intramuscular hematoma. IMPRESSION: 1. Highly comminuted and displaced fractures of the right pubic rami and pubic symphysis. Displacement (series 6, image 28) and associated left space of  Retzius hematoma (19 mL). Comminuted, less displaced fracture of the right sacral ala. Hips remain intact. Recommend Orthopedic consultation. 2. Underlying Osteopenia. 3.  Aortic Atherosclerosis (ICD10-I70.0). Electronically Signed   By: Odessa Fleming M.D.   On: 08/16/2022 09:17    Assessment and Plan  Catherine Young is a 89 y.o. female with medical history significant of depression, hypertension, dementia presenting with fall and pelvic fracture.  Patient noted had a fall around July 13.  Was evaluated in the ER with normal imaging.  Patient reports going back to facility and having a subsequent fall approximately 5 days later after a session fo chair yoga her balance went off and fell.   CT pelvis: Highly comminuted and displaced fractures of the right pubic rami and pubic symphysis, Retzius hematoma.  Right sacral alla comminuted fracture.    Pelvic fracture (HCC), right S/p accidental fall --Noted highly comminuted and displaced fractures of the right pubic rami and pubic symphysis with Retzius hematoma status post traumatic fall --Dr. Allena Katz with orthopedics reviewed case-nonoperable -- PT OT evaluation and pt will benefit from rehab --Pain control ---Fall precautions  Vitamin D deficiency -- will replace orally    Primary hypertension --BP stable --Titrate home regimen   Dementia (HCC) --Continue Aricept   Chronic insomnia --Continue trazodone   Family communication :son fred Rousseau Consults :ortho CODE STATUS: DNR DVT Prophylaxis : Level of care: Med-Surg Status is: Observation The patient remains OBS appropriate and will d/c before 2 midnights. TOC for discharge planning to rehab. Patient is a twin like independent resident.    TOTAL TIME TAKING CARE OF THIS PATIENT: 35 minutes.  >50% time spent on counselling and coordination of care  Note: This dictation was prepared with Dragon dictation along with smaller phrase technology. Any transcriptional errors that result  from this process are unintentional.  Enedina Finner M.D    [facility]   CC: Primary care physician; Excell Seltzer, MD

## 2022-08-17 NOTE — Progress Notes (Signed)
Occupational Therapy Treatment Patient Details Name: Catherine Young MRN: [id number] DOB: 30-May-1931 Today's Date: 08/17/2022   History of present illness Catherine Young is a 89 y.o. female with history of osteopenia, hypertension, cataracts presents emergency department with continued right hip pain.  Patient was seen on July 13 for a fall.  CT of the head, C-spine, and pelvis were all negative for any acute abnormality. Returned to ED following additional fall and persistent pain. Repeat CT scan shows right sacral ala fracture, inferior pubic ramus fracture, and superior pubic ramus fractures.   OT comments  Pt received semi-reclined in bed. Appearing alert; stating "they won't let me get out of bed; this bed talks to me!"; willing to work with OT on transfer to [facility] and then to chair. T/f CGA and cues with RW and gait belt for safety. See flowsheet below for further details of session. Left seated in recliner at end of session with all needs in reach.  Patient will benefit from continued OT while in acute care.    Recommendations for follow up therapy are one component of a multi-disciplinary discharge planning process, led by the attending physician.  Recommendations may be updated based on patient status, additional functional criteria and insurance authorization.    Assistance Recommended at Discharge Intermittent Supervision/Assistance  Patient can return home with the following  A little help with walking and/or transfers;A lot of help with bathing/dressing/bathroom;Assistance with cooking/housework;Direct supervision/assist for medications management;Direct supervision/assist for financial management;Assist for transportation;Help with stairs or ramp for entrance   Equipment Recommendations  Other (comment) (defer to next venue of care)    Recommendations for Other Services      Precautions / Restrictions Precautions Precautions: Fall Restrictions Weight Bearing  Restrictions: No Other Position/Activity Restrictions: per ortho consult note: WBAT       Mobility Bed Mobility Overal bed mobility: Needs Assistance Bed Mobility: Supine to Sit     Supine to sit: Supervision     General bed mobility comments: HOB elevated, extra time    Transfers Overall transfer level: Needs assistance Equipment used: Rolling walker (2 wheels) Transfers: Sit to/from Stand Sit to Stand: Min guard           General transfer comment: cues and RW; she first tried unsuccessfully with BIL hands on RW; much more successful with unilateral hand on bed and other hand on RW.     Balance Overall balance assessment: Needs assistance Sitting-balance support: Bilateral upper extremity supported, Feet supported Sitting balance-Leahy Scale: Good     Standing balance support: Bilateral upper extremity supported, During functional activity, Reliant on assistive device for balance Standing balance-Leahy Scale: Fair Standing balance comment: mild unsteadiness; reliant on RW for support due to pain with mobility                           ADL either performed or assessed with clinical judgement   ADL Overall ADL's : Needs assistance/impaired Eating/Feeding: Set up;Sitting Eating/Feeding Details (indicate cue type and reason): set up for drinking ice water provided today                 Lower Body Dressing: Moderate assistance Lower Body Dressing Details (indicate cue type and reason): OT assisted to thread new underwear with incontinence pad while seated; CGA while pt standing for her to pull them up over hips Toilet Transfer: Min guard;Cueing for safety;BSC/3in1;Rolling walker (2 wheels) Toilet Transfer Details (indicate cue type and reason): Requires  cues for hand placement on arm rests of BSC. Toileting- Clothing Manipulation and Hygiene: Min guard;Minimal assistance;Cueing for safety;Sitting/lateral lean Toileting - Clothing Manipulation Details  (indicate cue type and reason): CGA-MIN A while standing; pt leaning far and needing CGA for safety            Extremity/Trunk Assessment Upper Extremity Assessment Upper Extremity Assessment: Overall WFL for tasks assessed   Lower Extremity Assessment Lower Extremity Assessment: Generalized weakness        Vision       Perception     Praxis      Cognition Arousal/Alertness: Awake/alert Behavior During Therapy: WFL for tasks assessed/performed Overall Cognitive Status: No family/caregiver present to determine baseline cognitive functioning                                 General Comments: Pt is alert and grossly oriented. Needing some assist with ordering meal and some date confusion noted today, but overall able to express her needs.        Exercises      Shoulder Instructions       General Comments on room air. Pleasant; motivated. Able to walk about 4 feet to [facility] near bed, then another 4 feet from [facility] to chair.    Pertinent Vitals/ Pain       Pain Assessment Pain Assessment: 0-10 Pain Score: 8  Pain Location: Hip on R Pain Descriptors / Indicators: Sharp Pain Intervention(s): Limited activity within patient's tolerance, Monitored during session, RN gave pain meds during session  Home Living                                          Prior Functioning/Environment              Frequency  Min 1X/week        Progress Toward Goals  OT Goals(current goals can now be found in the care plan section)  Progress towards OT goals: Progressing toward goals  Acute Rehab OT Goals Patient Stated Goal: Get better; move to ALF OT Goal Formulation: With patient Time For Goal Achievement: 08/30/22 Potential to Achieve Goals: Good ADL Goals Pt Will Perform Grooming: with modified independence;standing Pt Will Perform Lower Body Dressing: with modified independence;sit to/from stand Pt Will Transfer to Toilet: with modified  independence;bedside commode;ambulating Pt Will Perform Toileting - Clothing Manipulation and hygiene: with modified independence;sit to/from stand  Plan Discharge plan remains appropriate    Co-evaluation                 AM-PAC OT "6 Clicks" Daily Activity     Outcome Measure   Help from another person eating meals?: None Help from another person taking care of personal grooming?: A Little Help from another person toileting, which includes using toliet, bedpan, or urinal?: A Little Help from another person bathing (including washing, rinsing, drying)?: A Lot Help from another person to put on and taking off regular upper body clothing?: None Help from another person to put on and taking off regular lower body clothing?: A Lot 6 Click Score: 18    End of Session Equipment Utilized During Treatment: Gait belt;Rolling walker (2 wheels);Other (comment) (BSC)  OT Visit Diagnosis: Unsteadiness on feet (R26.81);Repeated falls (R29.6)   Activity Tolerance Patient tolerated treatment well   Patient Left in chair;with  call bell/phone within reach;with chair alarm set   Nurse Communication Mobility status        Time: 1610-9604 OT Time Calculation (min): 32 min  Charges: OT General Charges $OT Visit: 1 Visit OT Treatments $Self Care/Home Management : 23-37 mins  Linward Foster, MS, OTR/L  Alvester Morin 08/17/2022, 11:25 AM

## 2022-08-17 NOTE — Progress Notes (Signed)
Physical Therapy Treatment Patient Details Name: Catherine Young MRN: [id number] DOB: [birth year] Today's Date: 08/17/2022   History of Present Illness Catherine Young is a 89 y.o. female with history of osteopenia, hypertension, cataracts presents emergency department with continued right hip pain.  Patient was seen on July 13 for a fall.  CT of the head, C-spine, and pelvis were all negative for any acute abnormality. Returned to ED following additional fall and persistent pain. Repeat CT scan shows right sacral ala fracture, inferior pubic ramus fracture, and superior pubic ramus fractures.    PT Comments  Pt was sitting in recliner with feet on floor upon arriving. She is alert and cooperative but does have some cognition concerns come to light throughout session. Pleasant and motivated. Was able to progress to standing and ambulating into hallway ~ 50 ft with sue of RW. Pt tends to ambulate with narrow BOS and poor gait posture. Once returned to room, pt sat on [facility] and successfully urinated prior to returning to sitting in recliner. Overall, pt will greatly benefit from continued skilled PT to maximize independence and safety with all ADLs. DC recs remain appropriate.     Assistance Recommended at Discharge Intermittent Supervision/Assistance  If plan is discharge home, recommend the following:  Can travel by private vehicle    A little help with walking and/or transfers;A little help with bathing/dressing/bathroom;Assistance with cooking/housework;Assist for transportation;Help with stairs or ramp for entrance      Equipment Recommendations  Other (comment) (Defer to next level of care)       Precautions / Restrictions Precautions Precautions: Fall Restrictions Weight Bearing Restrictions: Yes Other Position/Activity Restrictions: per ortho consult note: WBAT     Mobility  Bed Mobility Overal bed mobility: Needs Assistance Bed Mobility: Supine to Sit  Supine to sit:  Supervision  General bed mobility comments: HOB elevated, extra time required with vcs.    Transfers Overall transfer level: Needs assistance Equipment used: Rolling walker (2 wheels) Transfers: Sit to/from Stand Sit to Stand: Min guard  General transfer comment: Pt required CGA for safety with increased time and vcs.    Ambulation/Gait Ambulation/Gait assistance: Min guard Gait Distance (Feet): 50 Feet Assistive device: Rolling walker (2 wheels) Gait Pattern/deviations: Decreased step length - right, Decreased step length - left, Narrow base of support  General Gait Details: Pt ambulated ~ 50 ft with RW + CGA. vcs for wider BOS and improved posture. She tolerated well but was limited by pain overall.    Balance Overall balance assessment: Needs assistance Sitting-balance support: Bilateral upper extremity supported, Feet supported Sitting balance-Leahy Scale: Good     Standing balance support: Bilateral upper extremity supported, During functional activity, Reliant on assistive device for balance Standing balance-Leahy Scale: Fair     Cognition Arousal/Alertness: Awake/alert Behavior During Therapy: WFL for tasks assessed/performed Overall Cognitive Status: No family/caregiver present to determine baseline cognitive functioning    General Comments: Pt is alert and grossly oriented. Needing some assist with ordering meal and some date confusion noted today, but overall able to express her needs.           General Comments General comments (skin integrity, edema, etc.): Pt recommended to perform more exercises throughout the day to promote strengthenign and return in function.      Pertinent Vitals/Pain Pain Assessment Pain Assessment: 0-10 Pain Score: 7  Pain Location: Hip on R Pain Descriptors / Indicators: Discomfort, Guarding Pain Intervention(s): Limited activity within patient's tolerance, Premedicated before session, Monitored during session,  Repositioned      PT Goals (current goals can now be found in the care plan section) Acute Rehab PT Goals Patient Stated Goal: "Get stronger and move better." Progress towards PT goals: Progressing toward goals    Frequency    Min 1X/week      PT Plan Current plan remains appropriate       AM-PAC PT "6 Clicks" Mobility   Outcome Measure  Help needed turning from your back to your side while in a flat bed without using bedrails?: None Help needed moving from lying on your back to sitting on the side of a flat bed without using bedrails?: A Little Help needed moving to and from a bed to a chair (including a wheelchair)?: A Little Help needed standing up from a chair using your arms (e.g., wheelchair or bedside chair)?: A Little Help needed to walk in hospital room?: A Lot Help needed climbing 3-5 steps with a railing? : A Lot 6 Click Score: 17    End of Session   Activity Tolerance: Patient tolerated treatment well;Patient limited by pain Patient left: in chair;with chair alarm set Nurse Communication: Mobility status PT Visit Diagnosis: Unsteadiness on feet (R26.81);History of falling (Z91.81);Muscle weakness (generalized) (M62.81);Other abnormalities of gait and mobility (R26.89)     Time: 4098-1191 PT Time Calculation (min) (ACUTE ONLY): 27 min  Charges:    $Gait Training: 8-22 mins $Therapeutic Activity: 8-22 mins PT General Charges $$ ACUTE PT VISIT: 1 Visit                    Jetta Lout PTA 08/17/22, 3:07 PM

## 2022-08-17 NOTE — Plan of Care (Signed)

## 2022-08-18 DIAGNOSIS — S32591A Other specified fracture of right pubis, initial encounter for closed fracture: Secondary | ICD-10-CM | POA: Diagnosis not present

## 2022-08-18 DIAGNOSIS — F5104 Psychophysiologic insomnia: Secondary | ICD-10-CM | POA: Diagnosis not present

## 2022-08-18 DIAGNOSIS — I10 Essential (primary) hypertension: Secondary | ICD-10-CM | POA: Diagnosis not present

## 2022-08-18 DIAGNOSIS — F03A4 Unspecified dementia, mild, with anxiety: Secondary | ICD-10-CM | POA: Diagnosis not present

## 2022-08-18 NOTE — Plan of Care (Signed)
  Problem: Health Behavior/Discharge Planning: Goal: Ability to manage health-related needs will improve Outcome: Progressing   Problem: Clinical Measurements: Goal: Ability to maintain clinical measurements within normal limits will improve Outcome: Progressing   Problem: Clinical Measurements: Goal: Diagnostic test results will improve Outcome: Progressing   Problem: Activity: Goal: Risk for activity intolerance will decrease Outcome: Progressing

## 2022-08-18 NOTE — Progress Notes (Signed)
Occupational Therapy Treatment Patient Details Name: Catherine Young MRN: [id number] DOB: [birth year] Today's Date: 08/18/2022   History of present illness Catherine Young is a 89 y.o. female with history of osteopenia, hypertension, cataracts presents emergency department with continued right hip pain.  Patient was seen on July 13 for a fall.  CT of the head, C-spine, and pelvis were all negative for any acute abnormality. Returned to ED following additional fall and persistent pain. Repeat CT scan shows right sacral ala fracture, inferior pubic ramus fracture, and superior pubic ramus fractures.   OT comments  Chart reviewed, pt greeted in bed, agreeable to OT tx session targeting improving independence and functional performance during ADL tasks. Pt is alert and oriented, pleasant but mildly impulsive requiring vcs for attention to task throughout. Improvements noted in LB dressing/bathing, overall mobility with pt amb in room approx 20' with RW with CGA. Pt is making progress towards goals, discharge recommendation remains appropriate. OT will continue to follow acutely.   Recommendations for follow up therapy are one component of a multi-disciplinary discharge planning process, led by the attending physician.  Recommendations may be updated based on patient status, additional functional criteria and insurance authorization.    Assistance Recommended at Discharge Intermittent Supervision/Assistance  Patient can return home with the following  A little help with walking and/or transfers;A lot of help with bathing/dressing/bathroom;Assistance with cooking/housework;Direct supervision/assist for medications management;Direct supervision/assist for financial management;Assist for transportation;Help with stairs or ramp for entrance   Equipment Recommendations  Other (comment) (per next venue of care)    Recommendations for Other Services      Precautions / Restrictions  Precautions Precautions: Fall Restrictions Weight Bearing Restrictions: Yes Other Position/Activity Restrictions: per ortho consult note: WBAT       Mobility Bed Mobility Overal bed mobility: Needs Assistance Bed Mobility: Supine to Sit, Sit to Supine     Supine to sit: Supervision Sit to supine: Supervision, HOB elevated   General bed mobility comments: intermittent vcs for attention to task at hand    Transfers Overall transfer level: Needs assistance Equipment used: Rolling walker (2 wheels) Transfers: Sit to/from Stand Sit to Stand: Min guard                 Balance Overall balance assessment: Needs assistance Sitting-balance support: Feet supported Sitting balance-Leahy Scale: Good     Standing balance support: Bilateral upper extremity supported, During functional activity, Reliant on assistive device for balance Standing balance-Leahy Scale: Fair                             ADL either performed or assessed with clinical judgement   ADL Overall ADL's : Needs assistance/impaired     Grooming: Wash/dry hands;Wash/dry face;Sitting;Set up Grooming Details (indicate cue type and reason): at edge of bed Upper Body Bathing: Minimal assistance;Sitting;Cueing for sequencing Upper Body Bathing Details (indicate cue type and reason): for thoroughness, pt did attempt to wash face with wash cloth after washing her arm pits Lower Body Bathing: Min guard;Minimal assistance;Sit to/from stand;Cueing for sequencing   Upper Body Dressing : Min guard;Sitting Upper Body Dressing Details (indicate cue type and reason): gown Lower Body Dressing: Minimal assistance;Sitting/lateral leans;Sit to/from stand Lower Body Dressing Details (indicate cue type and reason): threading underwear, then STS with intermittent vcs for sequencing; sitting/lateral leans for donning socks with MIN A, no vcs for sequencing Toilet Transfer: Min guard;Rolling walker (2  wheels);Ambulation;Cueing for safety;Cueing for sequencing  Toilet Transfer Details (indicate cue type and reason): simulated Toileting- Clothing Manipulation and Hygiene: Cueing for safety;Cueing for sequencing;Sit to/from stand;Minimal assistance       Functional mobility during ADLs: Rolling walker (2 wheels);Min guard;Cueing for safety;Cueing for sequencing (approx 20' in room)        Cognition Arousal/Alertness: Awake/alert Behavior During Therapy: WFL for tasks assessed/performed Overall Cognitive Status: No family/caregiver present to determine baseline cognitive functioning Area of Impairment: Attention, Memory, Following commands, Safety/judgement, Awareness, Problem solving                   Current Attention Level: Sustained Memory: Decreased short-term memory Following Commands: Follows one step commands consistently, Follows one step commands with increased time   Awareness: Emergent Problem Solving: Requires verbal cues, Requires tactile cues General Comments: pt is pleasant, cooperative, mildly impulsive                   Pertinent Vitals/ Pain       Pain Assessment Pain Assessment: 0-10 Pain Score: 7  Pain Location: with mobility Pain Descriptors / Indicators: Discomfort, Guarding Pain Intervention(s): Limited activity within patient's tolerance, Monitored during session (reported to RN)         Frequency  Min 1X/week        Progress Toward Goals  OT Goals(current goals can now be found in the care plan section)  Progress towards OT goals: Progressing toward goals     Plan Discharge plan remains appropriate    Co-evaluation        PT goals addressed during session: Mobility/safety with mobility;Balance;Proper use of DME;Strengthening/ROM        AM-PAC OT "6 Clicks" Daily Activity     Outcome Measure   Help from another person eating meals?: None Help from another person taking care of personal grooming?: A Little Help from  another person toileting, which includes using toliet, bedpan, or urinal?: A Little Help from another person bathing (including washing, rinsing, drying)?: A Little Help from another person to put on and taking off regular upper body clothing?: None Help from another person to put on and taking off regular lower body clothing?: A Little 6 Click Score: 20    End of Session Equipment Utilized During Treatment: Rolling walker (2 wheels)  OT Visit Diagnosis: Unsteadiness on feet (R26.81);Repeated falls (R29.6)   Activity Tolerance Patient tolerated treatment well   Patient Left with call bell/phone within reach;in bed;with bed alarm set   Nurse Communication Mobility status (reported pain)        Time: 1431-1451 OT Time Calculation (min): 20 min  Charges: OT General Charges $OT Visit: 1 Visit OT Treatments $Self Care/Home Management : 8-22 mins  Catherine Young, OTD OTR/L  08/18/22, 3:17 PM

## 2022-08-18 NOTE — NC FL2 (Signed)
MEDICAID [id number] LEVEL OF CARE FORM     IDENTIFICATION  Patient Name: Catherine Young Birthdate: 08-25-1931 Sex: female Admission Date (Current Location): 08/16/2022  [facility] and [place] Number:  Chiropodist and Address:  [facility], [address] [place], [place] [address]      Provider Number: [id number]  Attending Physician Name and Address:  Enedina Finner, MD  Relative Name and Phone Number:  Zenita Kister [phone]    Current Level of Care: Hospital Recommended Level of Care: Skilled Nursing Facility Prior Approval Number:    Date Approved/Denied:   PASRR Number: Pending  Discharge Plan: SNF    Current Diagnoses: Patient Active Problem List   Diagnosis Date Noted   Closed fracture of right inferior pubic ramus (HCC) 08/17/2022   Pelvic fracture (HCC) 08/16/2022   Fall 08/16/2022   Hematuria 06/29/2022   Dizziness 04/19/2022   Frequent falls 04/19/2022   Bilateral hip pain 09/03/2021   Primary hypertension 03/23/2021   Decreased glomerular filtration rate (GFR) 03/23/2021   Hordeolum externum of right lower eyelid 03/23/2021   Closed compression fracture of body of L1 vertebra (HCC) 10/27/2020   Absence of bladder continence 05/25/2020   Diarrhea 12/24/2019   Syncope 12/02/2019   Musculoskeletal pain 12/02/2019   Dementia (HCC) 04/30/2019   Spinal stenosis of lumbar region 12/27/2018   Chronic pain of right knee 12/15/2017   Chronic insomnia 01/05/2017   Moderate recurrent major depression (HCC) 09/20/2016   History of intestinal obstruction 01/13/2015   Osteoporosis 01/13/2015   GAD (generalized anxiety disorder) 01/13/2015   Arthritis 01/13/2015    Orientation RESPIRATION BLADDER Height & Weight     Self, Time  Normal Incontinent Weight: 50 kg Height:  5\' 4"  (162.6 cm)  BEHAVIORAL SYMPTOMS/MOOD NEUROLOGICAL BOWEL NUTRITION STATUS      Incontinent  (See Discharge Summary)  AMBULATORY STATUS  COMMUNICATION OF NEEDS Skin   Extensive Assist Verbally Normal                       Personal Care Assistance Level of Assistance  Bathing, Feeding, Dressing Bathing Assistance: Maximum assistance Feeding assistance: Limited assistance Dressing Assistance: Maximum assistance     Functional Limitations Info  Sight, Hearing, Speech Sight Info: Impaired (Wears glasses) Hearing Info: Adequate Speech Info: Adequate    SPECIAL CARE FACTORS FREQUENCY  PT (By licensed PT), OT (By licensed OT)     PT Frequency: 5x weekly OT Frequency: 5x weekly            Contractures Contractures Info: Not present    Additional Factors Info  Code Status, Allergies Code Status Info: DNR Allergies Info: Fosamax (Alendronate), Prednisone           Current Medications (08/18/2022):  This is the current hospital active medication list Current Facility-Administered Medications  Medication Dose Route Frequency Provider Last Rate Last Admin   amLODipine (NORVASC) tablet 2.5 mg  2.5 mg Oral Daily Floydene Flock, MD   2.5 mg at 08/18/22 0857   cholecalciferol (VITAMIN D3) 25 MCG (1000 UNIT) tablet 1,000 Units  1,000 Units Oral Daily Enedina Finner, MD   1,000 Units at 08/18/22 0857   clonazePAM (KLONOPIN) tablet 0.5 mg  0.5 mg Oral BID PRN Floydene Flock, MD   0.5 mg at 08/16/22 2125   donepezil (ARICEPT) tablet 10 mg  10 mg Oral QHS Floydene Flock, MD   10 mg at 08/16/22 2125   enoxaparin (LOVENOX) injection  40 mg  40 mg Subcutaneous Q24H Floydene Flock, MD   40 mg at 08/16/22 2128   morphine (PF) 2 MG/ML injection 1-2 mg  1-2 mg Intravenous Q4H PRN Enedina Finner, MD       ondansetron [place] [facility]) tablet 4 mg  4 mg Oral Q6H PRN Floydene Flock, MD       Or   ondansetron [facility]) injection 4 mg  4 mg Intravenous Q6H PRN Floydene Flock, MD       Oral care mouth rinse  15 mL Mouth Rinse PRN Floydene Flock, MD       oxyCODONE-acetaminophen (PERCOCET) 7.5-325 MG per tablet 1-2 tablet  1-2 tablet  Oral Q4H PRN Enedina Finner, MD   1 tablet at 08/18/22 0857   sertraline (ZOLOFT) tablet 100 mg  100 mg Oral QHS Floydene Flock, MD   100 mg at 08/16/22 2125   traZODone (DESYREL) tablet 100 mg  100 mg Oral QHS PRN Floydene Flock, MD   100 mg at 08/16/22 2125     Discharge Medications: Please see discharge summary for a list of discharge medications.  Relevant Imaging Results:  Relevant Lab Results:   Additional Information [id number]  Garret Reddish, RN

## 2022-08-18 NOTE — TOC Initial Note (Signed)
Transition of Care [facility]) - Initial/Assessment Note    Patient Details  Name: Catherine Young MRN: [id number] Date of Birth: Jun 16, 1931  [facility] of [facility] CM/SW Contact:    Garret Reddish, RN Phone Number: 08/18/2022, 11:04 AM  Clinical Narrative:     Chart reviewed.  Noted that patient was admitted with Pelvic fracture.   Orthopedics has consulted on patient and no surgical intervention is planned at this time.    I have spoken with patient's son Ivyrose Hashman. He informs me that prior to admission patient was living at [facility] facility.  Patient was able to get around the facility prior to admission with out any assistive devices.  Informed Mr. Vonita Moss about SNF recommendation.  Mr. Luzader would like for Mrs. Resetar to go to SNF rehab at [facility].  I have explained the SNF process.    I have spoken with Sue Lush, Admission Coordinator at [facility].  She reports that she can accept patient at their SNF. Sue Lush reports that patient would need insurance approval.    I have completed FL2, and submitted for PASSR.  Patient's PASSR is pending at this time.   I have also informed Mr. Mehlhaff that patient is not meeting inpatient criteria at this time. Patient is considered observation at this time.  [place] letter explained to patient's son Kelsy Polack. He verbalizes understanding.    Information sent to [facility].           Expected Discharge Plan: Skilled Nursing Facility Barriers to Discharge: No Barriers Identified   Patient Goals and CMS Choice     Choice offered to / list presented to : Patient, Adult Children (Patient and son would like to return back to [facility]      Expected Discharge Plan and Services   Discharge Planning Services: CM Consult Post Acute Care Choice: Skilled Nursing Facility Living arrangements for the past 2 months: Independent Living Facility (Patient was a resident at [facility]  facility.)                                      Prior Living Arrangements/Services Living arrangements for the past 2 months: Independent Living Facility (Patient was a resident at [facility] facility.) Lives with:: Self Patient language and need for interpreter reviewed:: Yes Do you feel safe going back to the place where you live?: Yes        Care giver support system in place?: Yes (comment) (Patient has a supportive son)      Activities of Daily Living Home Assistive Devices/Equipment: None ADL Screening (condition at time of admission) Patient's cognitive ability adequate to safely complete daily activities?: Yes Is the patient deaf or have difficulty hearing?: No Does the patient have difficulty seeing, even when wearing glasses/contacts?: No Does the patient have difficulty concentrating, remembering, or making decisions?: No Patient able to express need for assistance with ADLs?: Yes Does the patient have difficulty dressing or bathing?: No Independently performs ADLs?: No Feeding: Independent Toileting: Needs assistance In/Out Bed: Needs assistance Does the patient have difficulty walking or climbing stairs?: Yes Weakness of Legs: Right Weakness of Arms/Hands: None  Permission Sought/Granted                  Emotional Assessment     Affect (typically observed): Appropriate Orientation: : Oriented to Self, Oriented to  Place      Admission diagnosis:  Pelvic fracture (HCC) [S32.9XXA] Multiple falls [R29.6] Closed fracture of right inferior pubic ramus, initial encounter (HCC) [S32.591A] Closed fracture of right side of symphysis pubis with diastasis, initial encounter (HCC) [S32.591A] Fracture of sacrum, unspecified fracture morphology, sequela [S32.10XS] Patient Active Problem List   Diagnosis Date Noted   Closed fracture of right inferior pubic ramus (HCC) 08/17/2022   Pelvic fracture (HCC) 08/16/2022   Fall 08/16/2022    Hematuria 06/29/2022   Dizziness 04/19/2022   Frequent falls 04/19/2022   Bilateral hip pain 09/03/2021   Primary hypertension 03/23/2021   Decreased glomerular filtration rate (GFR) 03/23/2021   Hordeolum externum of right lower eyelid 03/23/2021   Closed compression fracture of body of L1 vertebra (HCC) 10/27/2020   Absence of bladder continence 05/25/2020   Diarrhea 12/24/2019   Syncope 12/02/2019   Musculoskeletal pain 12/02/2019   Dementia (HCC) 04/30/2019   Spinal stenosis of lumbar region 12/27/2018   Chronic pain of right knee 12/15/2017   Chronic insomnia 01/05/2017   Moderate recurrent major depression (HCC) 09/20/2016   History of intestinal obstruction 01/13/2015   Osteoporosis 01/13/2015   GAD (generalized anxiety disorder) 01/13/2015   Arthritis 01/13/2015   PCP:  Excell Seltzer, MD Pharmacy:   [id number] [place], [place] - [address] AT [facility] [address] [facility] [address] [place] [address] Phone: [phone] Fax: [phone]  [facility] - [place], [place] - [address] [place] [address] Phone: [phone] Fax: [phone]     Social Determinants of Health (SDOH) Social History: SDOH Screenings   Food Insecurity: No Food Insecurity (08/16/2022)  Housing: Low Risk  (08/16/2022)  Transportation Needs: No Transportation Needs (08/16/2022)  Utilities: Not At Risk (08/16/2022)  Alcohol Screen: Low Risk  (07/25/2022)  Depression (PHQ2-9): Low Risk  (07/25/2022)  Financial Resource Strain: Low Risk  (07/25/2022)  Physical Activity: Insufficiently Active (07/25/2022)  Social Connections: Socially Isolated (07/25/2022)  Stress: No Stress Concern Present (07/25/2022)  Tobacco Use: Medium Risk (08/16/2022)   SDOH Interventions:     Readmission Risk Interventions     No data to display

## 2022-08-18 NOTE — Progress Notes (Signed)
Pt paranoid and more confused during this shift than the night before.  She refused all of her HS meds and SCDs despite encouragement and education (Dr.Mansy notified of pt's refusal of Lovenox).  She has not slept through the night and declined offer of Trazodone or Clonazepam.

## 2022-08-18 NOTE — Care Management CC44 (Addendum)
Condition Code 44 Documentation Completed  Patient Details  Name: Catherine Young MRN: [id number] Date of Birth: 05-27-1931   Condition Code 44 given:  Yes Patient signature on Condition Code 44 notice:  Yes (Spoke to patient's son Maycie Luera) Documentation of 2 MD's agreement:  Yes Code 44 added to claim:  Yes    Garret Reddish, RN 08/18/2022, 11:50 AM

## 2022-08-18 NOTE — Progress Notes (Signed)
Physical Therapy Treatment Patient Details Name: Catherine Young MRN: [id number] DOB: Jun 09, 1931 Today's Date: 08/18/2022   History of Present Illness Catherine Young is a 89 y.o. female with history of osteopenia, hypertension, cataracts presents emergency department with continued right hip pain.  Patient was seen on July 13 for a fall.  CT of the head, C-spine, and pelvis were all negative for any acute abnormality. Returned to ED following additional fall and persistent pain. Repeat CT scan shows right sacral ala fracture, inferior pubic ramus fracture, and superior pubic ramus fractures.    PT Comments  Pt was long sitting in bed upon arrival. Does correctly answer orientation questions today but has cognition deficits come to light during session. Pt was able to exit L side of bed, stand to RW, and ambulate a lap in hallway. Poor overall gait kinematics with narrow BOS and toe walking. Vcs for improved heel strike however pt unable to coordinate movements/motor plan to improve gait safety. She remains high fall risk. Highly recommend continued skilled PT at DC to maximize independence and safety with all ADLs.      Assistance Recommended at Discharge Intermittent Supervision/Assistance  If plan is discharge home, recommend the following:  Can travel by private vehicle    A little help with walking and/or transfers;A little help with bathing/dressing/bathroom;Assistance with cooking/housework;Assist for transportation;Help with stairs or ramp for entrance      Equipment Recommendations  Other (comment) (Defer to next level of care)       Precautions / Restrictions Precautions Precautions: Fall Restrictions Weight Bearing Restrictions: Yes Other Position/Activity Restrictions: per ortho consult note: WBAT     Mobility  Bed Mobility Overal bed mobility: Needs Assistance Bed Mobility: Supine to Sit  Supine to sit: Supervision  General bed mobility comments: HOB elevated,  extra time required with vcs.    Transfers Overall transfer level: Needs assistance Equipment used: Rolling walker (2 wheels) Transfers: Sit to/from Stand Sit to Stand: Min guard  General transfer comment: Pt required CGA for safety with increased time and vcs.    Ambulation/Gait Ambulation/Gait assistance: Min guard Gait Distance (Feet): 160 Feet Assistive device: Rolling walker (2 wheels) Gait Pattern/deviations: Decreased step length - right, Decreased step length - left, Narrow base of support Gait velocity: Decreased  General Gait Details: Pt was able to ambulate a lap around the hallway with CGA for safety. Poor gait quality. pt tends to ambulated with narrow BOS and toe walking today. will benefit from continued skilled PT to focu on improving gait safety.    Balance Overall balance assessment: Needs assistance Sitting-balance support: Bilateral upper extremity supported, Feet supported Sitting balance-Leahy Scale: Good     Standing balance support: Bilateral upper extremity supported, During functional activity, Reliant on assistive device for balance Standing balance-Leahy Scale: Fair     Cognition Arousal/Alertness: Awake/alert Behavior During Therapy: WFL for tasks assessed/performed Overall Cognitive Status: No family/caregiver present to determine baseline cognitive functioning      General Comments: Pt is A and properly answered orientation questions however does have some cognition concerns.               Pertinent Vitals/Pain Pain Assessment Pain Assessment: 0-10 Pain Score: 7  Pain Location: Hip on R Pain Descriptors / Indicators: Discomfort, Guarding Pain Intervention(s): Limited activity within patient's tolerance, Monitored during session, Premedicated before session, Repositioned     PT Goals (current goals can now be found in the care plan section) Acute Rehab PT Goals Patient Stated Goal: "  Get stronger and move better." Progress towards PT  goals: Progressing toward goals    Frequency    Min 1X/week      PT Plan Current plan remains appropriate    Co-evaluation     PT goals addressed during session: Mobility/safety with mobility;Balance;Proper use of DME;Strengthening/ROM        AM-PAC PT "6 Clicks" Mobility   Outcome Measure  Help needed turning from your back to your side while in a flat bed without using bedrails?: None Help needed moving from lying on your back to sitting on the side of a flat bed without using bedrails?: A Little Help needed moving to and from a bed to a chair (including a wheelchair)?: A Little Help needed standing up from a chair using your arms (e.g., wheelchair or bedside chair)?: A Little Help needed to walk in hospital room?: A Lot Help needed climbing 3-5 steps with a railing? : A Lot 6 Click Score: 17    End of Session   Activity Tolerance: Patient tolerated treatment well Patient left: in chair;with chair alarm set;with call bell/phone within reach Nurse Communication: Mobility status PT Visit Diagnosis: Unsteadiness on feet (R26.81);History of falling (Z91.81);Muscle weakness (generalized) (M62.81);Other abnormalities of gait and mobility (R26.89)     Time: 1610-9604 PT Time Calculation (min) (ACUTE ONLY): 16 min  Charges:    $Gait Training: 8-22 mins PT General Charges $$ ACUTE PT VISIT: 1 Visit                     Jetta Lout PTA 08/18/22, 1:42 PM

## 2022-08-18 NOTE — Progress Notes (Signed)
RE: Catherine Young Date of Birth: 1931-03-20 Date: 08/18/2022     To Whom It May Concern:   Please be advised that the above-named patient will require a short-term nursing home stay - anticipated 30 days or less for rehabilitation and strengthening.  The plan is for return home

## 2022-08-18 NOTE — Progress Notes (Signed)
[facility]  - [place] at [facility]   PATIENT NAME: Catherine Young    MR#:  [id number]  DATE OF BIRTH:  [birth year]  SUBJECTIVE:  patient came in after accidental fall while doing chair yoga. Lives at [facility] independent facility. No family at bedside.  A bit confused at baseline. Was able to redirect her. No new complaints. Work with therapy yesterday. No issues per RN.  VITALS:  Blood pressure (!) 155/81, pulse 61, temperature 98.1 F (36.7 C), resp. rate 16, height 5\' 4"  (1.626 m), weight 50 kg, SpO2 96%.  PHYSICAL EXAMINATION:   GENERAL:  89 y.o.-year-old patient with no acute distress. thin LUNGS: Normal breath sounds bilaterally, no wheezing CARDIOVASCULAR: S1, S2 normal. No murmur   ABDOMEN: Soft, nontender, nondistended. Bowel sounds present.  EXTREMITIES: No  edema b/l.    NEUROLOGIC: nonfocal  patient is alert and awake SKIN: No obvious rash, lesion, or ulcer.   LABORATORY PANEL:  CBC Recent Labs  Lab 08/17/22 0401  WBC 6.2  HGB 10.3*  HCT 32.3*  PLT 402*    Chemistries  Recent Labs  Lab 08/17/22 0401  NA 134*  K 3.8  CL 104  CO2 27  GLUCOSE 90  BUN 15  CREATININE 0.85  CALCIUM 8.0*  AST 16  ALT 10  ALKPHOS 66  BILITOT 0.5   Cardiac Enzymes No results for input(s): "TROPONINI" in the last 168 hours. RADIOLOGY:  No results found.  Assessment and Plan  Catherine Young is a 89 y.o. female with medical history significant of depression, hypertension, dementia presenting with fall and pelvic fracture.  Patient noted had a fall around July 13.  Was evaluated in the ER with normal imaging.  Patient reports going back to facility and having a subsequent fall approximately 5 days later after a session fo chair yoga her balance went off and fell.   CT pelvis: Highly comminuted and displaced fractures of the right pubic rami and pubic symphysis, Retzius hematoma.  Right sacral alla comminuted fracture.    Pelvic fracture (HCC),  right S/p accidental fall --Noted highly comminuted and displaced fractures of the right pubic rami and pubic symphysis with Retzius hematoma status post traumatic fall --Dr. Allena Katz with orthopedics reviewed case-nonoperable -- PT OT evaluation and pt will benefit from rehab --Pain control ---Fall precautions  Vitamin D deficiency -- will replace orally    Primary hypertension --BP stable --Titrate home regimen   Dementia (HCC) --Continue Aricept   Chronic insomnia --Continue trazodone   Family communication :son fred Kuhlmann Consults :ortho CODE STATUS: DNR DVT Prophylaxis : Level of care: Med-Surg  Patient is medically best at baseline. Awaiting TOC for discharge planning to rehab.  TOTAL TIME TAKING CARE OF THIS PATIENT: 35 minutes.  >50% time spent on counselling and coordination of care  Note: This dictation was prepared with Dragon dictation along with smaller phrase technology. Any transcriptional errors that result from this process are unintentional.  Enedina Finner M.D    [facility]   CC: Primary care physician; Excell Seltzer, MD

## 2022-08-18 NOTE — Care Management Obs Status (Addendum)
MEDICARE OBSERVATION STATUS NOTIFICATION   Patient Details  Name: Catherine Young MRN: [id number] Date of Birth: 05-Jun-1931   Medicare Observation Status Notification Given:  Yes    Garret Reddish, RN 08/18/2022, 11:50 AM

## 2022-08-19 DIAGNOSIS — F03A4 Unspecified dementia, mild, with anxiety: Secondary | ICD-10-CM | POA: Diagnosis not present

## 2022-08-19 DIAGNOSIS — S32591A Other specified fracture of right pubis, initial encounter for closed fracture: Secondary | ICD-10-CM | POA: Diagnosis not present

## 2022-08-19 DIAGNOSIS — F5104 Psychophysiologic insomnia: Secondary | ICD-10-CM | POA: Diagnosis not present

## 2022-08-19 DIAGNOSIS — I10 Essential (primary) hypertension: Secondary | ICD-10-CM | POA: Diagnosis not present

## 2022-08-19 MED ORDER — POLYETHYLENE GLYCOL 3350 17 G PO PACK
17.0000 g | PACK | Freq: Two times a day (BID) | ORAL | Status: DC
  Administered 2022-08-19 – 2022-08-23 (×5): 17 g via ORAL
  Filled 2022-08-19 (×8): qty 1

## 2022-08-19 MED ORDER — BISACODYL 5 MG PO TBEC
10.0000 mg | DELAYED_RELEASE_TABLET | Freq: Every day | ORAL | Status: DC
  Administered 2022-08-19 – 2022-08-23 (×3): 10 mg via ORAL
  Filled 2022-08-19 (×5): qty 2

## 2022-08-19 MED ORDER — HYDRALAZINE HCL 25 MG PO TABS: 25.0000 mg | ORAL_TABLET | Freq: Three times a day (TID) | ORAL | Status: DC | PRN

## 2022-08-19 MED ORDER — AMLODIPINE BESYLATE 5 MG PO TABS
5.0000 mg | ORAL_TABLET | Freq: Every day | ORAL | Status: DC
  Administered 2022-08-20 – 2022-08-23 (×4): 5 mg via ORAL
  Filled 2022-08-19 (×4): qty 1

## 2022-08-19 MED ORDER — ACETAMINOPHEN 325 MG PO TABS
650.0000 mg | ORAL_TABLET | Freq: Four times a day (QID) | ORAL | Status: DC | PRN
  Administered 2022-08-20 – 2022-08-22 (×2): 650 mg via ORAL
  Filled 2022-08-19 (×2): qty 2

## 2022-08-19 MED ORDER — AMLODIPINE BESYLATE 5 MG PO TABS
2.5000 mg | ORAL_TABLET | Freq: Once | ORAL | Status: AC
  Administered 2022-08-19: 2.5 mg via ORAL
  Filled 2022-08-19: qty 1

## 2022-08-19 NOTE — Plan of Care (Signed)
  Problem: Pain Managment: Goal: General experience of comfort will improve Outcome: Progressing   Problem: Safety: Goal: Ability to remain free from injury will improve Outcome: Progressing   Problem: Elimination: Goal: Will not experience complications related to bowel motility Outcome: Progressing   Problem: Coping: Goal: Level of anxiety will decrease Outcome: Progressing   Problem: Skin Integrity: Goal: Risk for impaired skin integrity will decrease Outcome: Progressing

## 2022-08-19 NOTE — Progress Notes (Signed)
[facility]  - [place] at [facility]   PATIENT NAME: Catherine Young    MR#:  [id number]  DATE OF BIRTH:  1931-03-01  SUBJECTIVE:  patient came in after accidental fall while doing chair yoga. Lives at [facility] independent facility. No family at bedside.  Overall improving VITALS:  Blood pressure (!) 186/75, pulse 60, temperature 98 F (36.7 C), resp. rate 16, height 5\' 4"  (1.626 m), weight 50 kg, SpO2 100%.  PHYSICAL EXAMINATION:   GENERAL:  89 y.o.-year-old patient with no acute distress. thin LUNGS: Normal breath sounds bilaterally, no wheezing CARDIOVASCULAR: S1, S2 normal. No murmur   ABDOMEN: Soft, nontender, nondistended. Bowel sounds present.  EXTREMITIES: No  edema b/l.    NEUROLOGIC: nonfocal  patient is alert and awake  LABORATORY PANEL:  CBC Recent Labs  Lab 08/17/22 0401  WBC 6.2  HGB 10.3*  HCT 32.3*  PLT 402*    Chemistries  Recent Labs  Lab 08/17/22 0401  NA 134*  K 3.8  CL 104  CO2 27  GLUCOSE 90  BUN 15  CREATININE 0.85  CALCIUM 8.0*  AST 16  ALT 10  ALKPHOS 66  BILITOT 0.5   Cardiac Enzymes No results for input(s): "TROPONINI" in the last 168 hours. RADIOLOGY:  No results found.  Assessment and Plan  Catherine Young is a 89 y.o. female with medical history significant of depression, hypertension, dementia presenting with fall and pelvic fracture.  Patient noted had a fall around July 13.  Was evaluated in the ER with normal imaging.  Patient reports going back to facility and having a subsequent fall approximately 5 days later after a session fo chair yoga her balance went off and fell.   CT pelvis: Highly comminuted and displaced fractures of the right pubic rami and pubic symphysis, Retzius hematoma.  Right sacral alla comminuted fracture.    Pelvic fracture (HCC), right S/p accidental fall --Noted highly comminuted and displaced fractures of the right pubic rami and pubic symphysis with Retzius hematoma  status post traumatic fall --Dr. Allena Katz with orthopedics reviewed case-nonoperable -- PT OT evaluation and pt will benefit from rehab --Pain control ---Fall precautions  Vitamin D deficiency -- will replace orally    Primary hypertension --BP stable --Titrate home regimen   Dementia (HCC) --Continue Aricept   Chronic insomnia --Continue trazodone   Family communication :none today Consults :ortho CODE STATUS: DNR DVT Prophylaxis : Level of care: Med-Surg  Patient is medically best at baseline. Awaiting TOC for discharge planning to rehab.  TOTAL TIME TAKING CARE OF THIS PATIENT: 35 minutes.  >50% time spent on counselling and coordination of care  Note: This dictation was prepared with Dragon dictation along with smaller phrase technology. Any transcriptional errors that result from this process are unintentional.  Enedina Finner M.D    [facility]   CC: Primary care physician; Excell Seltzer, MD

## 2022-08-19 NOTE — TOC Progression Note (Signed)
Transition of Care [facility]) - Progression Note    Patient Details  Name: Catherine Young MRN: [id number] Date of Birth: 04/22/1931  Transition of Care [facility] CM/SW Contact  Garret Reddish, RN Phone Number: 08/19/2022, 9:54 AM  Clinical Narrative:   Chart reviewed.  SNF authorization submitted.  SNF authorization pending at this time.  SNF  pending Auth ID is [id number].  I have spoken with Sue Lush and she informs me that if SNF authorization returns today she will be able to accept patient.    TOC will continue to follow for discharge planning.      Expected Discharge Plan: Skilled Nursing Facility Barriers to Discharge: No Barriers Identified  Expected Discharge Plan and Services   Discharge Planning Services: CM Consult Post Acute Care Choice: Skilled Nursing Facility Living arrangements for the past 2 months: Independent Living Facility (Patient was a resident at [facility] facility.)                                       Social Determinants of Health (SDOH) Interventions SDOH Screenings   Food Insecurity: No Food Insecurity (08/16/2022)  Housing: Low Risk  (08/16/2022)  Transportation Needs: No Transportation Needs (08/16/2022)  Utilities: Not At Risk (08/16/2022)  Alcohol Screen: Low Risk  (07/25/2022)  Depression (PHQ2-9): Low Risk  (07/25/2022)  Financial Resource Strain: Low Risk  (07/25/2022)  Physical Activity: Insufficiently Active (07/25/2022)  Social Connections: Socially Isolated (07/25/2022)  Stress: No Stress Concern Present (07/25/2022)  Tobacco Use: Medium Risk (08/16/2022)    Readmission Risk Interventions     No data to display

## 2022-08-19 NOTE — Care Management Important Message (Signed)
Important Message  Patient Details  Name: NASTASIA PLACIDO MRN: [id number] Date of Birth: 06/26/1931   Medicare Important Message Given:  Yes     Olegario Messier A Raeford Brandenburg 08/19/2022, 11:16 AM

## 2022-08-19 NOTE — Plan of Care (Signed)
  Problem: Clinical Measurements: Goal: Ability to maintain clinical measurements within normal limits will improve Outcome: Progressing Goal: Will remain free from infection Outcome: Progressing Goal: Respiratory complications will improve Outcome: Progressing Goal: Cardiovascular complication will be avoided Outcome: Progressing   Problem: Elimination: Goal: Will not experience complications related to bowel motility Outcome: Progressing   Problem: Education: Goal: Knowledge of General Education information will improve Description: Including pain rating scale, medication(s)/side effects and non-pharmacologic comfort measures Outcome: Not Progressing   Problem: Activity: Goal: Risk for activity intolerance will decrease Outcome: Not Progressing   Problem: Pain Managment: Goal: General experience of comfort will improve Outcome: Not Progressing

## 2022-08-19 NOTE — Progress Notes (Signed)
Physical Therapy Treatment Patient Details Name: Catherine Young MRN: [id number] DOB: Oct 02, 1931 Today's Date: 08/19/2022   History of Present Illness Catherine Young is a 89 y.o. female with history of osteopenia, hypertension, cataracts presents emergency department with continued right hip pain.  Patient was seen on July 13 for a fall.  CT of the head, C-spine, and pelvis were all negative for any acute abnormality. Returned to ED following additional fall and persistent pain. Repeat CT scan shows right sacral ala fracture, inferior pubic ramus fracture, and superior pubic ramus fractures.    Catherine Young Comments  Catherine Young was long sitting in bed upon arrival. She is alert but does endorse being more fatigued today. She asked several times during session if she will be returning to [facility]. Per chart, insurance auth still pending. Catherine Young continues to be cooperative and motivated but limited by pain and fatigue. She present with inconsistent gait. Continues to have antalgic, narrow, toe walking gait pattern but with Vcs does improve slightly.Poor carryover between gait trials (2 separate times this session) and sometimes with too much cueing, gait quality becomes worse. Catherine Young will greatly benefit from continued skilled Catherine Young at DC to maximize independence and safety with all ADLs.     Assistance Recommended at Discharge Intermittent Supervision/Assistance  If plan is discharge home, recommend the following:  Can travel by private vehicle    A little help with walking and/or transfers;A little help with bathing/dressing/bathroom;Assistance with cooking/housework;Assist for transportation;Help with stairs or ramp for entrance   Yes  Equipment Recommendations  Other (comment) (defer to next level of care)       Precautions / Restrictions Precautions Precautions: Fall Restrictions Weight Bearing Restrictions: Yes Other Position/Activity Restrictions: per ortho consult note: WBAT     Mobility  Bed  Mobility Overal bed mobility: Needs Assistance Bed Mobility: Supine to Sit, Sit to Supine  Supine to sit: Supervision Sit to supine: Supervision   Transfers Overall transfer level: Needs assistance Equipment used: Rolling walker (2 wheels) Transfers: Sit to/from Stand Sit to Stand: Min guard    General transfer comment: CGA for safety with Vcs for technique and sequencing improvements. poor carryover between yesterdays session and todays.    Ambulation/Gait Ambulation/Gait assistance: Min guard Gait Distance (Feet): 50 Feet Assistive device: Rolling walker (2 wheels) Gait Pattern/deviations: Step-to pattern, Step-through pattern Gait velocity: decreased  General Gait Details: Catherine Young continues to present with poor overall gait quality. Needs constant vcs for posture correction, heel strike, and widen BOS.    Balance Overall balance assessment: Needs assistance Sitting-balance support: Feet supported Sitting balance-Leahy Scale: Good     Standing balance support: Bilateral upper extremity supported, During functional activity, Reliant on assistive device for balance Standing balance-Leahy Scale: Fair Standing balance comment: No LOB with RW in place       Cognition Arousal/Alertness: Awake/alert Behavior During Therapy: WFL for tasks assessed/performed   Area of Impairment: Attention, Memory, Following commands, Safety/judgement, Awareness, Problem solving    Memory: Decreased recall of precautions, Decreased short-term memory Following Commands: Follows one step commands consistently, Follows one step commands with increased time Safety/Judgement: Decreased awareness of safety, Decreased awareness of deficits   Problem Solving: Slow processing, Decreased initiation, Difficulty sequencing, Requires verbal cues, Requires tactile cues General Comments: Catherine Young is pleasant, cooperative, mildly impulsive               Pertinent Vitals/Pain Pain Assessment Pain Assessment:  0-10 Pain Score: 3  Pain Descriptors / Indicators: Discomfort, Guarding Pain Intervention(s): Limited activity  within patient's tolerance, Monitored during session, Premedicated before session, Repositioned     Catherine Young Goals (current goals can now be found in the care plan section) Acute Rehab Catherine Young Goals Patient Stated Goal: go back to [facility] Progress towards Catherine Young goals: Progressing toward goals    Frequency    Min 1X/week      Catherine Young Plan Current plan remains appropriate    Co-evaluation     Catherine Young goals addressed during session: Mobility/safety with mobility;Balance;Proper use of DME;Strengthening/ROM        AM-PAC Catherine Young "6 Clicks" Mobility   Outcome Measure  Help needed turning from your back to your side while in a flat bed without using bedrails?: None Help needed moving from lying on your back to sitting on the side of a flat bed without using bedrails?: A Little Help needed moving to and from a bed to a chair (including a wheelchair)?: A Little Help needed standing up from a chair using your arms (e.g., wheelchair or bedside chair)?: A Little Help needed to walk in hospital room?: A Little Help needed climbing 3-5 steps with a railing? : A Lot 6 Click Score: 18    End of Session Equipment Utilized During Treatment: Gait belt Activity Tolerance: Patient tolerated treatment well Patient left: in bed;with call bell/phone within reach;with bed alarm set (Catherine Young unwilling to sit up in recliner today due to increased fatigue) Nurse Communication: Mobility status Catherine Young Visit Diagnosis: Unsteadiness on feet (R26.81);History of falling (Z91.81);Muscle weakness (generalized) (M62.81);Other abnormalities of gait and mobility (R26.89)     Time: 1009-1030 Catherine Young Time Calculation (min) (ACUTE ONLY): 21 min  Charges:    $Gait Training: 8-22 mins Catherine Young General Charges $$ ACUTE Catherine Young VISIT: 1 Visit                    Jetta Lout PTA 08/19/22, 4:47 PM

## 2022-08-19 NOTE — Progress Notes (Signed)
Occupational Therapy Treatment Patient Details Name: Catherine Young MRN: [id number] DOB: Jul 12, 1931 Today's Date: 08/19/2022   History of present illness Catherine Young is a 89 y.o. female with history of osteopenia, hypertension, cataracts presents emergency department with continued right hip pain.  Patient was seen on July 13 for a fall.  CT of the head, C-spine, and pelvis were all negative for any acute abnormality. Returned to ED following additional fall and persistent pain. Repeat CT scan shows right sacral ala fracture, inferior pubic ramus fracture, and superior pubic ramus fractures.   OT comments  Pt seen for OT treatment on this date. Upon arrival to room pt resting in bed, agreeable to tx with encouragement and a plan in place. Pt requires supervision for bed mobility and CGA for ambulation with RW in place. Pt was performed sponge bath while seated on 3-in-1. Pt required set-up A and VC for completion on bathing task. MIN A for donning briefs sit>stand. Pt ambulated 168ft with RW. Pt tolerated session well, reporting minimal pain throughout session. Pt making good progress toward goals, will continue to follow POC. Discharge recommendation remains appropriate.     Recommendations for follow up therapy are one component of a multi-disciplinary discharge planning process, led by the attending physician.  Recommendations may be updated based on patient status, additional functional criteria and insurance authorization.    Assistance Recommended at Discharge Intermittent Supervision/Assistance  Patient can return home with the following  A little help with walking and/or transfers;Assistance with cooking/housework;Direct supervision/assist for medications management;Direct supervision/assist for financial management;Assist for transportation;Help with stairs or ramp for entrance;A little help with bathing/dressing/bathroom   Equipment Recommendations  Other (comment) (defer to  next venue of care)    Recommendations for Other Services      Precautions / Restrictions Precautions Precautions: Fall Restrictions Weight Bearing Restrictions: Yes Other Position/Activity Restrictions: per ortho consult note: WBAT       Mobility Bed Mobility Overal bed mobility: Needs Assistance Bed Mobility: Sit to Supine     Supine to sit: Supervision          Transfers Overall transfer level: Needs assistance Equipment used: Rolling walker (2 wheels) Transfers: Sit to/from Stand Sit to Stand: Min guard     Step pivot transfers: Min guard     General transfer comment: Pt required CGA for safety with increased time and vcs.     Balance Overall balance assessment: Needs assistance Sitting-balance support: Feet supported Sitting balance-Leahy Scale: Good Sitting balance - Comments: Performed bathing tasks seated on 3-in-1.   Standing balance support: Bilateral upper extremity supported, During functional activity, Reliant on assistive device for balance Standing balance-Leahy Scale: Fair Standing balance comment: No LOB with RW in palce                           ADL either performed or assessed with clinical judgement   ADL Overall ADL's : Needs assistance/impaired     Grooming: Oral care;Set up;Sitting   Upper Body Bathing: Supervision/ safety;Sitting   Lower Body Bathing: Minimal assistance;Sit to/from stand   Upper Body Dressing : Min guard;Sitting   Lower Body Dressing: Minimal assistance;Sit to/from stand Lower Body Dressing Details (indicate cue type and reason): threading underwear, then STS with intermittent vcs for sequencing; sitting/lateral leans for donning socks with MIN A, no vcs for sequencing Toilet Transfer: Min guard;Rolling walker (2 wheels);Ambulation;Cueing for safety;Cueing for sequencing   Toileting- Clothing Manipulation and Hygiene: Supervision/safety;Sitting/lateral lean  Functional mobility during ADLs:  Rolling walker (2 wheels);Min guard;Cueing for safety;Cueing for sequencing General ADL Comments: Supervision - MIN A for ADL completion appeared to be related to unfamiliar environment vs pain    Extremity/Trunk Assessment Upper Extremity Assessment Upper Extremity Assessment: Overall WFL for tasks assessed   Lower Extremity Assessment Lower Extremity Assessment: Overall WFL for tasks assessed        Vision       Perception     Praxis      Cognition Arousal/Alertness: Awake/alert Behavior During Therapy: WFL for tasks assessed/performed Overall Cognitive Status: No family/caregiver present to determine baseline cognitive functioning Area of Impairment: Attention, Memory, Following commands, Safety/judgement, Awareness, Problem solving                   Current Attention Level: Sustained Memory: Decreased short-term memory Following Commands: Follows one step commands consistently, Follows one step commands with increased time Safety/Judgement: Decreased awareness of safety Awareness: Emergent Problem Solving: Requires verbal cues General Comments: pt is pleasant, cooperative, mildly impulsive        Exercises      Shoulder Instructions       General Comments      Pertinent Vitals/ Pain       Pain Assessment Pain Assessment: Faces Faces Pain Scale: Hurts a little bit Pain Location: with mobility Pain Descriptors / Indicators: Discomfort, Guarding Pain Intervention(s): Limited activity within patient's tolerance  Home Living                                          Prior Functioning/Environment              Frequency  Min 1X/week        Progress Toward Goals  OT Goals(current goals can now be found in the care plan section)  Progress towards OT goals: Progressing toward goals  Acute Rehab OT Goals Patient Stated Goal: To feel better OT Goal Formulation: With patient Time For Goal Achievement: 08/30/22 Potential to  Achieve Goals: Good ADL Goals Pt Will Perform Grooming: with modified independence;standing Pt Will Perform Lower Body Dressing: with modified independence;sit to/from stand Pt Will Transfer to Toilet: with modified independence;bedside commode;ambulating Pt Will Perform Toileting - Clothing Manipulation and hygiene: with modified independence;sit to/from stand  Plan Discharge plan remains appropriate    Co-evaluation                 AM-PAC OT "6 Clicks" Daily Activity     Outcome Measure   Help from another person eating meals?: None Help from another person taking care of personal grooming?: A Little Help from another person toileting, which includes using toliet, bedpan, or urinal?: A Little Help from another person bathing (including washing, rinsing, drying)?: A Little Help from another person to put on and taking off regular upper body clothing?: None Help from another person to put on and taking off regular lower body clothing?: A Little 6 Click Score: 20    End of Session Equipment Utilized During Treatment: Rolling walker (2 wheels)  OT Visit Diagnosis: Unsteadiness on feet (R26.81);Repeated falls (R29.6)   Activity Tolerance Patient tolerated treatment well   Patient Left with call bell/phone within reach;in chair;with chair alarm set   Nurse Communication          Time: [phone] OT Time Calculation (min): 44 min  Charges: OT General Charges $OT Visit:  1 Visit OT Treatments $Self Care/Home Management : 38-52 mins Thresa Ross, OTS 08/19/2022, 12:50 PM

## 2022-08-19 NOTE — TOC Progression Note (Signed)
Transition of Care [facility]) - Progression Note    Patient Details  Name: Catherine Young MRN: [id number] Date of Birth: [birth year]  Transition of Care [facility]) CM/SW Contact  Garret Reddish, RN Phone Number: 08/19/2022, 2:34 PM  Clinical Narrative:   SNF authorization still pending.  I have spoken with Sue Lush with [facility].  She informs me that she is able to accept patient over the weekend.  The weekend team will need to call Sue Lush and make her aware if authorization is approved.  TOC will continue to follow for discharge planning.       Expected Discharge Plan: Skilled Nursing Facility Barriers to Discharge: No Barriers Identified  Expected Discharge Plan and Services   Discharge Planning Services: CM Consult Post Acute Care Choice: Skilled Nursing Facility Living arrangements for the past 2 months: Independent Living Facility (Patient was a resident at [facility] Independent Living facility.)                                       Social Determinants of Health (SDOH) Interventions SDOH Screenings   Food Insecurity: No Food Insecurity (08/16/2022)  Housing: Low Risk  (08/16/2022)  Transportation Needs: No Transportation Needs (08/16/2022)  Utilities: Not At Risk (08/16/2022)  Alcohol Screen: Low Risk  (07/25/2022)  Depression (PHQ2-9): Low Risk  (07/25/2022)  Financial Resource Strain: Low Risk  (07/25/2022)  Physical Activity: Insufficiently Active (07/25/2022)  Social Connections: Socially Isolated (07/25/2022)  Stress: No Stress Concern Present (07/25/2022)  Tobacco Use: Medium Risk (08/16/2022)    Readmission Risk Interventions     No data to display

## 2022-08-19 NOTE — Progress Notes (Addendum)
The patient is confused and keep on getting up off bed to go to her closet and get things clean up. Patient re-oriented each time. Bed alarm on. Will continue to monitor.

## 2022-08-20 DIAGNOSIS — F03A4 Unspecified dementia, mild, with anxiety: Secondary | ICD-10-CM | POA: Diagnosis not present

## 2022-08-20 DIAGNOSIS — S32591A Other specified fracture of right pubis, initial encounter for closed fracture: Secondary | ICD-10-CM | POA: Diagnosis not present

## 2022-08-20 DIAGNOSIS — F5104 Psychophysiologic insomnia: Secondary | ICD-10-CM | POA: Diagnosis not present

## 2022-08-20 DIAGNOSIS — I10 Essential (primary) hypertension: Secondary | ICD-10-CM | POA: Diagnosis not present

## 2022-08-20 NOTE — TOC Progression Note (Signed)
Transition of Care [facility]) - Progression Note    Patient Details  Name: Catherine Young MRN: [id number] Date of Birth: 22-Oct-1931  Transition of Care [facility]) CM/SW Contact  Kemper Durie, RN Phone Number: 08/20/2022, 9:24 AM  Clinical Narrative:     [facility] remains pending.  Expected Discharge Plan: Skilled Nursing Facility Barriers to Discharge: No Barriers Identified  Expected Discharge Plan and Services   Discharge Planning Services: CM Consult Post Acute Care Choice: Skilled Nursing Facility Living arrangements for the past 2 months: Independent Living Facility (Patient was a resident at [facility] Independent Living facility.)                                       Social Determinants of Health (SDOH) Interventions SDOH Screenings   Food Insecurity: No Food Insecurity (08/16/2022)  Housing: Low Risk  (08/16/2022)  Transportation Needs: No Transportation Needs (08/16/2022)  Utilities: Not At Risk (08/16/2022)  Alcohol Screen: Low Risk  (07/25/2022)  Depression (PHQ2-9): Low Risk  (07/25/2022)  Financial Resource Strain: Low Risk  (07/25/2022)  Physical Activity: Insufficiently Active (07/25/2022)  Social Connections: Socially Isolated (07/25/2022)  Stress: No Stress Concern Present (07/25/2022)  Tobacco Use: Medium Risk (08/16/2022)    Readmission Risk Interventions     No data to display

## 2022-08-20 NOTE — Progress Notes (Signed)
Physical Therapy Treatment Patient Details Name: Catherine Young MRN: [id number] DOB: [birth year] Today's Date: 08/20/2022   History of Present Illness Catherine Young is a 89 y.o. female with history of osteopenia, hypertension, cataracts presents emergency department with continued right hip pain.  Patient was seen on July 13 for a fall.  CT of the head, C-spine, and pelvis were all negative for any acute abnormality. Returned to ED following additional fall and persistent pain. Repeat CT scan shows right sacral ala fracture, inferior pubic ramus fracture, and superior pubic ramus fractures.    PT Comments  Pt was sitting in recliner upon arrival. " Im waiting for my lunch to get here." Endorses pain but was willing to participate. She was able to stand to RW and started to ambulate into the hallway when she request to go to BR to urinate. After successfully urinating on toilet she stood and started to ambulate back towards hallway when RN (to give medications) and lunch tray arrived. Pt returned to recliner. Session greatly limited due to timing. Acute PT will continue to follow and progress per current POC. Pt would greatly benefit form continued skilled PT to assist per back to PLOF and independence with ADLs.      Assistance Recommended at Discharge Frequent or constant Supervision/Assistance  If plan is discharge home, recommend the following:  Can travel by private vehicle    A little help with walking and/or transfers;A little help with bathing/dressing/bathroom;Assistance with cooking/housework;Assist for transportation;Help with stairs or ramp for entrance      Equipment Recommendations  Other (comment);Rolling walker (2 wheels);BSC/3in1       Precautions / Restrictions Precautions Precautions: Fall Restrictions Weight Bearing Restrictions: Yes Other Position/Activity Restrictions: per ortho consult note: WBAT     Mobility  Bed Mobility  General bed mobility comments:  In recliner pre/post session    Transfers Overall transfer level: Needs assistance Equipment used: Rolling walker (2 wheels) Transfers: Sit to/from Stand Sit to Stand: Min guard  General transfer comment: CGA for safety with vcs for handplacement and technique improvements    Ambulation/Gait Ambulation/Gait assistance: Min guard Gait Distance (Feet): 25 Feet Assistive device: Rolling walker (2 wheels) Gait Pattern/deviations: Step-through pattern, Trunk flexed, Narrow base of support Gait velocity: decreased  General Gait Details: Pt was able to ambulate to BR and then back. Distance limited by lunch tray and RN entering room to give medications    Balance Overall balance assessment: Needs assistance Sitting-balance support: Feet supported Sitting balance-Leahy Scale: Good     Standing balance support: Bilateral upper extremity supported, During functional activity, Reliant on assistive device for balance Standing balance-Leahy Scale: Fair Standing balance comment: pt remains high fall risk due to safety awareness concerns       Cognition Arousal/Alertness: Awake/alert Behavior During Therapy: WFL for tasks assessed/performed Overall Cognitive Status: No family/caregiver present to determine baseline cognitive functioning Area of Impairment: Attention, Memory, Following commands, Safety/judgement, Awareness, Problem solving     Memory: Decreased short-term memory Following Commands: Follows one step commands consistently, Follows one step commands with increased time, Follows multi-step commands inconsistently Safety/Judgement: Decreased awareness of deficits Awareness: Emergent Problem Solving: Slow processing, Decreased initiation, Difficulty sequencing, Requires verbal cues, Requires tactile cues General Comments: pt is pleasant, cooperative, mildly impulsive               Pertinent Vitals/Pain Pain Assessment Pain Assessment: 0-10 Pain Score: 4  Pain Location:  RLE/pelvis Pain Descriptors / Indicators: Discomfort, Guarding Pain Intervention(s): Monitored during session,  Limited activity within patient's tolerance, RN gave pain meds during session, Repositioned     PT Goals (current goals can now be found in the care plan section) Acute Rehab PT Goals Patient Stated Goal: go back to [place] Progress towards PT goals: Progressing toward goals    Frequency    Min 1X/week      PT Plan Current plan remains appropriate    Co-evaluation     PT goals addressed during session: Mobility/safety with mobility        AM-PAC PT "6 Clicks" Mobility   Outcome Measure  Help needed turning from your back to your side while in a flat bed without using bedrails?: None Help needed moving from lying on your back to sitting on the side of a flat bed without using bedrails?: A Little Help needed moving to and from a bed to a chair (including a wheelchair)?: A Little Help needed standing up from a chair using your arms (e.g., wheelchair or bedside chair)?: A Little Help needed to walk in hospital room?: A Little Help needed climbing 3-5 steps with a railing? : A Little 6 Click Score: 19    End of Session   Activity Tolerance: Patient tolerated treatment well Patient left: in chair;with call bell/phone within reach;with nursing/sitter in room Nurse Communication: Mobility status PT Visit Diagnosis: Unsteadiness on feet (R26.81);History of falling (Z91.81);Muscle weakness (generalized) (M62.81);Other abnormalities of gait and mobility (R26.89)     Time: 3244-0102 PT Time Calculation (min) (ACUTE ONLY): 13 min  Charges:    $Gait Training: 8-22 mins PT General Charges $$ ACUTE PT VISIT: 1 Visit                    Jetta Lout PTA 08/20/22, 4:14 PM

## 2022-08-20 NOTE — Progress Notes (Signed)
[facility]  - [place] [facility]   PATIENT NAME: Catherine Young    MR#:  [id number]  DATE OF BIRTH:  02/15/1931  SUBJECTIVE:  patient came in after accidental fall while doing chair yoga. Lives at [facility] independent facility. No family at bedside.  Overall improving VITALS:  Blood pressure (!) 145/59, pulse (!) 52, temperature (!) 97.5 F (36.4 C), resp. rate 15, height 5\' 4"  (1.626 m), weight 50 kg, SpO2 96%.  PHYSICAL EXAMINATION:   GENERAL:  89 y.o.-year-old patient with no acute distress. thin LUNGS: Normal breath sounds bilaterally, no wheezing CARDIOVASCULAR: S1, S2 normal. No murmur   ABDOMEN: Soft, nontender, nondistended. Bowel sounds present.  EXTREMITIES: No  edema b/l.    NEUROLOGIC: nonfocal  patient is alert and awake  LABORATORY PANEL:  CBC Recent Labs  Lab 08/17/22 0401  WBC 6.2  HGB 10.3*  HCT 32.3*  PLT 402*    Chemistries  Recent Labs  Lab 08/17/22 0401  NA 134*  K 3.8  CL 104  CO2 27  GLUCOSE 90  BUN 15  CREATININE 0.85  CALCIUM 8.0*  AST 16  ALT 10  ALKPHOS 66  BILITOT 0.5   Cardiac Enzymes No results for input(s): "TROPONINI" in the last 168 hours. RADIOLOGY:  No results found.  Assessment and Plan  Catherine Young is a 89 y.o. female with medical history significant of depression, hypertension, dementia presenting with fall and pelvic fracture.  Patient noted had a fall around July 13.  Was evaluated in the ER with normal imaging.  Patient reports going back to facility and having a subsequent fall approximately 5 days later after a session fo chair yoga her balance went off and fell.   CT pelvis: Highly comminuted and displaced fractures of the right pubic rami and pubic symphysis, Retzius hematoma.  Right sacral alla comminuted fracture.    Pelvic fracture (HCC), right S/p accidental fall --Noted highly comminuted and displaced fractures of the right pubic rami and pubic symphysis with Retzius  hematoma status post traumatic fall --Dr. Allena Katz with orthopedics reviewed case-nonoperable -- PT OT evaluation and pt will benefit from rehab --Pain control ---Fall precautions  Vitamin D deficiency -- will replace orally    Primary hypertension --BP stable --Titrate home regimen   Dementia (HCC) --Continue Aricept   Chronic insomnia --Continue trazodone   Family communication :unable to leave VM for son Consults :ortho CODE STATUS: DNR DVT Prophylaxis : Level of care: Med-Surg  Patient is medically best at baseline. Awaiting TOC for discharge planning to rehab.  TOTAL TIME TAKING CARE OF THIS PATIENT: 35 minutes.  >50% time spent on counselling and coordination of care  Note: This dictation was prepared with Dragon dictation along with smaller phrase technology. Any transcriptional errors that result from this process are unintentional.  Enedina Finner M.D    [facility]   CC: Primary care physician; Excell Seltzer, MD

## 2022-08-20 NOTE — Plan of Care (Signed)

## 2022-08-20 NOTE — Plan of Care (Signed)
  Problem: Education: Goal: Knowledge of General Education information will improve Description: Including pain rating scale, medication(s)/side effects and non-pharmacologic comfort measures Outcome: Progressing   Problem: Clinical Measurements: Goal: Ability to maintain clinical measurements within normal limits will improve Outcome: Progressing Goal: Will remain free from infection Outcome: Progressing Goal: Respiratory complications will improve Outcome: Progressing Goal: Cardiovascular complication will be avoided Outcome: Progressing   Problem: Elimination: Goal: Will not experience complications related to bowel motility Outcome: Progressing   Problem: Pain Managment: Goal: General experience of comfort will improve Outcome: Progressing

## 2022-08-21 DIAGNOSIS — I10 Essential (primary) hypertension: Secondary | ICD-10-CM | POA: Diagnosis not present

## 2022-08-21 DIAGNOSIS — F03A4 Unspecified dementia, mild, with anxiety: Secondary | ICD-10-CM | POA: Diagnosis not present

## 2022-08-21 DIAGNOSIS — F5104 Psychophysiologic insomnia: Secondary | ICD-10-CM | POA: Diagnosis not present

## 2022-08-21 DIAGNOSIS — S32591A Other specified fracture of right pubis, initial encounter for closed fracture: Secondary | ICD-10-CM | POA: Diagnosis not present

## 2022-08-21 NOTE — Plan of Care (Signed)

## 2022-08-21 NOTE — Progress Notes (Signed)
Mobility Specialist - Progress Note     08/21/22 1551  Mobility  Activity Ambulated with assistance in hallway;Ambulated with assistance to bathroom  Level of Assistance Contact guard assist, steadying assist  Assistive Device Front wheel walker  Distance Ambulated (ft) 180 ft  Range of Motion/Exercises Active  Activity Response Tolerated well  Mobility Referral Yes  $Mobility charge 1 Mobility  Mobility Specialist Start Time (ACUTE ONLY) 1525  Mobility Specialist Stop Time (ACUTE ONLY) 1551  Mobility Specialist Time Calculation (min) (ACUTE ONLY) 26 min   Pt resting in bathroom upon entry (NT present in room). Pt STS and ambulates to hallway around NS for 1 lap with RW CGA. Pt gait was mildly unstable due to angled steps. Pt given vocal cues to try to keep walker alined with steps and keep it close to prevent falls. Pt returned to recliner and left with needs in reach; chair alarm activated.   Catherine Young Mobility Specialist 08/21/22, 3:53 PM

## 2022-08-21 NOTE — Progress Notes (Signed)
[facility]  - [place] at [facility]   PATIENT NAME: Catherine Young    MR#:  [id number]  DATE OF BIRTH:  1931-07-16  SUBJECTIVE:  patient came in after accidental fall while doing chair yoga. Lives at [facility] independent facility. No family at bedside.  Overall improving VITALS:  Blood pressure (!) 146/80, pulse (!) 56, temperature 98.2 F (36.8 C), resp. rate 18, height 5\' 4"  (1.626 m), weight 50 kg, SpO2 94%.  PHYSICAL EXAMINATION:   GENERAL:  89 y.o.-year-old patient with no acute distress. thin LUNGS: Normal breath sounds bilaterally, no wheezing CARDIOVASCULAR: S1, S2 normal. No murmur   ABDOMEN: Soft, nontender, nondistended. Bowel sounds present.  EXTREMITIES: No  edema b/l.    NEUROLOGIC: nonfocal  patient is alert and awake  LABORATORY PANEL:  CBC Recent Labs  Lab 08/17/22 0401  WBC 6.2  HGB 10.3*  HCT 32.3*  PLT 402*    Chemistries  Recent Labs  Lab 08/17/22 0401  NA 134*  K 3.8  CL 104  CO2 27  GLUCOSE 90  BUN 15  CREATININE 0.85  CALCIUM 8.0*  AST 16  ALT 10  ALKPHOS 66  BILITOT 0.5   Cardiac Enzymes No results for input(s): "TROPONINI" in the last 168 hours. RADIOLOGY:  No results found.  Assessment and Plan  Catherine Young is a 89 y.o. female with medical history significant of depression, hypertension, dementia presenting with fall and pelvic fracture.  Patient noted had a fall around July 13.  Was evaluated in the ER with normal imaging.  Patient reports going back to facility and having a subsequent fall approximately 5 days later after a session fo chair yoga her balance went off and fell.   CT pelvis: Highly comminuted and displaced fractures of the right pubic rami and pubic symphysis, Retzius hematoma.  Right sacral alla comminuted fracture.    Pelvic fracture (HCC), right S/p accidental fall --Noted highly comminuted and displaced fractures of the right pubic rami and pubic symphysis with Retzius hematoma  status post traumatic fall --Dr. Allena Katz with orthopedics reviewed case-nonoperable -- PT OT evaluation and pt will benefit from rehab --Pain control ---Fall precautions  Vitamin D deficiency -- will replace orally    Primary hypertension --BP stable --Titrate home regimen   Dementia (HCC) --Continue Aricept   Chronic insomnia --Continue trazodone   Family communication :unable to leave VM for son Consults :ortho CODE STATUS: DNR DVT Prophylaxis : Level of care: Med-Surg  Patient is medically best at baseline. Awaiting TOC for discharge planning to rehab.  TOTAL TIME TAKING CARE OF THIS PATIENT: 35 minutes.  >50% time spent on counselling and coordination of care  Note: This dictation was prepared with Dragon dictation along with smaller phrase technology. Any transcriptional errors that result from this process are unintentional.  Enedina Finner M.D    [facility]   CC: Primary care physician; Excell Seltzer, MD

## 2022-08-22 DIAGNOSIS — S32591A Other specified fracture of right pubis, initial encounter for closed fracture: Secondary | ICD-10-CM | POA: Diagnosis not present

## 2022-08-22 DIAGNOSIS — I10 Essential (primary) hypertension: Secondary | ICD-10-CM | POA: Diagnosis not present

## 2022-08-22 DIAGNOSIS — F03A4 Unspecified dementia, mild, with anxiety: Secondary | ICD-10-CM | POA: Diagnosis not present

## 2022-08-22 DIAGNOSIS — F5104 Psychophysiologic insomnia: Secondary | ICD-10-CM | POA: Diagnosis not present

## 2022-08-22 MED ORDER — OXYCODONE-ACETAMINOPHEN 7.5-325 MG PO TABS: 1.0000 | ORAL_TABLET | Freq: Three times a day (TID) | ORAL | 0 refills | Status: DC | PRN

## 2022-08-22 MED ORDER — AMLODIPINE BESYLATE 5 MG PO TABS: 5.0000 mg | ORAL_TABLET | Freq: Every day | ORAL | 0 refills | Status: AC

## 2022-08-22 MED ORDER — VITAMIN D3 25 MCG PO TABS: 1000.0000 [IU] | ORAL_TABLET | Freq: Every day | ORAL | 0 refills | Status: DC

## 2022-08-22 MED ORDER — POLYETHYLENE GLYCOL 3350 17 G PO PACK: 17.0000 g | PACK | Freq: Every day | ORAL | 0 refills | Status: DC | PRN

## 2022-08-22 MED ORDER — CLONAZEPAM 1 MG PO TABS: ORAL_TABLET | ORAL | 0 refills | Status: DC

## 2022-08-22 NOTE — Plan of Care (Signed)
  Problem: Education: Goal: Knowledge of General Education information will improve Description Including pain rating scale, medication(s)/side effects and non-pharmacologic comfort measures Outcome: Progressing   Problem: Health Behavior/Discharge Planning: Goal: Ability to manage health-related needs will improve Outcome: Progressing   Problem: Clinical Measurements: Goal: Ability to maintain clinical measurements within normal limits will improve Outcome: Progressing Goal: Will remain free from infection Outcome: Progressing   Problem: Activity: Goal: Risk for activity intolerance will decrease Outcome: Progressing   Problem: Nutrition: Goal: Adequate nutrition will be maintained Outcome: Progressing   Problem: Coping: Goal: Level of anxiety will decrease Outcome: Progressing   Problem: Elimination: Goal: Will not experience complications related to bowel motility Outcome: Progressing   Problem: Pain Managment: Goal: General experience of comfort will improve Outcome: Progressing   Problem: Safety: Goal: Ability to remain free from injury will improve Outcome: Progressing   Problem: Skin Integrity: Goal: Risk for impaired skin integrity will decrease Outcome: Progressing   

## 2022-08-22 NOTE — Progress Notes (Signed)
Physical Therapy Treatment Patient Details Name: Catherine Young MRN: [id number] DOB: [birth year] Today's Date: 08/22/2022   History of Present Illness Catherine Young is a 89 y.o. female with history of osteopenia, hypertension, cataracts presents emergency department with continued right hip pain.  Patient was seen on July 13 for a fall.  CT of the head, C-spine, and pelvis were all negative for any acute abnormality. Returned to ED following additional fall and persistent pain. Repeat CT scan shows right sacral ala fracture, inferior pubic ramus fracture, and superior pubic ramus fractures.    PT Comments  Pt resting in bed upon PT arrival; agreeable to therapy.  During session pt modified independent with bed mobility; SBA to CGA with transfers; and CGA to ambulate 120 feet with RW use.  Pt reporting increased R pelvic pain after walking this morning so ambulation distance modified to attempt to minimize increase in pain post ambulation.   R hip pain 3/10 at rest beginning of session but 6/10 end of session at rest (nurse notified of pt's request for pain meds).  Will continue to focus on strengthening, balance, and progressive functional mobility per pt tolerance.   If plan is discharge home, recommend the following: A little help with walking and/or transfers;A little help with bathing/dressing/bathroom;Assistance with cooking/housework;Assist for transportation;Help with stairs or ramp for entrance   Can travel by private vehicle     Yes  Equipment Recommendations  Other (comment);Rolling walker (2 wheels);BSC/3in1    Recommendations for Other Services       Precautions / Restrictions Precautions Precautions: Fall Restrictions Weight Bearing Restrictions: Yes Other Position/Activity Restrictions: per ortho consult note: WBAT     Mobility  Bed Mobility Overal bed mobility: Needs Assistance Bed Mobility: Supine to Sit, Sit to Supine     Supine to sit: Modified independent  (Device/Increase time) Sit to supine: Modified independent (Device/Increase time)   General bed mobility comments: Increased effort to perform on own    Transfers Overall transfer level: Needs assistance Equipment used: Rolling walker (2 wheels) Transfers: Sit to/from Stand Sit to Stand: Supervision, Min guard           General transfer comment: SBA to CGA for safety; vc's for UE placement/technique; x6 trials from bed    Ambulation/Gait Ambulation/Gait assistance: Min guard Gait Distance (Feet): 120 Feet Assistive device: Rolling walker (2 wheels) Gait Pattern/deviations: Step-through pattern, Trunk flexed, Narrow base of support Gait velocity: decreased     General Gait Details: decreased stance time R LE   Stairs             Wheelchair Mobility     Tilt Bed    Modified Rankin (Stroke Patients Only)       Balance Overall balance assessment: Needs assistance Sitting-balance support: No upper extremity supported, Feet supported Sitting balance-Leahy Scale: Good Sitting balance - Comments: steady reaching within BOS   Standing balance support: Bilateral upper extremity supported, During functional activity, Reliant on assistive device for balance Standing balance-Leahy Scale: Good Standing balance comment: assist for safety d/t impaired safety awareness                            Cognition Arousal/Alertness: Awake/alert Behavior During Therapy: WFL for tasks assessed/performed Overall Cognitive Status: No family/caregiver present to determine baseline cognitive functioning Area of Impairment: Safety/judgement, Awareness, Problem solving, Memory, Following commands  Memory: Decreased short-term memory Following Commands: Follows one step commands consistently, Follows one step commands with increased time, Follows multi-step commands inconsistently Safety/Judgement: Decreased awareness of deficits Awareness:  Emergent Problem Solving: Requires verbal cues, Requires tactile cues, Slow processing, Difficulty sequencing          Exercises General Exercises - Lower Extremity Long Arc Quad: AROM, Strengthening, Both, 10 reps, Seated Hip Flexion/Marching: AROM, Strengthening, Both, 10 reps, Seated    General Comments General comments (skin integrity, edema, etc.): pt continues to be a high falls risk due to cognition/unfamiliar environment.  Nursing cleared pt for participation in physical therapy.  Pt agreeable to PT session.      Pertinent Vitals/Pain Pain Assessment Pain Assessment: 0-10 Pain Score: 6  Pain Location: R pelvic pain Pain Descriptors / Indicators: Discomfort, Guarding, Aching Pain Intervention(s): Limited activity within patient's tolerance, Monitored during session, Repositioned, Patient requesting pain meds-RN notified Vitals (HR and SpO2 on room air) stable and WFL throughout treatment session.    Home Living                          Prior Function            PT Goals (current goals can now be found in the care plan section) Acute Rehab PT Goals Patient Stated Goal: go back to [place] PT Goal Formulation: With patient Time For Goal Achievement: 08/30/22 Potential to Achieve Goals: Good Progress towards PT goals: Progressing toward goals    Frequency    Min 1X/week      PT Plan Current plan remains appropriate    Co-evaluation              AM-PAC PT "6 Clicks" Mobility   Outcome Measure  Help needed turning from your back to your side while in a flat bed without using bedrails?: None Help needed moving from lying on your back to sitting on the side of a flat bed without using bedrails?: A Little Help needed moving to and from a bed to a chair (including a wheelchair)?: A Little Help needed standing up from a chair using your arms (e.g., wheelchair or bedside chair)?: A Little Help needed to walk in hospital room?: A Little Help  needed climbing 3-5 steps with a railing? : A Little 6 Click Score: 19    End of Session Equipment Utilized During Treatment: Gait belt Activity Tolerance: Patient tolerated treatment well Patient left: in bed;with call bell/phone within reach;with bed alarm set Nurse Communication: Mobility status;Precautions;Patient requests pain meds PT Visit Diagnosis: Unsteadiness on feet (R26.81);History of falling (Z91.81);Muscle weakness (generalized) (M62.81);Other abnormalities of gait and mobility (R26.89)     Time: 5784-6962 PT Time Calculation (min) (ACUTE ONLY): 14 min  Charges:    $Therapeutic Activity: 8-22 mins PT General Charges $$ ACUTE PT VISIT: 1 Visit                     Hendricks Limes, PT 08/22/22, 3:30 PM

## 2022-08-22 NOTE — TOC Progression Note (Signed)
Transition of Care [facility]) - Progression Note    Patient Details  Name: Catherine Young MRN: [id number] Date of Birth: 05/23/1931  [facility]) CM/SW Contact  Garret Reddish, RN Phone Number: 08/22/2022, 3:43 PM  Clinical Narrative:   SNF authorization request denied.    Patient would like to use the 3 respite days at [facility] and will be agreeable to private pay if additional SNF services are needed.  I have informed Sue Lush, Admission Coordinator with [facility] of the above information.  Sue Lush reports that she can accept patient to the SNF unit at [facility] on tomorrow.  Patient son will be able to transport patient to facility on tomorrow.  Patient's son would like to have patient ready to discharge by 11 am.    I have informed Dr. Allena Katz of the above information.    TOC will continue to continue to follow for discharge planning.      Expected Discharge Plan: Skilled Nursing Facility Barriers to Discharge: No Barriers Identified  Expected Discharge Plan and Services   Discharge Planning Services: CM Consult Post Acute Care Choice: Skilled Nursing Facility Living arrangements for the past 2 months: Independent Living Facility (Patient was a resident at [facility] facility.)                                       Social Determinants of Health (SDOH) Interventions SDOH Screenings   Food Insecurity: No Food Insecurity (08/16/2022)  Housing: Low Risk  (08/16/2022)  Transportation Needs: No Transportation Needs (08/16/2022)  Utilities: Not At Risk (08/16/2022)  Alcohol Screen: Low Risk  (07/25/2022)  Depression (PHQ2-9): Low Risk  (07/25/2022)  Financial Resource Strain: Low Risk  (07/25/2022)  Physical Activity: Insufficiently Active (07/25/2022)  Social Connections: Socially Isolated (07/25/2022)  Stress: No Stress Concern Present (07/25/2022)  Tobacco Use: Medium Risk (08/16/2022)    Readmission Risk Interventions     No data to  display

## 2022-08-22 NOTE — TOC Progression Note (Signed)
Transition of Care [facility]) - Progression Note    Patient Details  Name: Catherine Young MRN: [id number] Date of Birth: 08/29/1931  [facility]) CM/SW Contact  Garret Reddish, RN Phone Number: 08/22/2022, 12:47 PM  Clinical Narrative:   Chart reviewed.  SNF authorization continues to be pending.  I have spoken to [facility] [employer].  Gregary Signs informs me that authorization is pending, MD to review.  Gregary Signs has informed me that currently no additional clinical information has been requested.  Gregary Signs reports if their is additional information that is needed team member from [facility] will call and make me aware.    I have spoken with Sue Lush, Admission Coordinator at [facility].  She informs me that they generally like to wait for an approval or denial from the insurance company.  Sue Lush reports that patient does have 3 free Respite days that can be used if the claim is denied.  Sue Lush also reports that if more then the 3 days are needed patient can also pay privately for SNF services.    TOC will continue to follow for discharge planning.      Expected Discharge Plan: Skilled Nursing Facility Barriers to Discharge: No Barriers Identified  Expected Discharge Plan and Services   Discharge Planning Services: CM Consult Post Acute Care Choice: Skilled Nursing Facility Living arrangements for the past 2 months: Independent Living Facility (Patient was a resident at [facility] Independent [facility] facility.)                                       Social Determinants of Health (SDOH) Interventions SDOH Screenings   Food Insecurity: No Food Insecurity (08/16/2022)  Housing: Low Risk  (08/16/2022)  Transportation Needs: No Transportation Needs (08/16/2022)  Utilities: Not At Risk (08/16/2022)  Alcohol Screen: Low Risk  (07/25/2022)  Depression (PHQ2-9): Low Risk  (07/25/2022)  Financial Resource Strain: Low Risk  (07/25/2022)  Physical Activity: Insufficiently Active  (07/25/2022)  Social Connections: Socially Isolated (07/25/2022)  Stress: No Stress Concern Present (07/25/2022)  Tobacco Use: Medium Risk (08/16/2022)    Readmission Risk Interventions     No data to display

## 2022-08-22 NOTE — Progress Notes (Signed)
Occupational Therapy Treatment Patient Details Name: Catherine Young MRN: [id number] DOB: 10-04-1931 Today's Date: 08/22/2022   History of present illness Catherine Young is a 89 y.o. female with history of osteopenia, hypertension, cataracts presents emergency department with continued right hip pain.  Patient was seen on July 13 for a fall.  CT of the head, C-spine, and pelvis were all negative for any acute abnormality. Returned to ED following additional fall and persistent pain. Repeat CT scan shows right sacral ala fracture, inferior pubic ramus fracture, and superior pubic ramus fractures.   OT comments  Chart reviewed to date, pt greeted in bed, agreeable to OT tx session targeting improved safe ADL completion via task oriented training, simulated ADL tasks. Pt is pleasant, mildly confused. Improvements noted in overall acitivty tolerance with pt amb in hallway approx 200' with RW and toileting with supervision. Toilet transfers completed with CGA with RW. Transfer into shower completed with MIN A with HHA and step by step vcs. Pt is making progress towards goals, OT will continue to follow acutely.    Recommendations for follow up therapy are one component of a multi-disciplinary discharge planning process, led by the attending physician.  Recommendations may be updated based on patient status, additional functional criteria and insurance authorization.    Assistance Recommended at Discharge Intermittent Supervision/Assistance  Patient can return home with the following  A little help with walking and/or transfers;Assistance with cooking/housework;Direct supervision/assist for medications management;Direct supervision/assist for financial management;Assist for transportation;Help with stairs or ramp for entrance;A little help with bathing/dressing/bathroom   Equipment Recommendations       Recommendations for Other Services      Precautions / Restrictions  Precautions Precautions: Fall Restrictions Weight Bearing Restrictions: Yes       Mobility Bed Mobility Overal bed mobility: Needs Assistance Bed Mobility: Supine to Sit, Sit to Supine     Supine to sit: Modified independent (Device/Increase time) Sit to supine: Modified independent (Device/Increase time)        Transfers Overall transfer level: Needs assistance Equipment used: Rolling walker (2 wheels) Transfers: Sit to/from Stand Sit to Stand: Supervision, Min guard                 Balance Overall balance assessment: Needs assistance Sitting-balance support: Feet supported Sitting balance-Leahy Scale: Good     Standing balance support: Bilateral upper extremity supported, During functional activity, Reliant on assistive device for balance Standing balance-Leahy Scale: Fair                             ADL either performed or assessed with clinical judgement   ADL Overall ADL's : Needs assistance/impaired Eating/Feeding: Set up;Sitting   Grooming: Wash/dry hands;Standing;Supervision/safety Grooming Details (indicate cue type and reason): with RW at sink level                 Toilet Transfer: Min guard;Rolling walker (2 wheels);Ambulation;Cueing for sequencing;Cueing for safety   Toileting- Clothing Manipulation and Hygiene: Supervision/safety;Sitting/lateral lean   Tub/ Shower Transfer: Minimal assistance Tub/Shower Transfer Details (indicate cue type and reason): HHA into shower with step by step vcs Functional mobility during ADLs: Rolling walker (2 wheels);Min guard;Cueing for safety;Cueing for sequencing (approx 200' in hallway;intermittent vcs throughout for sequencing/technique)      Extremity/Trunk Assessment              Vision       Perception     Praxis  Cognition Arousal/Alertness: Awake/alert Behavior During Therapy: WFL for tasks assessed/performed Overall Cognitive Status: No family/caregiver present to  determine baseline cognitive functioning Area of Impairment: Safety/judgement, Awareness, Problem solving                     Memory: Decreased short-term memory Following Commands: Follows one step commands consistently, Follows one step commands with increased time, Follows multi-step commands inconsistently Safety/Judgement: Decreased awareness of deficits Awareness: Emergent Problem Solving: Requires verbal cues, Requires tactile cues, Slow processing, Difficulty sequencing          Exercises Other Exercises Other Exercises: pt reports anxiety regarding current discharge plan, states she wants to leave the hospital asap; [facility] messaged via secure chat with pt asking for an update    Shoulder Instructions       General Comments pt continues to be a high falls risk due to cognition/unfamiliar environment    Pertinent Vitals/ Pain       Pain Assessment Pain Assessment: 0-10 Pain Score: 6  Pain Location: RLE with mobility Pain Descriptors / Indicators: Discomfort, Guarding Pain Intervention(s): Limited activity within patient's tolerance, Monitored during session, RN gave pain meds during session  Home Living                                          Prior Functioning/Environment              Frequency  Min 1X/week        Progress Toward Goals  OT Goals(current goals can now be found in the care plan section)  Progress towards OT goals: Progressing toward goals     Plan Discharge plan remains appropriate    Co-evaluation                 AM-PAC OT "6 Clicks" Daily Activity     Outcome Measure   Help from another person eating meals?: None Help from another person taking care of personal grooming?: None Help from another person toileting, which includes using toliet, bedpan, or urinal?: None Help from another person bathing (including washing, rinsing, drying)?: A Little Help from another person to put on and taking off  regular upper body clothing?: None Help from another person to put on and taking off regular lower body clothing?: A Little 6 Click Score: 22    End of Session Equipment Utilized During Treatment: Rolling walker (2 wheels)  OT Visit Diagnosis: Unsteadiness on feet (R26.81);Repeated falls (R29.6)   Activity Tolerance Patient tolerated treatment well   Patient Left in bed;with call bell/phone within reach;with bed alarm set   Nurse Communication          Time: 1610-9604 OT Time Calculation (min): 28 min  Charges: OT General Charges $OT Visit: 1 Visit OT Treatments $Self Care/Home Management : 8-22 mins $Therapeutic Activity: 8-22 mins  Oleta Mouse, OTD OTR/L  08/22/22, 1:35 PM

## 2022-08-22 NOTE — TOC Progression Note (Signed)
Transition of Care [facility]) - Progression Note    Patient Details  Name: Catherine Young MRN: [id number] Date of Birth: November 02, 1931  Transition of Care [facility]) CM/SW Contact  Garret Reddish, RN Phone Number: 08/22/2022, 2:46 PM  Clinical Narrative:   Received call from [facility].  They have offered patient a Peer to Peer.  The number for the peer to peer is [phone] option 5.  Provider will need patient's name, DOB, and member ID number.  Deadline is 9 am on 08-23-2022. All above information given to provider.   TOC will continue to follow for discharge planning.      Expected Discharge Plan: Skilled Nursing Facility Barriers to Discharge: No Barriers Identified  Expected Discharge Plan and Services   Discharge Planning Services: CM Consult Post Acute Care Choice: Skilled Nursing Facility Living arrangements for the past 2 months: Independent Living Facility (Patient was a resident at [facility] Independent [facility] facility.)                                       Social Determinants of Health (SDOH) Interventions SDOH Screenings   Food Insecurity: No Food Insecurity (08/16/2022)  Housing: Low Risk  (08/16/2022)  Transportation Needs: No Transportation Needs (08/16/2022)  Utilities: Not At Risk (08/16/2022)  Alcohol Screen: Low Risk  (07/25/2022)  Depression (PHQ2-9): Low Risk  (07/25/2022)  Financial Resource Strain: Low Risk  (07/25/2022)  Physical Activity: Insufficiently Active (07/25/2022)  Social Connections: Socially Isolated (07/25/2022)  Stress: No Stress Concern Present (07/25/2022)  Tobacco Use: Medium Risk (08/16/2022)    Readmission Risk Interventions     No data to display

## 2022-08-22 NOTE — Plan of Care (Signed)

## 2022-08-22 NOTE — Progress Notes (Signed)
[facility]  - [place] at [facility]   PATIENT NAME: Catherine Young    MR#:  [id number]  DATE OF BIRTH:  [birth year]  SUBJECTIVE:  patient came in after accidental fall while doing chair yoga. Lives at [facility] independent facility. No family at bedside.  Overall improving. Sitting out in the chair VITALS:  Blood pressure (!) 141/74, pulse 64, temperature 98.1 F (36.7 C), resp. rate 15, height 5\' 4"  (1.626 m), weight 50 kg, SpO2 92%.  PHYSICAL EXAMINATION:   GENERAL:  89 y.o.-year-old patient with no acute distress. thin LUNGS: Normal breath sounds bilaterally, no wheezing CARDIOVASCULAR: S1, S2 normal. No murmur   ABDOMEN: Soft, nontender, nondistended. Bowel sounds present.  EXTREMITIES: No  edema b/l.    NEUROLOGIC: nonfocal  patient is alert and awake  LABORATORY PANEL:  CBC Recent Labs  Lab 08/17/22 0401  WBC 6.2  HGB 10.3*  HCT 32.3*  PLT 402*    Chemistries  Recent Labs  Lab 08/17/22 0401  NA 134*  K 3.8  CL 104  CO2 27  GLUCOSE 90  BUN 15  CREATININE 0.85  CALCIUM 8.0*  AST 16  ALT 10  ALKPHOS 66  BILITOT 0.5   Cardiac Enzymes No results for input(s): "TROPONINI" in the last 168 hours. RADIOLOGY:  No results found.  Assessment and Plan  Catherine Young is a 89 y.o. female with medical history significant of depression, hypertension, dementia presenting with fall and pelvic fracture.  Patient noted had a fall around July 13.  Was evaluated in the ER with normal imaging.  Patient reports going back to facility and having a subsequent fall approximately 5 days later after a session fo chair yoga her balance went off and fell.   CT pelvis: Highly comminuted and displaced fractures of the right pubic rami and pubic symphysis, Retzius hematoma.  Right sacral alla comminuted fracture.    Pelvic fracture (HCC), right S/p accidental fall --Noted highly comminuted and displaced fractures of the right pubic rami and pubic symphysis  with Retzius hematoma status post traumatic fall --Dr. Allena Katz with orthopedics reviewed case-nonoperable -- PT OT evaluation and pt will benefit from rehab --Pain control ---Fall precautions  Vitamin D deficiency -- will replace orally    Primary hypertension --BP stable --Titrate home regimen   Dementia (HCC) --Continue Aricept   Chronic insomnia --Continue trazodone  Will d/c to rehab once insurance auth obtained   Family communication :unable to leave VM for son Consults :ortho CODE STATUS: DNR DVT Prophylaxis : Level of care: Med-Surg  Patient is medically best at baseline. Awaiting TOC for discharge planning to rehab.  TOTAL TIME TAKING CARE OF THIS PATIENT: 35 minutes.  >50% time spent on counselling and coordination of care  Note: This dictation was prepared with Dragon dictation along with smaller phrase technology. Any transcriptional errors that result from this process are unintentional.  Enedina Finner M.D    [facility]   CC: Primary care physician; Excell Seltzer, MD

## 2022-08-23 DIAGNOSIS — S329XXA Fracture of unspecified parts of lumbosacral spine and pelvis, initial encounter for closed fracture: Secondary | ICD-10-CM | POA: Diagnosis not present

## 2022-08-23 NOTE — Plan of Care (Signed)
  Problem: Education: Goal: Knowledge of General Education information will improve Description Including pain rating scale, medication(s)/side effects and non-pharmacologic comfort measures Outcome: Progressing   Problem: Health Behavior/Discharge Planning: Goal: Ability to manage health-related needs will improve Outcome: Progressing   Problem: Clinical Measurements: Goal: Ability to maintain clinical measurements within normal limits will improve Outcome: Progressing Goal: Will remain free from infection Outcome: Progressing   Problem: Activity: Goal: Risk for activity intolerance will decrease Outcome: Progressing   Problem: Coping: Goal: Level of anxiety will decrease Outcome: Progressing   Problem: Pain Managment: Goal: General experience of comfort will improve Outcome: Progressing   Problem: Safety: Goal: Ability to remain free from injury will improve Outcome: Progressing   Problem: Skin Integrity: Goal: Risk for impaired skin integrity will decrease Outcome: Progressing

## 2022-08-23 NOTE — TOC Transition Note (Signed)
Transition of Care [facility]) - CM/SW Discharge Note   Patient Details  Name: Catherine Young MRN: [id number] Date of Birth: 1931-03-31  Transition of Care [facility]) CM/SW Contact:  Garret Reddish, RN Phone Number: 08/23/2022, 10:34 AM   Clinical Narrative:   Chart reviewed.  Patient will be a discharge to [facility] unit today.    I have spoken with Sue Lush, Admissions Coordinator at [facility].  She informs that patient is able to discharge to the SNF unit today.  Sue Lush informs me that patient will be going to room 113 and the number to call report is [phone]. I have sent patient's discharge summary, discharge orders and SNF transfer Summary via the Epic hub.    I have informed staff nurse of the above information.    Patient's son [name] will transport her to the facility today.      Final next level of care: Skilled Nursing Facility Barriers to Discharge: No Barriers Identified   Patient Goals and CMS Choice CMS Medicare.gov Compare Post Acute Care list provided to:: Patient Choice offered to / list presented to : Patient  Discharge Placement                Patient chooses bed at: [facility] Patient to be transferred to facility by: Patient's son will transport patient to the facility today. Name of family member notified: [name] Patient and family notified of of transfer: 08/23/22  Discharge Plan and Services Additional resources added to the After Visit Summary for     Discharge Planning Services: CM Consult Post Acute Care Choice: Skilled Nursing Facility                               Social Determinants of Health (SDOH) Interventions SDOH Screenings   Food Insecurity: No Food Insecurity (08/16/2022)  Housing: Low Risk  (08/16/2022)  Transportation Needs: No Transportation Needs (08/16/2022)  Utilities: Not At Risk (08/16/2022)  Alcohol Screen: Low Risk  (07/25/2022)  Depression (PHQ2-9): Low Risk  (07/25/2022)  Financial Resource Strain: Low  Risk  (07/25/2022)  Physical Activity: Insufficiently Active (07/25/2022)  Social Connections: Socially Isolated (07/25/2022)  Stress: No Stress Concern Present (07/25/2022)  Tobacco Use: Medium Risk (08/16/2022)     Readmission Risk Interventions     No data to display

## 2022-08-23 NOTE — Discharge Summary (Signed)
Physician Discharge Summary   Patient: Catherine Young MRN: [id number] DOB: 1931/03/19  Admit date:     08/16/2022  Discharge date: 08/23/22  Discharge Physician: Enedina Finner   PCP: Excell Seltzer, MD   Recommendations at discharge:    F/u PCP in 1-2 weeks F/u Ortho Dr Signa Kell in about 2 weeks  Discharge Diagnoses: Principal Problem:   Pelvic fracture [facility]) Active Problems:   Chronic insomnia   Dementia (HCC)   Primary hypertension   Fall   Closed fracture of right inferior pubic ramus (HCC)  Catherine Young is a 89 y.o. female with medical history significant of depression, hypertension, dementia presenting with fall and pelvic fracture.  Patient noted had a fall around July 13.  Was evaluated in the ER with normal imaging.  Patient reports going back to facility and having a subsequent fall approximately 5 days later after a session fo chair yoga her balance went off and fell.     CT pelvis: Highly comminuted and displaced fractures of the right pubic rami and pubic symphysis, Retzius hematoma.  Right sacral alla comminuted fracture.     Pelvic fracture (HCC), right S/p accidental fall --Noted highly comminuted and displaced fractures of the right pubic rami and pubic symphysis with Retzius hematoma status post traumatic fall --Dr. Allena Katz with orthopedics reviewed case-nonoperable -- PT OT evaluation and pt will benefit from rehab--Insurance declined rehab. Did Peer to Peer and spoke with Dr Derinda Late --Pain control ---Fall precautions   Vitamin D deficiency -- will replace orally    Primary hypertension --BP stable --Titrate home regimen   Dementia (HCC) --Continue Aricept   Chronic insomnia --Continue trazodone   Pt will go to [facility] (respite) per TOC. Son to come pick up today   Family communication :son aware of plan Consults :ortho CODE STATUS: DNR      Pain control - [employer] Controlled Substance Reporting System database was  reviewed. and patient was instructed, not to drive, operate heavy machinery, perform activities at heights, swimming or participation in water activities or provide baby-sitting services while on Pain, Sleep and Anxiety Medications; until their outpatient Physician has advised to do so again. Also recommended to not to take more than prescribed Pain, Sleep and Anxiety Medications.  Consultants: ortho Disposition: Rehabilitation facility Diet recommendation:  Discharge Diet Orders (From admission, onward)     Start     Ordered   08/23/22 0000  Diet - low sodium heart healthy        08/23/22 0952           Cardiac diet DISCHARGE MEDICATION: Allergies as of 08/23/2022       Reactions   Fosamax [alendronate] Diarrhea   Prednisone    Increased blood pressure and insomnia        Medication List     STOP taking these medications    Advil PM 200-25 MG Caps Generic drug: Ibuprofen-diphenhydrAMINE HCl   traMADol 50 MG tablet Commonly known as: ULTRAM       TAKE these medications    amLODipine 5 MG tablet Commonly known as: NORVASC Take 1 tablet (5 mg total) by mouth daily. What changed:  medication strength See the new instructions.   clonazePAM 1 MG tablet Commonly known as: KLONOPIN TAKE 1/2 TABLET(0.5 MG) BY MOUTH TWICE DAILY AS NEEDED FOR ANXIETY What changed:  how much to take how to take this when to take this reasons to take this   donepezil 10 MG tablet  Commonly known as: ARICEPT Take 1 tablet (10 mg total) by mouth at bedtime.   oxyCODONE-acetaminophen 7.5-325 MG tablet Commonly known as: PERCOCET Take 1-2 tablets by mouth every 8 (eight) hours as needed for moderate pain.   polyethylene glycol 17 g packet Commonly known as: MIRALAX / GLYCOLAX Take 17 g by mouth daily as needed.   sertraline 100 MG tablet Commonly known as: ZOLOFT TAKE 1 TABLET(100 MG) BY MOUTH AT BEDTIME What changed:  how much to take how to take this when to take this    traZODone 100 MG tablet Commonly known as: DESYREL TAKE 1 TABLET(50 MG) BY MOUTH AT BEDTIME AS NEEDED FOR SLEEP What changed:  how much to take how to take this when to take this reasons to take this   Vitamin D (Ergocalciferol) 1.25 MG (50000 UNIT) Caps capsule Commonly known as: DRISDOL Take 1 capsule (50,000 Units total) by mouth every 7 (seven) days.   vitamin D3 25 MCG tablet Commonly known as: CHOLECALCIFEROL Take 1 tablet (1,000 Units total) by mouth daily.        Follow-up Information     Excell Seltzer, MD. Schedule an appointment as soon as possible for a visit in 1 week(s).   Specialty: Family Medicine Why: hospital f/u Contact information: [address] [place] [address] [phone]         Signa Kell, MD. Schedule an appointment as soon as possible for a visit in 2 week(s).   Specialty: Orthopedic Surgery Why: f/u Pelvic fracture Contact information: [address] [place] [address] [phone]                Discharge Exam: Filed Weights   08/16/22 0822  Weight: 50 kg   GENERAL:  89 y.o.-year-old patient with no acute distress. thin LUNGS: Normal breath sounds bilaterally, no wheezing CARDIOVASCULAR: S1, S2 normal. No murmur   ABDOMEN: Soft, nontender, nondistended. Bowel sounds present.  EXTREMITIES: No  edema b/l.     Condition at discharge: fair  The results of significant diagnostics from this hospitalization (including imaging, microbiology, ancillary and laboratory) are listed below for reference.   Imaging Studies: CT PELVIS WO CONTRAST  Result Date: 08/16/2022 CLINICAL DATA:  89 year old female status post fall 10 days ago with continued pain to the right hip and groin. EXAM: CT PELVIS WITHOUT CONTRAST TECHNIQUE: Multidetector CT imaging of the pelvis was performed following the standard protocol without intravenous contrast. RADIATION DOSE REDUCTION: This exam was performed according to the  departmental dose-optimization program which includes automated exposure control, adjustment of the mA and/or kV according to patient size and/or use of iterative reconstruction technique. COMPARISON:  Pelvis CT on 08/06/2022. FINDINGS: Urinary Tract: Decompressed bladder with mild mass effect related to oval space-occupying mass in the left space of Retzius which is new (series 4, image 279 and series 8, image 25. Posttraumatic hematoma favored (see pelvis osseous findings below). Kidneys and ureters is *CRASH * that kidneys not included. No ureteral enlargement. Bowel: No dilated visible small and large bowel. Diverticulosis of the descending and sigmoid colon. No free air or free fluid. Vascular/Lymphatic: Extensive Aortoiliac calcified atherosclerosis. Normal caliber distal abdominal aorta. Vascular patency is not evaluated in the absence of IV contrast. No lymphadenopathy identified. Reproductive: Surgically absent uterus. Diminutive or absent ovaries. Other: No pelvis free fluid. Incidental numerous pelvic phleboliths. Musculoskeletal: Diffuse osteopenia. Comminuted but minimally displaced fracture of the right sacral ala is now apparent on series 3, images 26 through 42.  SI joints remain symmetric. Left sacral ala appears intact. Visible L4 and L5 vertebrae appear grossly stable and intact. Bilateral iliac wings are intact. But there is severe comminution and displacement now of the medial right pubic rami, extending to the pubic symphysis. See the inferior ramus on series 3, image 108. And no displacement of the superior ramus relative to the symphysis on series 6 coronal image 28. Left pubic rami appear stable and intact. No acetabular involvement. Femoral heads remain normally located. Hip joint spaces are stable. Proximal femurs appears stable and intact. New left space of Retzius 3 x 5 by 2.5 cm oval mass (series 8, image 24) is most compatible with hematoma tracking away from the pubic symphysis in this  clinical setting (volume estimated at 19 mL). No other pelvic sidewall hematoma. No discrete intramuscular hematoma. IMPRESSION: 1. Highly comminuted and displaced fractures of the right pubic rami and pubic symphysis. Displacement (series 6, image 28) and associated left space of Retzius hematoma (19 mL). Comminuted, less displaced fracture of the right sacral ala. Hips remain intact. Recommend Orthopedic consultation. 2. Underlying Osteopenia. 3.  Aortic Atherosclerosis (ICD10-I70.0). Electronically Signed   By: Odessa Fleming M.D.   On: 08/16/2022 09:17   CT PELVIS WO CONTRAST  Result Date: 08/06/2022 CLINICAL DATA:  Right hip pain. EXAM: CT PELVIS WITHOUT CONTRAST TECHNIQUE: Multidetector CT imaging of the pelvis was performed following the standard protocol without intravenous contrast. RADIATION DOSE REDUCTION: This exam was performed according to the departmental dose-optimization program which includes automated exposure control, adjustment of the mA and/or kV according to patient size and/or use of iterative reconstruction technique. COMPARISON:  Hip x-ray August 06, 2022 FINDINGS: Urinary Tract:  No abnormality visualized. Bowel:  Unremarkable visualized pelvic bowel loops. Vascular/Lymphatic: No pathologically enlarged lymph nodes. Atherosclerosis. Reproductive:  No mass or other significant abnormality Other:  None. Musculoskeletal: No fractures. No suspicious bone lesions identified. Moderate osteoarthritic changes of bilateral hips and lower lumbosacral spine. IMPRESSION: 1. No acute fracture or dislocation identified about the pelvis. 2. Moderate osteoarthritic changes of bilateral hips and lower lumbosacral spine. 3. Atherosclerosis. Electronically Signed   By: Ted Mcalpine M.D.   On: 08/06/2022 13:22   CT Head Wo Contrast  Result Date: 08/06/2022 CLINICAL DATA:  Head trauma, minor (Age >= 65y); Neck trauma (Age >= 65y) EXAM: CT HEAD WITHOUT CONTRAST CT CERVICAL SPINE WITHOUT CONTRAST  TECHNIQUE: Multidetector CT imaging of the head and cervical spine was performed following the standard protocol without intravenous contrast. Multiplanar CT image reconstructions of the cervical spine were also generated. RADIATION DOSE REDUCTION: This exam was performed according to the departmental dose-optimization program which includes automated exposure control, adjustment of the mA and/or kV according to patient size and/or use of iterative reconstruction technique. COMPARISON:  11/11/2019 FINDINGS: CT HEAD FINDINGS Brain: No evidence of acute infarction, hemorrhage, hydrocephalus, extra-axial collection or mass lesion/mass effect. Patchy low-density changes within the periventricular and subcortical white matter most compatible with chronic microvascular ischemic change. Moderate diffuse cerebral volume loss. Vascular: Atherosclerotic calcifications involving the large vessels of the skull base. No unexpected hyperdense vessel. Skull: Normal. Negative for fracture or focal lesion. Sinuses/Orbits: Mild mucosal thickening of the maxillary sinuses. No acute abnormality. Other: Negative for scalp hematoma. CT CERVICAL SPINE FINDINGS Alignment: Facet joints are aligned without dislocation or traumatic listhesis. Dens and lateral masses are aligned. Exaggerated cervical lordosis. Skull base and vertebrae: No acute fracture. No primary bone lesion or focal pathologic process. Soft tissues and spinal canal: No prevertebral  fluid or swelling. No visible canal hematoma. Disc levels: Multilevel degenerative disc disease and facet arthropathy. Upper chest: Negative. Other: Bilateral carotid atherosclerosis. IMPRESSION: 1. No acute intracranial abnormality. 2. No acute fracture or subluxation of the cervical spine. Electronically Signed   By: Duanne Guess D.O.   On: 08/06/2022 12:17   CT Cervical Spine Wo Contrast  Result Date: 08/06/2022 CLINICAL DATA:  Head trauma, minor (Age >= 65y); Neck trauma (Age >= 65y)  EXAM: CT HEAD WITHOUT CONTRAST CT CERVICAL SPINE WITHOUT CONTRAST TECHNIQUE: Multidetector CT imaging of the head and cervical spine was performed following the standard protocol without intravenous contrast. Multiplanar CT image reconstructions of the cervical spine were also generated. RADIATION DOSE REDUCTION: This exam was performed according to the departmental dose-optimization program which includes automated exposure control, adjustment of the mA and/or kV according to patient size and/or use of iterative reconstruction technique. COMPARISON:  11/11/2019 FINDINGS: CT HEAD FINDINGS Brain: No evidence of acute infarction, hemorrhage, hydrocephalus, extra-axial collection or mass lesion/mass effect. Patchy low-density changes within the periventricular and subcortical white matter most compatible with chronic microvascular ischemic change. Moderate diffuse cerebral volume loss. Vascular: Atherosclerotic calcifications involving the large vessels of the skull base. No unexpected hyperdense vessel. Skull: Normal. Negative for fracture or focal lesion. Sinuses/Orbits: Mild mucosal thickening of the maxillary sinuses. No acute abnormality. Other: Negative for scalp hematoma. CT CERVICAL SPINE FINDINGS Alignment: Facet joints are aligned without dislocation or traumatic listhesis. Dens and lateral masses are aligned. Exaggerated cervical lordosis. Skull base and vertebrae: No acute fracture. No primary bone lesion or focal pathologic process. Soft tissues and spinal canal: No prevertebral fluid or swelling. No visible canal hematoma. Disc levels: Multilevel degenerative disc disease and facet arthropathy. Upper chest: Negative. Other: Bilateral carotid atherosclerosis. IMPRESSION: 1. No acute intracranial abnormality. 2. No acute fracture or subluxation of the cervical spine. Electronically Signed   By: Duanne Guess D.O.   On: 08/06/2022 12:17   DG Hip Unilat With Pelvis 2-3 Views Right  Result Date:  08/06/2022 CLINICAL DATA:  Fall from chair.  Right-sided groin pain. EXAM: DG HIP (WITH OR WITHOUT PELVIS) 3V RIGHT COMPARISON:  None Available. FINDINGS: Moderate degenerative changes are present at the right hip. Osteopenia noted. Vascular calcifications are present. Degenerative changes are present in the lower lumbar spine. IMPRESSION: 1. No acute fracture or dislocation. 2. Moderate degenerative changes at the right hip. Electronically Signed   By: Marin Roberts M.D.   On: 08/06/2022 12:11   DG Knee Complete 4 Views Right  Result Date: 08/06/2022 CLINICAL DATA:  Fall. EXAM: RIGHT KNEE - COMPLETE 4+ VIEW COMPARISON:  None Available. FINDINGS: Right knee is located without a fracture. Alignment is normal. Probable small suprapatellar joint effusion. Scattered osteoarthritis with osteophytosis. Evidence for chondrocalcinosis. IMPRESSION: 1. No acute bone abnormality to the right knee. 2. Mild-to-moderate osteoarthritis.  Probable small joint effusion. 3. Chondrocalcinosis. Electronically Signed   By: Richarda Overlie M.D.   On: 08/06/2022 12:10    Microbiology: Results for orders placed or performed in visit on 07/07/22  Urine Culture     Status: None   Collection Time: 07/07/22  2:23 PM   Specimen: Urine  Result Value Ref Range Status   MICRO NUMBER: [id number]  Final   SPECIMEN QUALITY: Adequate  Final   Sample Source URINE  Final   STATUS: FINAL  Final   Result: No Growth  Final    Labs: CBC: Recent Labs  Lab 08/17/22 0401  WBC 6.2  HGB  10.3*  HCT 32.3*  MCV 100.6*  PLT 402*   Basic Metabolic Panel: Recent Labs  Lab 08/17/22 0401  NA 134*  K 3.8  CL 104  CO2 27  GLUCOSE 90  BUN 15  CREATININE 0.85  CALCIUM 8.0*   Liver Function Tests: Recent Labs  Lab 08/17/22 0401  AST 16  ALT 10  ALKPHOS 66  BILITOT 0.5  PROT 5.9*  ALBUMIN 3.0*    Discharge time spent: greater than 30 minutes.  Signed: Enedina Finner, MD [facility] 08/23/2022

## 2022-08-23 NOTE — Care Management Important Message (Signed)
Important Message  Patient Details  Name: Catherine Young MRN: [id number] Date of Birth: 10-25-1931   Medicare Important Message Given:  Yes     Olegario Messier A Lamondre Wesche 08/23/2022, 10:54 AM

## 2022-08-24 ENCOUNTER — Non-Acute Institutional Stay (SKILLED_NURSING_FACILITY): Payer: Medicare PPO | Admitting: Student

## 2022-08-24 ENCOUNTER — Encounter: Payer: Self-pay | Admitting: Student

## 2022-08-24 DIAGNOSIS — S329XXA Fracture of unspecified parts of lumbosacral spine and pelvis, initial encounter for closed fracture: Secondary | ICD-10-CM

## 2022-08-24 DIAGNOSIS — R296 Repeated falls: Secondary | ICD-10-CM

## 2022-08-24 DIAGNOSIS — S32591K Other specified fracture of right pubis, subsequent encounter for fracture with nonunion: Secondary | ICD-10-CM

## 2022-08-24 DIAGNOSIS — F03A4 Unspecified dementia, mild, with anxiety: Secondary | ICD-10-CM

## 2022-08-24 DIAGNOSIS — F411 Generalized anxiety disorder: Secondary | ICD-10-CM

## 2022-08-24 DIAGNOSIS — F331 Major depressive disorder, recurrent, moderate: Secondary | ICD-10-CM

## 2022-08-24 DIAGNOSIS — M810 Age-related osteoporosis without current pathological fracture: Secondary | ICD-10-CM

## 2022-08-24 DIAGNOSIS — F5104 Psychophysiologic insomnia: Secondary | ICD-10-CM | POA: Diagnosis not present

## 2022-08-24 DIAGNOSIS — I10 Essential (primary) hypertension: Secondary | ICD-10-CM | POA: Diagnosis not present

## 2022-08-24 DIAGNOSIS — Z79899 Other long term (current) drug therapy: Secondary | ICD-10-CM

## 2022-08-24 DIAGNOSIS — S32591D Other specified fracture of right pubis, subsequent encounter for fracture with routine healing: Secondary | ICD-10-CM | POA: Diagnosis not present

## 2022-08-24 MED ORDER — CLONAZEPAM 1 MG PO TABS
ORAL_TABLET | ORAL | 0 refills | Status: DC
Start: 2022-08-24 — End: 2022-08-26

## 2022-08-24 MED ORDER — OXYCODONE-ACETAMINOPHEN 7.5-325 MG PO TABS
1.0000 | ORAL_TABLET | Freq: Three times a day (TID) | ORAL | 0 refills | Status: DC | PRN
Start: 2022-08-24 — End: 2022-09-27
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
## 2022-08-25 ENCOUNTER — Encounter: Payer: Self-pay | Admitting: Student

## 2022-08-25 DIAGNOSIS — S32591D Other specified fracture of right pubis, subsequent encounter for fracture with routine healing: Secondary | ICD-10-CM | POA: Diagnosis not present

## 2022-08-26 ENCOUNTER — Non-Acute Institutional Stay (SKILLED_NURSING_FACILITY): Payer: Medicare PPO | Admitting: Student

## 2022-08-26 ENCOUNTER — Encounter: Payer: Self-pay | Admitting: Student

## 2022-08-26 ENCOUNTER — Telehealth: Payer: Self-pay | Admitting: Student

## 2022-08-26 DIAGNOSIS — R0989 Other specified symptoms and signs involving the circulatory and respiratory systems: Secondary | ICD-10-CM | POA: Diagnosis not present

## 2022-08-26 DIAGNOSIS — R509 Fever, unspecified: Secondary | ICD-10-CM

## 2022-08-26 DIAGNOSIS — S32591D Other specified fracture of right pubis, subsequent encounter for fracture with routine healing: Secondary | ICD-10-CM | POA: Diagnosis not present

## 2022-08-26 DIAGNOSIS — R0902 Hypoxemia: Secondary | ICD-10-CM

## 2022-08-26 DIAGNOSIS — I10 Essential (primary) hypertension: Secondary | ICD-10-CM | POA: Diagnosis not present

## 2022-08-26 LAB — CBC AND DIFFERENTIAL
HCT: 31 — AB (ref 36–46)
Hemoglobin: 10.2 — AB (ref 12.0–16.0)
Platelets: 335 10*3/uL (ref 150–400)
WBC: 12.4

## 2022-08-26 LAB — COMPREHENSIVE METABOLIC PANEL
Calcium: 8.3 — AB (ref 8.7–10.7)
eGFR: 49

## 2022-08-26 LAB — BASIC METABOLIC PANEL
BUN: 21 (ref 4–21)
CO2: 29 — AB (ref 13–22)
Chloride: 93 — AB (ref 99–108)
Creatinine: 1.1 (ref 0.5–1.1)
Glucose: 1402
Potassium: 4.3 mEq/L (ref 3.5–5.1)
Sodium: 130 — AB (ref 137–147)

## 2022-08-26 LAB — CBC: RBC: 3.14 — AB (ref 3.87–5.11)

## 2022-08-26 NOTE — Telephone Encounter (Signed)
Spoke with patient's son/HCPOA [name] who states his mother has been against invasive measures for intervention. If she doesn't want to go back to the hospital he and his sister would respect that. Confirmed her DNR status. State they would like to see how things go without going to the hospital if possible. Discussed risk for pneumonia and blood clot and while we can treat for pneumonia, there isn't a great option for blood clots without further imaging. He understands, would prefer to keep her at the facility and continue supportive care at this time.   Spoke with nurses at nursing home as well.   I spent 15 minutes discussing status and goals of care.   Coralyn Helling, MD, [facility] [phone]

## 2022-08-26 NOTE — Progress Notes (Signed)
Location:  Other [facility].  Nursing Home Room Number: [facility] [address] Place of Service:  SNF [id number]) Provider:  Dr. Earnestine Mealing  PCP: Excell Seltzer, MD  Patient Care Team: Excell Seltzer, MD as PCP - General (Family Medicine) [name], MD as Consulting Physician (Ophthalmology)  Extended Emergency Contact Information Primary Emergency Contact: [facility] Address: [address] [place] [phone] [name] Home Phone: [phone] Mobile Phone: [phone] Relation: Son Secondary Emergency Contact: [name],Debbie          [place], [place] Mobile Phone: [phone] Relation: Daughter  Code Status:  DNR Goals of care: Advanced Directive information    08/26/2022    1:28 PM  Advanced Directives  Does Patient Have a Medical Advance Directive? Yes  Type of Advance Directive Out of facility DNR (pink MOST or yellow form)  Does patient want to make changes to medical advance directive? No - Patient declined     Chief Complaint  Patient presents with   Acute Visit    Hypoxia     HPI:  Pt is a 89 y.o. female seen today for an acute visit for Hypoxia   Patient denies shortness of breath, chest pain at this time. She is eating well. Denies nausea or vomiting at this time. She is eating and drinking well. She states it's Friday the beginning of August I am at [facility]. She talks about chronic issues with diarrhea and she isn't sure if it is related to her spinal stenosis. She read online that spinal stenosis can lead to fecal incontinence in the past. She states she does not want to go back to the hospital  Nursing requested a visit as she had a fever (in her room) and when covers were removed, her temperature normalized to 98. Nursing state her O2 went to the 70s and came to 90s on 3LNC. Nursing states she declines going to the hospital.   Past Medical History:  Diagnosis Date   Arthritis    Basal  cell carcinoma of tragus, left    Cataract    right   Complication of anesthesia    COULD'T MOVE WHEN WAKING UP AFTER SECONT COLON SURGERY   Depression    Hypertension    Intestinal obstruction (HCC)    Osteoporosis    Past Surgical History:  Procedure Laterality Date   ABDOMINAL HYSTERECTOMY     partial, for menorrhagia   APPENDECTOMY     CATARACT EXTRACTION W/PHACO Right 05/25/2017   Procedure: CATARACT EXTRACTION PHACO AND INTRAOCULAR LENS PLACEMENT (IOC);  Surgeon: [name], MD;  Location: [facility] ORS;  Service: Ophthalmology;  Laterality: Right;  fluid pack lot #  [id number]  exp  12/24/2018 [place]   00:39.4 AP%   9.4 CDE    3.71    COLON SURGERY     INTESTINAL BLOCKAGE FROM SCAR TISSUE X 2   IR KYPHO LUMBAR INC FX REDUCE BONE BX UNI/BIL CANNULATION INC/IMAGING  11/30/2020    Allergies  Allergen Reactions   Fosamax [Alendronate] Diarrhea   Prednisone     Increased blood pressure and insomnia    Outpatient Encounter Medications as of 08/26/2022  Medication Sig   amLODipine (NORVASC) 5 MG tablet Take 1 tablet (5 mg total) by mouth daily.   cholecalciferol (CHOLECALCIFEROL) 25 MCG tablet Take 1 tablet (1,000 Units total) by mouth daily.   clonazePAM (KLONOPIN) 0.5 MG tablet  Take 0.5 tablets by mouth 2 (two) times daily as needed for anxiety.   donepezil (ARICEPT) 10 MG tablet Take 1 tablet (10 mg total) by mouth at bedtime.   ondansetron (ZOFRAN) 8 MG tablet Take 8 mg by mouth every 8 (eight) hours as needed for nausea or vomiting.   oxyCODONE-acetaminophen (PERCOCET) 7.5-325 MG tablet Take 1 tablet by mouth every 8 (eight) hours as needed for severe pain.   polyethylene glycol (MIRALAX / GLYCOLAX) 17 g packet Take 17 g by mouth daily as needed.   sertraline (ZOLOFT) 100 MG tablet TAKE 1 TABLET(100 MG) BY MOUTH AT BEDTIME   traZODone (DESYREL) 100 MG tablet TAKE 1 TABLET(50 MG) BY MOUTH AT BEDTIME AS NEEDED FOR SLEEP   Vitamin D, Ergocalciferol, (DRISDOL) 1.25 MG (50000  UNIT) CAPS capsule Take 1 capsule (50,000 Units total) by mouth every 7 (seven) days.   Zinc Oxide (TRIPLE PASTE) 12.8 % ointment Apply 1 Application topically. Every shift.   [DISCONTINUED] clonazePAM (KLONOPIN) 1 MG tablet TAKE 1/2 TABLET(0.5 MG) BY MOUTH TWICE DAILY AS NEEDED FOR ANXIETY   No facility-administered encounter medications on file as of 08/26/2022.    Review of Systems  Immunization History  Administered Date(s) Administered   Fluad Quad(high Dose 65+) 10/23/2018, 10/25/2019   Influenza,inj,quad, With Preservative 11/10/2016   Influenza-Unspecified 11/17/2016, 10/24/2017   Moderna Covid-19 Vaccine Bivalent Booster 51yrs & up 12/03/2021   Moderna Sars-Covid-2 Vaccination 02/08/2019, 05/14/2019, 11/13/2019, 10/15/2020   PFIZER(Purple Top)SARS-COV-2 Vaccination 08/05/2020   Pneumococcal Conjugate-13 10/25/2011, 11/22/2016   Pneumococcal Polysaccharide-23 12/13/2017   Pertinent  Health Maintenance Due  Topic Date Due   INFLUENZA VACCINE  08/25/2022   DEXA SCAN  Completed      03/23/2021    2:57 PM 06/22/2021   11:05 AM 04/19/2022   11:28 AM 06/29/2022    9:31 AM 07/25/2022    3:13 PM  Fall Risk  Falls in the past year? 0 0 1 0 1  Was there an injury with Fall? 0 0 0 0 0  Fall Risk Category Calculator 0 0 2 0 1  Fall Risk Category (Retired) Low Low     (RETIRED) Patient Fall Risk Level Low fall risk Low fall risk     Patient at Risk for Falls Due to No Fall Risks Medication side effect History of fall(s);Impaired mobility No Fall Risks No Fall Risks  Fall risk Follow up Falls evaluation completed Falls evaluation completed;Education provided;Falls prevention discussed Falls evaluation completed Falls evaluation completed Falls prevention discussed   Functional Status Survey:    Vitals:   08/26/22 1310  BP: 120/63  Pulse: 93  Resp: 14  Temp: (!) 100.9 F (38.3 C)  SpO2: 92%  Weight: 109 lb (49.4 kg)  Height: 5\' 4"  (1.626 m)   Body mass index is 18.71  kg/m. Physical Exam Constitutional:      Appearance: Normal appearance.  Cardiovascular:     Rate and Rhythm: Normal rate and regular rhythm.  Pulmonary:     Effort: Pulmonary effort is normal.     Comments: Decreased breath sounds on the right Abdominal:     General: Abdomen is flat.     Palpations: Abdomen is soft.  Musculoskeletal:     Right lower leg: No edema.     Left lower leg: No edema.  Skin:    General: Skin is warm and dry.  Neurological:     Mental Status: She is alert and oriented to person, place, and time.  Psychiatric:  Mood and Affect: Mood normal.     Labs reviewed: Recent Labs    07/26/22 1201 08/16/22 0934 08/17/22 0401  NA 136 136 134*  K 4.4 3.8 3.8  CL 97 101 104  CO2 30 28 27   GLUCOSE 93 89 90  BUN 17 12 15   CREATININE 0.93 0.74 0.85  CALCIUM 9.6 8.6* 8.0*   Recent Labs    08/17/22 0401  AST 16  ALT 10  ALKPHOS 66  BILITOT 0.5  PROT 5.9*  ALBUMIN 3.0*   Recent Labs    08/16/22 0934 08/17/22 0401  WBC 7.2 6.2  NEUTROABS 4.6  --   HGB 10.5* 10.3*  HCT 32.8* 32.3*  MCV 100.0 100.6*  PLT 389 402*   Lab Results  Component Value Date   TSH 1.43 04/10/2020   Lab Results  Component Value Date   HGBA1C 5.9 08/03/2021   Lab Results  Component Value Date   CHOL 208 (H) 08/03/2021   HDL 96.20 08/03/2021   LDLCALC 97 08/03/2021   TRIG 78.0 08/03/2021   CHOLHDL 2 08/03/2021    Significant Diagnostic Results in last 30 days:  CT PELVIS WO CONTRAST  Result Date: 08/16/2022 CLINICAL DATA:  89 year old female status post fall 10 days ago with continued pain to the right hip and groin. EXAM: CT PELVIS WITHOUT CONTRAST TECHNIQUE: Multidetector CT imaging of the pelvis was performed following the standard protocol without intravenous contrast. RADIATION DOSE REDUCTION: This exam was performed according to the departmental dose-optimization program which includes automated exposure control, adjustment of the mA and/or kV  according to patient size and/or use of iterative reconstruction technique. COMPARISON:  Pelvis CT on 08/06/2022. FINDINGS: Urinary Tract: Decompressed bladder with mild mass effect related to oval space-occupying mass in the left space of Retzius which is new (series 4, image 279 and series 8, image 25. Posttraumatic hematoma favored (see pelvis osseous findings below). Kidneys and ureters is *CRASH * that kidneys not included. No ureteral enlargement. Bowel: No dilated visible small and large bowel. Diverticulosis of the descending and sigmoid colon. No free air or free fluid. Vascular/Lymphatic: Extensive Aortoiliac calcified atherosclerosis. Normal caliber distal abdominal aorta. Vascular patency is not evaluated in the absence of IV contrast. No lymphadenopathy identified. Reproductive: Surgically absent uterus. Diminutive or absent ovaries. Other: No pelvis free fluid. Incidental numerous pelvic phleboliths. Musculoskeletal: Diffuse osteopenia. Comminuted but minimally displaced fracture of the right sacral ala is now apparent on series 3, images 26 through 42. SI joints remain symmetric. Left sacral ala appears intact. Visible L4 and L5 vertebrae appear grossly stable and intact. Bilateral iliac wings are intact. But there is severe comminution and displacement now of the medial right pubic rami, extending to the pubic symphysis. See the inferior ramus on series 3, image 108. And no displacement of the superior ramus relative to the symphysis on series 6 coronal image 28. Left pubic rami appear stable and intact. No acetabular involvement. Femoral heads remain normally located. Hip joint spaces are stable. Proximal femurs appears stable and intact. New left space of Retzius 3 x 5 by 2.5 cm oval mass (series 8, image 24) is most compatible with hematoma tracking away from the pubic symphysis in this clinical setting (volume estimated at 19 mL). No other pelvic sidewall hematoma. No discrete intramuscular  hematoma. IMPRESSION: 1. Highly comminuted and displaced fractures of the right pubic rami and pubic symphysis. Displacement (series 6, image 28) and associated left space of Retzius hematoma (19 mL). Comminuted, less displaced fracture  of the right sacral ala. Hips remain intact. Recommend Orthopedic consultation. 2. Underlying Osteopenia. 3.  Aortic Atherosclerosis (ICD10-I70.0). Electronically Signed   By: Odessa Fleming M.D.   On: 08/16/2022 09:17   CT PELVIS WO CONTRAST  Result Date: 08/06/2022 CLINICAL DATA:  Right hip pain. EXAM: CT PELVIS WITHOUT CONTRAST TECHNIQUE: Multidetector CT imaging of the pelvis was performed following the standard protocol without intravenous contrast. RADIATION DOSE REDUCTION: This exam was performed according to the departmental dose-optimization program which includes automated exposure control, adjustment of the mA and/or kV according to patient size and/or use of iterative reconstruction technique. COMPARISON:  Hip x-ray August 06, 2022 FINDINGS: Urinary Tract:  No abnormality visualized. Bowel:  Unremarkable visualized pelvic bowel loops. Vascular/Lymphatic: No pathologically enlarged lymph nodes. Atherosclerosis. Reproductive:  No mass or other significant abnormality Other:  None. Musculoskeletal: No fractures. No suspicious bone lesions identified. Moderate osteoarthritic changes of bilateral hips and lower lumbosacral spine. IMPRESSION: 1. No acute fracture or dislocation identified about the pelvis. 2. Moderate osteoarthritic changes of bilateral hips and lower lumbosacral spine. 3. Atherosclerosis. Electronically Signed   By: Ted Mcalpine M.D.   On: 08/06/2022 13:22   CT Head Wo Contrast  Result Date: 08/06/2022 CLINICAL DATA:  Head trauma, minor (Age >= 65y); Neck trauma (Age >= 65y) EXAM: CT HEAD WITHOUT CONTRAST CT CERVICAL SPINE WITHOUT CONTRAST TECHNIQUE: Multidetector CT imaging of the head and cervical spine was performed following the standard protocol  without intravenous contrast. Multiplanar CT image reconstructions of the cervical spine were also generated. RADIATION DOSE REDUCTION: This exam was performed according to the departmental dose-optimization program which includes automated exposure control, adjustment of the mA and/or kV according to patient size and/or use of iterative reconstruction technique. COMPARISON:  11/11/2019 FINDINGS: CT HEAD FINDINGS Brain: No evidence of acute infarction, hemorrhage, hydrocephalus, extra-axial collection or mass lesion/mass effect. Patchy low-density changes within the periventricular and subcortical white matter most compatible with chronic microvascular ischemic change. Moderate diffuse cerebral volume loss. Vascular: Atherosclerotic calcifications involving the large vessels of the skull base. No unexpected hyperdense vessel. Skull: Normal. Negative for fracture or focal lesion. Sinuses/Orbits: Mild mucosal thickening of the maxillary sinuses. No acute abnormality. Other: Negative for scalp hematoma. CT CERVICAL SPINE FINDINGS Alignment: Facet joints are aligned without dislocation or traumatic listhesis. Dens and lateral masses are aligned. Exaggerated cervical lordosis. Skull base and vertebrae: No acute fracture. No primary bone lesion or focal pathologic process. Soft tissues and spinal canal: No prevertebral fluid or swelling. No visible canal hematoma. Disc levels: Multilevel degenerative disc disease and facet arthropathy. Upper chest: Negative. Other: Bilateral carotid atherosclerosis. IMPRESSION: 1. No acute intracranial abnormality. 2. No acute fracture or subluxation of the cervical spine. Electronically Signed   By: Duanne Guess D.O.   On: 08/06/2022 12:17   CT Cervical Spine Wo Contrast  Result Date: 08/06/2022 CLINICAL DATA:  Head trauma, minor (Age >= 65y); Neck trauma (Age >= 65y) EXAM: CT HEAD WITHOUT CONTRAST CT CERVICAL SPINE WITHOUT CONTRAST TECHNIQUE: Multidetector CT imaging of the head  and cervical spine was performed following the standard protocol without intravenous contrast. Multiplanar CT image reconstructions of the cervical spine were also generated. RADIATION DOSE REDUCTION: This exam was performed according to the departmental dose-optimization program which includes automated exposure control, adjustment of the mA and/or kV according to patient size and/or use of iterative reconstruction technique. COMPARISON:  11/11/2019 FINDINGS: CT HEAD FINDINGS Brain: No evidence of acute infarction, hemorrhage, hydrocephalus, extra-axial collection or mass lesion/mass effect. Patchy  low-density changes within the periventricular and subcortical white matter most compatible with chronic microvascular ischemic change. Moderate diffuse cerebral volume loss. Vascular: Atherosclerotic calcifications involving the large vessels of the skull base. No unexpected hyperdense vessel. Skull: Normal. Negative for fracture or focal lesion. Sinuses/Orbits: Mild mucosal thickening of the maxillary sinuses. No acute abnormality. Other: Negative for scalp hematoma. CT CERVICAL SPINE FINDINGS Alignment: Facet joints are aligned without dislocation or traumatic listhesis. Dens and lateral masses are aligned. Exaggerated cervical lordosis. Skull base and vertebrae: No acute fracture. No primary bone lesion or focal pathologic process. Soft tissues and spinal canal: No prevertebral fluid or swelling. No visible canal hematoma. Disc levels: Multilevel degenerative disc disease and facet arthropathy. Upper chest: Negative. Other: Bilateral carotid atherosclerosis. IMPRESSION: 1. No acute intracranial abnormality. 2. No acute fracture or subluxation of the cervical spine. Electronically Signed   By: Duanne Guess D.O.   On: 08/06/2022 12:17   DG Hip Unilat With Pelvis 2-3 Views Right  Result Date: 08/06/2022 CLINICAL DATA:  Fall from chair.  Right-sided groin pain. EXAM: DG HIP (WITH OR WITHOUT PELVIS) 3V RIGHT  COMPARISON:  None Available. FINDINGS: Moderate degenerative changes are present at the right hip. Osteopenia noted. Vascular calcifications are present. Degenerative changes are present in the lower lumbar spine. IMPRESSION: 1. No acute fracture or dislocation. 2. Moderate degenerative changes at the right hip. Electronically Signed   By: Marin Roberts M.D.   On: 08/06/2022 12:11   DG Knee Complete 4 Views Right  Result Date: 08/06/2022 CLINICAL DATA:  Fall. EXAM: RIGHT KNEE - COMPLETE 4+ VIEW COMPARISON:  None Available. FINDINGS: Right knee is located without a fracture. Alignment is normal. Probable small suprapatellar joint effusion. Scattered osteoarthritis with osteophytosis. Evidence for chondrocalcinosis. IMPRESSION: 1. No acute bone abnormality to the right knee. 2. Mild-to-moderate osteoarthritis.  Probable small joint effusion. 3. Chondrocalcinosis. Electronically Signed   By: Richarda Overlie M.D.   On: 08/06/2022 12:10    Assessment/Plan Fever, unspecified fever cause  Hypoxia Patient with low grade fever that resolved without treatment. Persistent hypoxia. Ddx include pneumonia as well as PE given her recent fracture. Declines hospitalization, will plan to collect labs and treat pain/fever. Start Ceftriaxone 1g q24 hours x 3 days. CXR pending. SLP ordered to evaluate for signs of dysphagia. Attempted to contact [facility] and left voicemail.   Family/ staff Communication: nursing, VM with [facility].   Labs/tests ordered:  CBC, BMP, CXR   I spent greater than 45 minutes for the care of this patient in face to face time, chart review, clinical documentation, patient education.

## 2022-08-27 DIAGNOSIS — S32591D Other specified fracture of right pubis, subsequent encounter for fracture with routine healing: Secondary | ICD-10-CM | POA: Diagnosis not present

## 2022-08-29 DIAGNOSIS — S32591D Other specified fracture of right pubis, subsequent encounter for fracture with routine healing: Secondary | ICD-10-CM | POA: Diagnosis not present

## 2022-08-30 DIAGNOSIS — S32591D Other specified fracture of right pubis, subsequent encounter for fracture with routine healing: Secondary | ICD-10-CM | POA: Diagnosis not present

## 2022-08-31 DIAGNOSIS — S32591D Other specified fracture of right pubis, subsequent encounter for fracture with routine healing: Secondary | ICD-10-CM | POA: Diagnosis not present

## 2022-09-01 ENCOUNTER — Other Ambulatory Visit: Payer: Self-pay | Admitting: Nurse Practitioner

## 2022-09-01 DIAGNOSIS — S32591D Other specified fracture of right pubis, subsequent encounter for fracture with routine healing: Secondary | ICD-10-CM | POA: Diagnosis not present

## 2022-09-01 MED ORDER — CLONAZEPAM 0.5 MG PO TABS: 0.2500 mg | ORAL_TABLET | Freq: Two times a day (BID) | ORAL | 0 refills | Status: DC | PRN

## 2022-09-02 DIAGNOSIS — S32591D Other specified fracture of right pubis, subsequent encounter for fracture with routine healing: Secondary | ICD-10-CM | POA: Diagnosis not present

## 2022-09-05 DIAGNOSIS — S32591D Other specified fracture of right pubis, subsequent encounter for fracture with routine healing: Secondary | ICD-10-CM | POA: Diagnosis not present

## 2022-09-07 DIAGNOSIS — S32591D Other specified fracture of right pubis, subsequent encounter for fracture with routine healing: Secondary | ICD-10-CM | POA: Diagnosis not present

## 2022-09-09 DIAGNOSIS — S32591D Other specified fracture of right pubis, subsequent encounter for fracture with routine healing: Secondary | ICD-10-CM | POA: Diagnosis not present

## 2022-09-12 DIAGNOSIS — S32591D Other specified fracture of right pubis, subsequent encounter for fracture with routine healing: Secondary | ICD-10-CM | POA: Diagnosis not present

## 2022-09-13 DIAGNOSIS — S32591D Other specified fracture of right pubis, subsequent encounter for fracture with routine healing: Secondary | ICD-10-CM | POA: Diagnosis not present

## 2022-09-14 DIAGNOSIS — S32591D Other specified fracture of right pubis, subsequent encounter for fracture with routine healing: Secondary | ICD-10-CM | POA: Diagnosis not present

## 2022-09-14 DIAGNOSIS — S32810A Multiple fractures of pelvis with stable disruption of pelvic ring, initial encounter for closed fracture: Secondary | ICD-10-CM | POA: Diagnosis not present

## 2022-09-14 DIAGNOSIS — R102 Pelvic and perineal pain: Secondary | ICD-10-CM | POA: Diagnosis not present

## 2022-09-15 DIAGNOSIS — S32591D Other specified fracture of right pubis, subsequent encounter for fracture with routine healing: Secondary | ICD-10-CM | POA: Diagnosis not present

## 2022-09-16 DIAGNOSIS — S32591D Other specified fracture of right pubis, subsequent encounter for fracture with routine healing: Secondary | ICD-10-CM | POA: Diagnosis not present

## 2022-09-17 DIAGNOSIS — S32591D Other specified fracture of right pubis, subsequent encounter for fracture with routine healing: Secondary | ICD-10-CM | POA: Diagnosis not present

## 2022-09-19 DIAGNOSIS — S32591D Other specified fracture of right pubis, subsequent encounter for fracture with routine healing: Secondary | ICD-10-CM | POA: Diagnosis not present

## 2022-09-20 ENCOUNTER — Encounter: Payer: Self-pay | Admitting: Nurse Practitioner

## 2022-09-20 ENCOUNTER — Non-Acute Institutional Stay (SKILLED_NURSING_FACILITY): Payer: Medicare PPO | Admitting: Nurse Practitioner

## 2022-09-20 DIAGNOSIS — F411 Generalized anxiety disorder: Secondary | ICD-10-CM

## 2022-09-20 DIAGNOSIS — M810 Age-related osteoporosis without current pathological fracture: Secondary | ICD-10-CM | POA: Diagnosis not present

## 2022-09-20 DIAGNOSIS — F03A4 Unspecified dementia, mild, with anxiety: Secondary | ICD-10-CM

## 2022-09-20 DIAGNOSIS — I10 Essential (primary) hypertension: Secondary | ICD-10-CM

## 2022-09-20 DIAGNOSIS — F331 Major depressive disorder, recurrent, moderate: Secondary | ICD-10-CM | POA: Diagnosis not present

## 2022-09-20 DIAGNOSIS — R197 Diarrhea, unspecified: Secondary | ICD-10-CM | POA: Diagnosis not present

## 2022-09-20 DIAGNOSIS — S329XXD Fracture of unspecified parts of lumbosacral spine and pelvis, subsequent encounter for fracture with routine healing: Secondary | ICD-10-CM

## 2022-09-20 NOTE — Progress Notes (Signed)
Location:  Other [facility].  Nursing Home Room Number: [facility] 113A Place of Service:  SNF (31) Abbey Chatters, NP  PCP: Excell Seltzer, MD  Patient Care Team: Excell Seltzer, MD as PCP - General (Family Medicine) [name], MD as Consulting Physician (Ophthalmology)  Extended Emergency Contact Information Primary Emergency Contact: [facility] Address: [address] [place] [phone] [name] of [place] Home Phone: [phone] Mobile Phone: [phone] Relation: Son Secondary Emergency Contact: [name],Debbie          [place], [place] of [place] Mobile Phone: [phone] Relation: Daughter  Goals of care: Advanced Directive information    09/20/2022   10:51 AM  Advanced Directives  Does Patient Have a Medical Advance Directive? Yes  Type of Advance Directive Out of facility DNR (pink MOST or yellow form)     Chief Complaint  Patient presents with   Medical Management of Chronic Issues    Medical Management of Chronic Issues.     HPI:  Pt is a 89 y.o. female seen today for medical management of chronic disease.  Pt is healing well from her pelvic fracture. Pain well controlled on tylenol.  She reports she is sleeping well and mood is stable.  States she has had problems with diarrhea for months. No on any medication for bowels. Occasionally abdominal discomfort with diarrhea but not always.  No fevers or chills.     Past Medical History:  Diagnosis Date   Arthritis    Basal cell carcinoma of tragus, left    Cataract    right   Complication of anesthesia    COULD'T MOVE WHEN WAKING UP AFTER SECONT COLON SURGERY   Depression    Hypertension    Intestinal obstruction (HCC)    Osteoporosis    Past Surgical History:  Procedure Laterality Date   ABDOMINAL HYSTERECTOMY     partial, for menorrhagia   APPENDECTOMY     CATARACT EXTRACTION W/PHACO Right 05/25/2017   Procedure: CATARACT EXTRACTION PHACO AND  INTRAOCULAR LENS PLACEMENT (IOC);  Surgeon: [name], MD;  Location: [facility] ORS;  Service: Ophthalmology;  Laterality: Right;  fluid pack lot #  [id number] H  exp  12/24/2018 [place]   00:39.4 AP%   9.4 CDE    3.71    COLON SURGERY     INTESTINAL BLOCKAGE FROM SCAR TISSUE X 2   IR KYPHO LUMBAR INC FX REDUCE BONE BX UNI/BIL CANNULATION INC/IMAGING  11/30/2020    Allergies  Allergen Reactions   Fosamax [Alendronate] Diarrhea   Prednisone     Increased blood pressure and insomnia    Outpatient Encounter Medications as of 09/20/2022  Medication Sig   acetaminophen (TYLENOL) 500 MG tablet Take 1,000 mg by mouth 3 (three) times daily.   amLODipine (NORVASC) 5 MG tablet Take 1 tablet (5 mg total) by mouth daily.   celecoxib (CELEBREX) 200 MG capsule Take 200 mg by mouth 2 (two) times daily.   clonazePAM (KLONOPIN) 0.5 MG tablet Take 0.5 tablets (0.25 mg total) by mouth 2 (two) times daily as needed for anxiety.   donepezil (ARICEPT) 10 MG tablet Take 1 tablet (10 mg total) by mouth at bedtime.   ondansetron (ZOFRAN) 8 MG tablet Take 8 mg by mouth every 8 (eight) hours as needed for nausea or vomiting.   oxyCODONE-acetaminophen (PERCOCET) 7.5-325 MG tablet Take 1 tablet by mouth every 8 (eight) hours as needed for severe pain.  OXYGEN 2-3lpm every 24 hours as needed.   polyethylene glycol (MIRALAX / GLYCOLAX) 17 g packet Take 17 g by mouth daily as needed.   sertraline (ZOLOFT) 100 MG tablet TAKE 1 TABLET(100 MG) BY MOUTH AT BEDTIME   traZODone (DESYREL) 100 MG tablet TAKE 1 TABLET(50 MG) BY MOUTH AT BEDTIME AS NEEDED FOR SLEEP   Vitamin D, Ergocalciferol, (DRISDOL) 1.25 MG (50000 UNIT) CAPS capsule Take 1 capsule (50,000 Units total) by mouth every 7 (seven) days.   Zinc Oxide (TRIPLE PASTE) 12.8 % ointment Apply 1 Application topically. Every shift.   [DISCONTINUED] cholecalciferol (CHOLECALCIFEROL) 25 MCG tablet Take 1 tablet (1,000 Units total) by mouth daily.   No facility-administered  encounter medications on file as of 09/20/2022.    Review of Systems  Constitutional:  Negative for activity change, appetite change, fatigue and unexpected weight change.  HENT:  Negative for congestion and hearing loss.   Eyes: Negative.   Respiratory:  Negative for cough and shortness of breath.   Cardiovascular:  Negative for chest pain, palpitations and leg swelling.  Gastrointestinal:  Positive for diarrhea. Negative for abdominal pain and constipation.  Genitourinary:  Negative for difficulty urinating and dysuria.  Musculoskeletal:  Negative for arthralgias and myalgias.  Skin:  Negative for color change and wound.  Neurological:  Negative for dizziness and weakness.  Psychiatric/Behavioral:  Negative for agitation, behavioral problems and confusion.      Immunization History  Administered Date(s) Administered   Fluad Quad(high Dose 65+) 10/23/2018, 10/25/2019   Influenza,inj,quad, With Preservative 11/10/2016   Influenza-Unspecified 11/17/2016, 10/24/2017   Moderna Covid-19 Vaccine Bivalent Booster 69yrs & up 08/05/2020, 12/03/2021   Moderna Sars-Covid-2 Vaccination 02/08/2019, 05/14/2019, 11/13/2019, 12/06/2019, 10/15/2020   PFIZER(Purple Top)SARS-COV-2 Vaccination 08/05/2020   Pneumococcal Conjugate-13 10/25/2011, 11/22/2016   Pneumococcal Polysaccharide-23 12/13/2017   Pertinent  Health Maintenance Due  Topic Date Due   INFLUENZA VACCINE  08/25/2022   DEXA SCAN  Completed      03/23/2021    2:57 PM 06/22/2021   11:05 AM 04/19/2022   11:28 AM 06/29/2022    9:31 AM 07/25/2022    3:13 PM  Fall Risk  Falls in the past year? 0 0 1 0 1  Was there an injury with Fall? 0 0 0 0 0  Fall Risk Category Calculator 0 0 2 0 1  Fall Risk Category (Retired) Low Low     (RETIRED) Patient Fall Risk Level Low fall risk Low fall risk     Patient at Risk for Falls Due to No Fall Risks Medication side effect History of fall(s);Impaired mobility No Fall Risks No Fall Risks  Fall risk  Follow up Falls evaluation completed Falls evaluation completed;Education provided;Falls prevention discussed Falls evaluation completed Falls evaluation completed Falls prevention discussed   Functional Status Survey:    Vitals:   09/20/22 1037 09/20/22 1115  BP: (!) 176/73 110/62  Pulse: 61   Resp: 18   Temp: 98.1 F (36.7 C)   SpO2: 94%   Weight: 103 lb (46.7 kg)   Height: 5\' 4"  (1.626 m)    Body mass index is 17.68 kg/m. Physical Exam Constitutional:      General: She is not in acute distress.    Appearance: She is well-developed. She is not diaphoretic.  HENT:     Head: Normocephalic and atraumatic.     Mouth/Throat:     Pharynx: No oropharyngeal exudate.  Eyes:     Conjunctiva/sclera: Conjunctivae normal.     Pupils: Pupils are equal,  round, and reactive to light.  Cardiovascular:     Rate and Rhythm: Normal rate and regular rhythm.     Heart sounds: Normal heart sounds.  Pulmonary:     Effort: Pulmonary effort is normal.     Breath sounds: Normal breath sounds.  Abdominal:     General: Bowel sounds are normal.     Palpations: Abdomen is soft.  Musculoskeletal:     Cervical back: Normal range of motion and neck supple.     Right lower leg: No edema.     Left lower leg: No edema.  Skin:    General: Skin is warm and dry.  Neurological:     Mental Status: She is alert.  Psychiatric:        Mood and Affect: Mood normal.     Labs reviewed: Recent Labs    07/26/22 1201 08/16/22 0934 08/17/22 0401 08/26/22 0000  NA 136 136 134* 130*  K 4.4 3.8 3.8 4.3  CL 97 101 104 93*  CO2 30 28 27  29*  GLUCOSE 93 89 90  --   BUN 17 12 15 21   CREATININE 0.93 0.74 0.85 1.1  CALCIUM 9.6 8.6* 8.0* 8.3*   Recent Labs    08/17/22 0401  AST 16  ALT 10  ALKPHOS 66  BILITOT 0.5  PROT 5.9*  ALBUMIN 3.0*   Recent Labs    08/16/22 0934 08/17/22 0401 08/26/22 0000  WBC 7.2 6.2 12.4  NEUTROABS 4.6  --   --   HGB 10.5* 10.3* 10.2*  HCT 32.8* 32.3* 31*  MCV  100.0 100.6*  --   PLT 389 402* 335   Lab Results  Component Value Date   TSH 1.43 04/10/2020   Lab Results  Component Value Date   HGBA1C 5.9 08/03/2021   Lab Results  Component Value Date   CHOL 208 (H) 08/03/2021   HDL 96.20 08/03/2021   LDLCALC 97 08/03/2021   TRIG 78.0 08/03/2021   CHOLHDL 2 08/03/2021    Significant Diagnostic Results in last 30 days:  No results found.  Assessment/Plan 1. Closed displaced fracture of pelvis, unspecified part of pelvis, on 08/16/22 (HCC) Healing well. Pain controlled on tylenol will dc oxycodone-apap has she has not used in over a week  2. GAD (generalized anxiety disorder) -stable at this time.   3. Mild dementia with anxiety, unspecified dementia type (HCC) Continues on aricept   4. Moderate recurrent major depression (HCC) Stable on zoloft 100 mg daily  5. Age-related osteoporosis with current pathological fracture -continues on Vit d and will add calcium 600 mg BID   6. Primary hypertension -Blood pressure well controlled, goal bp <140/90 Continue current medications and dietary modifications follow metabolic panel  7. Diarrhea, unspecified type Will add benefiber daily Dc miralax from orders  8. Hyponatremia Follow up cmp  9 anemia Follow up cbc    Constance Hackenberg K. Biagio Borg [facility] [phone]

## 2022-09-21 DIAGNOSIS — S32591D Other specified fracture of right pubis, subsequent encounter for fracture with routine healing: Secondary | ICD-10-CM | POA: Diagnosis not present

## 2022-09-22 DIAGNOSIS — E871 Hypo-osmolality and hyponatremia: Secondary | ICD-10-CM | POA: Diagnosis not present

## 2022-09-22 DIAGNOSIS — D649 Anemia, unspecified: Secondary | ICD-10-CM | POA: Diagnosis not present

## 2022-09-22 LAB — COMPREHENSIVE METABOLIC PANEL
Albumin: 3.5 (ref 3.5–5.0)
Calcium: 8.7 (ref 8.7–10.7)
Globulin: 2.7
eGFR: 60

## 2022-09-22 LAB — BASIC METABOLIC PANEL
BUN: 18 (ref 4–21)
CO2: 31 — AB (ref 13–22)
Chloride: 102 (ref 99–108)
Creatinine: 0.9 (ref 0.5–1.1)
Glucose: 80
Potassium: 4.4 mEq/L (ref 3.5–5.1)
Sodium: 138 (ref 137–147)

## 2022-09-22 LAB — CBC AND DIFFERENTIAL
HCT: 32 — AB (ref 36–46)
Hemoglobin: 10.5 — AB (ref 12.0–16.0)
Neutrophils Absolute: 1572
Platelets: 274 10*3/uL (ref 150–400)
WBC: 3.9

## 2022-09-22 LAB — HEPATIC FUNCTION PANEL
ALT: 10 U/L (ref 7–35)
AST: 15 (ref 13–35)
Alkaline Phosphatase: 184 — AB (ref 25–125)
Bilirubin, Total: 0.4

## 2022-09-22 LAB — CBC: RBC: 3.31 — AB (ref 3.87–5.11)

## 2022-09-23 ENCOUNTER — Other Ambulatory Visit: Payer: Self-pay | Admitting: Family

## 2022-09-23 DIAGNOSIS — S32591D Other specified fracture of right pubis, subsequent encounter for fracture with routine healing: Secondary | ICD-10-CM | POA: Diagnosis not present

## 2022-09-23 DIAGNOSIS — F411 Generalized anxiety disorder: Secondary | ICD-10-CM

## 2022-09-26 DIAGNOSIS — S32591D Other specified fracture of right pubis, subsequent encounter for fracture with routine healing: Secondary | ICD-10-CM | POA: Diagnosis not present

## 2022-09-27 ENCOUNTER — Other Ambulatory Visit: Payer: Self-pay | Admitting: Nurse Practitioner

## 2022-09-27 MED ORDER — CLONAZEPAM 0.5 MG PO TABS: 0.2500 mg | ORAL_TABLET | Freq: Two times a day (BID) | ORAL | 0 refills | Status: DC | PRN

## 2022-09-28 DIAGNOSIS — S32591D Other specified fracture of right pubis, subsequent encounter for fracture with routine healing: Secondary | ICD-10-CM | POA: Diagnosis not present

## 2022-09-29 DIAGNOSIS — S32591D Other specified fracture of right pubis, subsequent encounter for fracture with routine healing: Secondary | ICD-10-CM | POA: Diagnosis not present

## 2022-09-29 DIAGNOSIS — E871 Hypo-osmolality and hyponatremia: Secondary | ICD-10-CM | POA: Diagnosis not present

## 2022-09-29 LAB — BASIC METABOLIC PANEL
BUN: 12 (ref 4–21)
CO2: 34 — AB (ref 13–22)
Chloride: 99 (ref 99–108)
Creatinine: 0.9 (ref 0.5–1.1)
Glucose: 86
Potassium: 4.2 mEq/L (ref 3.5–5.1)
Sodium: 140 (ref 137–147)

## 2022-09-29 LAB — COMPREHENSIVE METABOLIC PANEL
Calcium: 9.5 (ref 8.7–10.7)
eGFR: 66

## 2022-10-03 DIAGNOSIS — S32591D Other specified fracture of right pubis, subsequent encounter for fracture with routine healing: Secondary | ICD-10-CM | POA: Diagnosis not present

## 2022-10-04 DIAGNOSIS — S32591D Other specified fracture of right pubis, subsequent encounter for fracture with routine healing: Secondary | ICD-10-CM | POA: Diagnosis not present

## 2022-10-05 DIAGNOSIS — S32591D Other specified fracture of right pubis, subsequent encounter for fracture with routine healing: Secondary | ICD-10-CM | POA: Diagnosis not present

## 2022-10-06 DIAGNOSIS — S32591D Other specified fracture of right pubis, subsequent encounter for fracture with routine healing: Secondary | ICD-10-CM | POA: Diagnosis not present

## 2022-10-07 DIAGNOSIS — S32591D Other specified fracture of right pubis, subsequent encounter for fracture with routine healing: Secondary | ICD-10-CM | POA: Diagnosis not present

## 2022-10-11 DIAGNOSIS — S32591D Other specified fracture of right pubis, subsequent encounter for fracture with routine healing: Secondary | ICD-10-CM | POA: Diagnosis not present

## 2022-10-12 DIAGNOSIS — S32591D Other specified fracture of right pubis, subsequent encounter for fracture with routine healing: Secondary | ICD-10-CM | POA: Diagnosis not present

## 2022-10-13 ENCOUNTER — Encounter: Payer: Self-pay | Admitting: Nurse Practitioner

## 2022-10-13 ENCOUNTER — Non-Acute Institutional Stay (SKILLED_NURSING_FACILITY): Payer: Medicare PPO | Admitting: Nurse Practitioner

## 2022-10-13 DIAGNOSIS — M48061 Spinal stenosis, lumbar region without neurogenic claudication: Secondary | ICD-10-CM | POA: Diagnosis not present

## 2022-10-13 DIAGNOSIS — M5416 Radiculopathy, lumbar region: Secondary | ICD-10-CM

## 2022-10-13 DIAGNOSIS — S32591D Other specified fracture of right pubis, subsequent encounter for fracture with routine healing: Secondary | ICD-10-CM | POA: Diagnosis not present

## 2022-10-13 MED ORDER — GABAPENTIN 100 MG PO CAPS
100.0000 mg | ORAL_CAPSULE | Freq: Three times a day (TID) | ORAL | Status: DC | PRN
Start: 2022-10-13 — End: 2022-12-28

## 2022-10-13 NOTE — Progress Notes (Signed)
Location:  Other [facility].  Nursing Home Room Number: [facility] 113A Place of Service:  SNF (31) Catherine [place]  PCP: Excell Seltzer, MD  Patient Care Team: Excell Seltzer, MD as PCP - General (Family Medicine) Catherine Crane, MD as Consulting Physician (Ophthalmology)  Extended Emergency Contact Information Primary Emergency Contact: [facility] Address: [address] [place] [phone] [place] of [place] Home Phone: [phone] Mobile Phone: [phone] Relation: Son Secondary Emergency Contact: [place],Catherine          [place], [place] Mobile Phone: [phone] Relation: Daughter  Goals of care: Advanced Directive information    10/13/2022   11:23 AM  Advanced Directives  Does Patient Have a Medical Advance Directive? Yes  Type of Advance Directive Out of facility DNR (pink MOST or yellow form)  Does patient want to make changes to medical advance directive? No - Patient declined     Chief Complaint  Patient presents with   Acute Visit    Pain in Legs.     HPI:  Pt is a 89 y.o. female seen today for an acute visit for pain in legs She reports she had neuropathy in her feet and now has gone to leg.  Reports legs are numb and tingling  Pain goes and comes- did not have pain a few days ago but now 7/10 pain. Which has been ongoing over the last 2 days.  Goes from buttocks to thighs down leg Reports pain in hips had improved now having more groin pain on the right.  Denies back pain but reports she has spinal stenosis  She did take injections from [facility] but they stopped helping so she quit taking.  No trouble walking - walking multiple times a days She has loss of bladder control which has been ongoing- no recent changes to bowel or bladder  Tylenol not effective   Past Medical History:  Diagnosis Date   Arthritis    Basal cell carcinoma of tragus, left    Cataract    right   Complication of  anesthesia    COULD'T MOVE WHEN WAKING UP AFTER SECONT COLON SURGERY   Depression    Hypertension    Intestinal obstruction (HCC)    Osteoporosis    Past Surgical History:  Procedure Laterality Date   ABDOMINAL HYSTERECTOMY     partial, for menorrhagia   APPENDECTOMY     CATARACT EXTRACTION W/PHACO Right 05/25/2017   Procedure: CATARACT EXTRACTION PHACO AND INTRAOCULAR LENS PLACEMENT (IOC);  Surgeon: Catherine Crane, MD;  Location: [facility] ORS;  Service: Ophthalmology;  Laterality: Right;  fluid pack lot #  [id number] H  exp  12/24/2018 [place]   00:39.4 AP%   9.4 CDE    3.71    COLON SURGERY     INTESTINAL BLOCKAGE FROM SCAR TISSUE X 2   IR KYPHO LUMBAR INC FX REDUCE BONE BX UNI/BIL CANNULATION INC/IMAGING  11/30/2020    Allergies  Allergen Reactions   Fosamax [Alendronate] Diarrhea   Prednisone     Increased blood pressure and insomnia    Outpatient Encounter Medications as of 10/13/2022  Medication Sig   acetaminophen (TYLENOL) 500 MG tablet Take 1,000 mg by mouth 3 (three) times daily.   amLODipine (NORVASC) 5 MG tablet Take 1 tablet (5 mg total) by mouth daily.   clonazePAM (KLONOPIN) 0.5 MG tablet Take 0.5 tablets (0.25 mg total) by mouth 2 (two) times daily  as needed for anxiety.   donepezil (ARICEPT) 10 MG tablet Take 1 tablet (10 mg total) by mouth at bedtime.   gabapentin (NEURONTIN) 100 MG capsule Take 1 capsule (100 mg total) by mouth 3 (three) times daily as needed.   ondansetron (ZOFRAN) 8 MG tablet Take 8 mg by mouth every 8 (eight) hours as needed for nausea or vomiting.   OXYGEN 2-3lpm every 24 hours as needed.   polyethylene glycol (MIRALAX / GLYCOLAX) 17 g packet Take 17 g by mouth daily as needed.   sertraline (ZOLOFT) 100 MG tablet TAKE 1 TABLET(100 MG) BY MOUTH AT BEDTIME   traZODone (DESYREL) 100 MG tablet TAKE 1 TABLET(50 MG) BY MOUTH AT BEDTIME AS NEEDED FOR SLEEP   Vitamin D, Ergocalciferol, (DRISDOL) 1.25 MG (50000 UNIT) CAPS capsule Take 1 capsule (50,000  Units total) by mouth every 7 (seven) days.   Wheat Dextrin (BENEFIBER PO) Take 1 Scoop by mouth daily.   Zinc Oxide (TRIPLE PASTE) 12.8 % ointment Apply 1 Application topically. Every shift.   [DISCONTINUED] celecoxib (CELEBREX) 200 MG capsule Take 200 mg by mouth 2 (two) times daily.   No facility-administered encounter medications on file as of 10/13/2022.    Review of Systems  Constitutional:  Negative for activity change, appetite change, fatigue and unexpected weight change.  HENT:  Negative for congestion and hearing loss.   Eyes: Negative.   Respiratory:  Negative for cough and shortness of breath.   Cardiovascular:  Negative for chest pain, palpitations and leg swelling.  Gastrointestinal:  Negative for abdominal pain, constipation and diarrhea.  Genitourinary:  Negative for difficulty urinating and dysuria.  Musculoskeletal:  Negative for arthralgias and myalgias.  Skin:  Negative for color change and wound.  Neurological:  Positive for weakness and numbness. Negative for dizziness.  Psychiatric/Behavioral:  Negative for agitation, behavioral problems and confusion.     Immunization History  Administered Date(s) Administered   Fluad Quad(high Dose 65+) 10/23/2018, 10/25/2019   Influenza,inj,quad, With Preservative 11/10/2016   Influenza-Unspecified 11/17/2016, 10/24/2017   Moderna Covid-19 Vaccine Bivalent Booster 15yrs & up 08/05/2020, 12/03/2021   Moderna Sars-Covid-2 Vaccination 02/08/2019, 05/14/2019, 11/13/2019, 12/06/2019, 10/15/2020   PFIZER(Purple Top)SARS-COV-2 Vaccination 08/05/2020   PNEUMOCOCCAL CONJUGATE-20 08/31/2022   Pneumococcal Conjugate-13 10/25/2011, 11/22/2016   Pneumococcal Polysaccharide-23 12/13/2017   Pertinent  Health Maintenance Due  Topic Date Due   INFLUENZA VACCINE  08/25/2022   DEXA SCAN  Completed      03/23/2021    2:57 PM 06/22/2021   11:05 AM 04/19/2022   11:28 AM 06/29/2022    9:31 AM 07/25/2022    3:13 PM  Fall Risk  Falls in the  past year? 0 0 1 0 1  Was there an injury with Fall? 0 0 0 0 0  Fall Risk Category Calculator 0 0 2 0 1  Fall Risk Category (Retired) Low Low     (RETIRED) Patient Fall Risk Level Low fall risk Low fall risk     Patient at Risk for Falls Due to No Fall Risks Medication side effect History of fall(s);Impaired mobility No Fall Risks No Fall Risks  Fall risk Follow up Falls evaluation completed Falls evaluation completed;Education provided;Falls prevention discussed Falls evaluation completed Falls evaluation completed Falls prevention discussed   Functional Status Survey:    Vitals:   10/13/22 1117  BP: 126/63  Pulse: 60  Resp: 18  Temp: 97.7 F (36.5 C)  SpO2: 94%  Weight: 109 lb 6.4 oz (49.6 kg)  Height: 5\' 4"  (1.626  m)   Body mass index is 18.78 kg/m. Physical Exam Constitutional:      General: She is not in acute distress.    Appearance: She is well-developed. She is not diaphoretic.  HENT:     Head: Normocephalic and atraumatic.     Mouth/Throat:     Pharynx: No oropharyngeal exudate.  Eyes:     Conjunctiva/sclera: Conjunctivae normal.     Pupils: Pupils are equal, round, and reactive to light.  Cardiovascular:     Rate and Rhythm: Normal rate and regular rhythm.     Heart sounds: Normal heart sounds.  Pulmonary:     Effort: Pulmonary effort is normal.     Breath sounds: Normal breath sounds.  Abdominal:     General: Bowel sounds are normal.     Palpations: Abdomen is soft.  Musculoskeletal:     Cervical back: Normal range of motion and neck supple.     Right lower leg: No edema.     Left lower leg: No edema.  Skin:    General: Skin is warm and dry.  Neurological:     Mental Status: She is alert. Mental status is at baseline.     Motor: Weakness present.     Gait: Gait abnormal.  Psychiatric:        Mood and Affect: Mood normal.     Labs reviewed: Recent Labs    07/26/22 1201 08/16/22 0934 08/17/22 0401 08/26/22 0000 09/22/22 0000 09/29/22 0000   NA 136 136 134* 130* 138 140  K 4.4 3.8 3.8 4.3 4.4 4.2  CL 97 101 104 93* 102 99  CO2 30 28 27  29* 31* 34*  GLUCOSE 93 89 90  --   --   --   BUN 17 12 15 21 18 12   CREATININE 0.93 0.74 0.85 1.1 0.9 0.9  CALCIUM 9.6 8.6* 8.0* 8.3* 8.7 9.5   Recent Labs    08/17/22 0401 09/22/22 0000  AST 16 15  ALT 10 10  ALKPHOS 66 184*  BILITOT 0.5  --   PROT 5.9*  --   ALBUMIN 3.0* 3.5   Recent Labs    08/16/22 0934 08/17/22 0401 08/26/22 0000 09/22/22 0000  WBC 7.2 6.2 12.4 3.9  NEUTROABS 4.6  --   --  1,572.00  HGB 10.5* 10.3* 10.2* 10.5*  HCT 32.8* 32.3* 31* 32*  MCV 100.0 100.6*  --   --   PLT 389 402* 335 274   Lab Results  Component Value Date   TSH 1.43 04/10/2020   Lab Results  Component Value Date   HGBA1C 5.9 08/03/2021   Lab Results  Component Value Date   CHOL 208 (H) 08/03/2021   HDL 96.20 08/03/2021   LDLCALC 97 08/03/2021   TRIG 78.0 08/03/2021   CHOLHDL 2 08/03/2021    Significant Diagnostic Results in last 30 days:  No results found.  Assessment/Plan 1. Spinal stenosis of lumbar region with radiculopathy No pain in back but worsening pain in legs.  She is still working with PT, feels weak but new.  Will start gabapentin 100 mg TID PRN pain Continues with therapy  Monitor  and report for worsening or progressive symptoms.    Janene Harvey. [facility]agio Borg [facility] [phone]

## 2022-10-14 DIAGNOSIS — S32591D Other specified fracture of right pubis, subsequent encounter for fracture with routine healing: Secondary | ICD-10-CM | POA: Diagnosis not present

## 2022-10-17 DIAGNOSIS — S32591D Other specified fracture of right pubis, subsequent encounter for fracture with routine healing: Secondary | ICD-10-CM | POA: Diagnosis not present

## 2022-10-20 ENCOUNTER — Other Ambulatory Visit: Payer: Self-pay | Admitting: Family Medicine

## 2022-10-21 NOTE — Telephone Encounter (Signed)
I do not see a reason this medication was specifically DC'd.  Per recent Ortho notes it is on her medication list at amlodipine 5 mg p.o. daily.  Please call the patient to determine if she is continuing to take this and what dose?

## 2022-10-21 NOTE — Telephone Encounter (Signed)
Do not see where patient has seen you after it was d/c. Ok to refuse refill?

## 2022-10-24 NOTE — Telephone Encounter (Signed)
Called and spoke with patient, she states right now she is unsure of what she is taking. She is placed at a facility that gives her medication to take. She advised not to send any refills of this as her meds are going through [facility]. She will ask the facility for a list of what she is taking and let us know.

## 2022-10-25 ENCOUNTER — Ambulatory Visit: Payer: Medicare PPO | Admitting: Family Medicine

## 2022-10-25 NOTE — Telephone Encounter (Signed)
PT returned call to the office, requested a call back when able.

## 2022-10-26 DIAGNOSIS — S32591D Other specified fracture of right pubis, subsequent encounter for fracture with routine healing: Secondary | ICD-10-CM | POA: Diagnosis not present

## 2022-10-26 DIAGNOSIS — Z741 Need for assistance with personal care: Secondary | ICD-10-CM | POA: Diagnosis not present

## 2022-10-26 DIAGNOSIS — F0393 Unspecified dementia, unspecified severity, with mood disturbance: Secondary | ICD-10-CM | POA: Diagnosis not present

## 2022-10-26 DIAGNOSIS — R278 Other lack of coordination: Secondary | ICD-10-CM | POA: Diagnosis not present

## 2022-10-26 DIAGNOSIS — E559 Vitamin D deficiency, unspecified: Secondary | ICD-10-CM | POA: Diagnosis not present

## 2022-10-26 DIAGNOSIS — I10 Essential (primary) hypertension: Secondary | ICD-10-CM | POA: Diagnosis not present

## 2022-10-26 DIAGNOSIS — R4189 Other symptoms and signs involving cognitive functions and awareness: Secondary | ICD-10-CM | POA: Diagnosis not present

## 2022-10-26 DIAGNOSIS — M810 Age-related osteoporosis without current pathological fracture: Secondary | ICD-10-CM | POA: Diagnosis not present

## 2022-10-26 DIAGNOSIS — S3210XD Unspecified fracture of sacrum, subsequent encounter for fracture with routine healing: Secondary | ICD-10-CM | POA: Diagnosis not present

## 2022-10-27 DIAGNOSIS — R4189 Other symptoms and signs involving cognitive functions and awareness: Secondary | ICD-10-CM | POA: Diagnosis not present

## 2022-10-27 DIAGNOSIS — S32591D Other specified fracture of right pubis, subsequent encounter for fracture with routine healing: Secondary | ICD-10-CM | POA: Diagnosis not present

## 2022-10-27 DIAGNOSIS — M810 Age-related osteoporosis without current pathological fracture: Secondary | ICD-10-CM | POA: Diagnosis not present

## 2022-10-27 DIAGNOSIS — Z741 Need for assistance with personal care: Secondary | ICD-10-CM | POA: Diagnosis not present

## 2022-10-27 DIAGNOSIS — E559 Vitamin D deficiency, unspecified: Secondary | ICD-10-CM | POA: Diagnosis not present

## 2022-10-27 DIAGNOSIS — I10 Essential (primary) hypertension: Secondary | ICD-10-CM | POA: Diagnosis not present

## 2022-10-27 DIAGNOSIS — S3210XD Unspecified fracture of sacrum, subsequent encounter for fracture with routine healing: Secondary | ICD-10-CM | POA: Diagnosis not present

## 2022-10-27 DIAGNOSIS — F0393 Unspecified dementia, unspecified severity, with mood disturbance: Secondary | ICD-10-CM | POA: Diagnosis not present

## 2022-10-27 DIAGNOSIS — R278 Other lack of coordination: Secondary | ICD-10-CM | POA: Diagnosis not present

## 2022-10-29 ENCOUNTER — Other Ambulatory Visit: Payer: Self-pay | Admitting: Family Medicine

## 2022-10-31 ENCOUNTER — Encounter: Payer: Self-pay | Admitting: Student

## 2022-10-31 ENCOUNTER — Non-Acute Institutional Stay: Payer: Medicare PPO | Admitting: Student

## 2022-10-31 DIAGNOSIS — E559 Vitamin D deficiency, unspecified: Secondary | ICD-10-CM | POA: Diagnosis not present

## 2022-10-31 DIAGNOSIS — G5702 Lesion of sciatic nerve, left lower limb: Secondary | ICD-10-CM | POA: Diagnosis not present

## 2022-10-31 DIAGNOSIS — F03A4 Unspecified dementia, mild, with anxiety: Secondary | ICD-10-CM

## 2022-10-31 DIAGNOSIS — M810 Age-related osteoporosis without current pathological fracture: Secondary | ICD-10-CM | POA: Diagnosis not present

## 2022-10-31 DIAGNOSIS — S32591D Other specified fracture of right pubis, subsequent encounter for fracture with routine healing: Secondary | ICD-10-CM | POA: Diagnosis not present

## 2022-10-31 DIAGNOSIS — M48061 Spinal stenosis, lumbar region without neurogenic claudication: Secondary | ICD-10-CM

## 2022-10-31 DIAGNOSIS — I10 Essential (primary) hypertension: Secondary | ICD-10-CM | POA: Diagnosis not present

## 2022-10-31 DIAGNOSIS — R3981 Functional urinary incontinence: Secondary | ICD-10-CM | POA: Diagnosis not present

## 2022-10-31 DIAGNOSIS — S3210XD Unspecified fracture of sacrum, subsequent encounter for fracture with routine healing: Secondary | ICD-10-CM | POA: Diagnosis not present

## 2022-10-31 DIAGNOSIS — Z741 Need for assistance with personal care: Secondary | ICD-10-CM | POA: Diagnosis not present

## 2022-10-31 DIAGNOSIS — R278 Other lack of coordination: Secondary | ICD-10-CM | POA: Diagnosis not present

## 2022-10-31 DIAGNOSIS — S329XXD Fracture of unspecified parts of lumbosacral spine and pelvis, subsequent encounter for fracture with routine healing: Secondary | ICD-10-CM | POA: Diagnosis not present

## 2022-10-31 DIAGNOSIS — F0393 Unspecified dementia, unspecified severity, with mood disturbance: Secondary | ICD-10-CM | POA: Diagnosis not present

## 2022-10-31 DIAGNOSIS — Z79899 Other long term (current) drug therapy: Secondary | ICD-10-CM | POA: Diagnosis not present

## 2022-10-31 DIAGNOSIS — F331 Major depressive disorder, recurrent, moderate: Secondary | ICD-10-CM | POA: Diagnosis not present

## 2022-10-31 DIAGNOSIS — R4189 Other symptoms and signs involving cognitive functions and awareness: Secondary | ICD-10-CM | POA: Diagnosis not present

## 2022-10-31 MED ORDER — TRAMADOL HCL 50 MG PO TABS
50.0000 mg | ORAL_TABLET | Freq: Two times a day (BID) | ORAL | 0 refills | Status: AC
Start: 2022-10-31 — End: 2022-11-30

## 2022-10-31 NOTE — Progress Notes (Signed)
Provider:  Dr Earnestine Mealing Location:  Other [place].  Nursing Home Room Number: Virl Son [id number] Place of Service:  ALF (13)  PCP: Earnestine Mealing, MD Patient Care Team: Earnestine Mealing, MD as PCP - General (Family Medicine) [name], MD as Consulting Physician (Ophthalmology)  Extended Emergency Contact Information Primary Emergency Contact: [facility] Address: [address] [place] [phone] [name] Home Phone: [phone] Mobile Phone: [phone] Relation: Son Secondary Emergency Contact: [name],Debbie          [place], [place] Mobile Phone: [phone] Relation: Daughter  Code Status: DNR Goals of Care: Advanced Directive information    10/13/2022   11:23 AM  Advanced Directives  Does Patient Have a Medical Advance Directive? Yes  Type of Advance Directive Out of facility DNR (pink MOST or yellow form)  Does patient want to make changes to medical advance directive? No - Patient declined      Chief Complaint  Patient presents with   Admission    Admission.     HPI: Catherine Young is a 89 y.o. female seen today for admission to [facility].   Patient transition to assisted living from skilled nursing after a fall where she had nonsurgical pelvic fractures.  Fracture date of injury on 08/16/2022.  Patient continues to have left gluteal pain.  No improvement with heating pads or scheduled Tylenol.  She is done some stretches with physical therapy however does not have significant improvement at this time.  She has also had some issues with urination since her fracture.  She said initially had urinary incontinence and stool incontinence after the fall.  Stool is now manageable, however she is not making it to the restroom in time.  She is eating well.  Progressing with therapy well.  Having normal bowel movements.   Denies nausea, vomiting, chest pain, shortness of breath.  She is sleeping  well.  Past Medical History:  Diagnosis Date   Arthritis    Basal cell carcinoma of tragus, left    Cataract    right   Complication of anesthesia    COULD'T MOVE WHEN WAKING UP AFTER SECONT COLON SURGERY   Depression    Hypertension    Intestinal obstruction (HCC)    Osteoporosis    Past Surgical History:  Procedure Laterality Date   ABDOMINAL HYSTERECTOMY     partial, for menorrhagia   APPENDECTOMY     CATARACT EXTRACTION W/PHACO Right 05/25/2017   Procedure: CATARACT EXTRACTION PHACO AND INTRAOCULAR LENS PLACEMENT (IOC);  Surgeon: [name], MD;  Location: [facility] ORS;  Service: Ophthalmology;  Laterality: Right;  fluid pack lot #  [id number]  exp  12/24/2018 [place]   00:39.4 AP%   9.4 CDE    3.71    COLON SURGERY     INTESTINAL BLOCKAGE FROM SCAR TISSUE X 2   IR KYPHO LUMBAR INC FX REDUCE BONE BX UNI/BIL CANNULATION INC/IMAGING  11/30/2020    reports that she quit smoking about 29 years ago. Her smoking use included cigarettes. She has never used smokeless tobacco. She reports current alcohol use of about 7.0 standard drinks of alcohol per week. She reports that she does not use drugs. Social History   Socioeconomic History   Marital status: Widowed    Spouse name: Not on file   Number of children: Not on file   Years of education: Not on file   Highest education level:  Not on file  Occupational History   Not on file  Tobacco Use   Smoking status: Former    Current packs/day: 0.00    Types: Cigarettes    Quit date: 01/24/1993    Years since quitting: 29.7   Smokeless tobacco: Never   Tobacco comments:    quit 1995  Vaping Use   Vaping status: Never Used  Substance and Sexual Activity   Alcohol use: Yes    Alcohol/week: 7.0 standard drinks of alcohol    Types: 7 Glasses of wine per week    Comment: nightly glass of wine   Drug use: No   Sexual activity: Never  Other Topics Concern   Not on file  Social History Narrative   Not on file   Social Determinants  of Health   Financial Resource Strain: Low Risk  (07/25/2022)   Overall Financial Resource Strain (CARDIA)    Difficulty of Paying Living Expenses: Not hard at all  Food Insecurity: No Food Insecurity (08/16/2022)   Hunger Vital Sign    Worried About Running Out of Food in the Last Year: Never true    Ran Out of Food in the Last Year: Never true  Transportation Needs: No Transportation Needs (08/16/2022)   PRAPARE - Administrator, Civil Service (Medical): No    Lack of Transportation (Non-Medical): No  Physical Activity: Insufficiently Active (07/25/2022)   Exercise Vital Sign    Days of Exercise per Week: 1 day    Minutes of Exercise per Session: 40 min  Stress: No Stress Concern Present (07/25/2022)   [employer] of Occupational Health - Occupational Stress Questionnaire    Feeling of Stress : Not at all  Social Connections: Socially Isolated (07/25/2022)   Social Connection and Isolation Panel [NHANES]    Frequency of Communication with Friends and Family: More than three times a week    Frequency of Social Gatherings with Friends and Family: More than three times a week    Attends Religious Services: Never    Database administrator or Organizations: No    Attends Banker Meetings: Never    Marital Status: Widowed  Intimate Partner Violence: Not At Risk (08/16/2022)   Humiliation, Afraid, Rape, and Kick questionnaire    Fear of Current or Ex-Partner: No    Emotionally Abused: No    Physically Abused: No    Sexually Abused: No    Functional Status Survey:    Family History  Problem Relation Age of Onset   Arthritis Mother    Colon cancer Mother    Cancer Mother        colon   Diabetes Father    Arthritis Sister    Colon cancer Sister    Cancer Sister        colon   Lung cancer Brother    Cancer Brother        lung   Arthritis Sister    Colon cancer Sister    Cancer Sister        colon    Health Maintenance  Topic Date Due    DTaP/Tdap/Td (1 - Tdap) Never done   Zoster Vaccines- Shingrix (1 of 2) Never done   INFLUENZA VACCINE  08/25/2022   COVID-19 Vaccine (9 - 2023-24 season) 12/16/2022   Medicare Annual Wellness (AWV)  07/25/2023   Pneumonia Vaccine 44+ Years old  Completed   DEXA SCAN  Completed   HPV VACCINES  Aged Out  Allergies  Allergen Reactions   Fosamax [Alendronate] Diarrhea   Prednisone     Increased blood pressure and insomnia    Outpatient Encounter Medications as of 10/31/2022  Medication Sig   acetaminophen (TYLENOL) 500 MG tablet Take 1,000 mg by mouth 3 (three) times daily.   amLODipine (NORVASC) 5 MG tablet Take 1 tablet (5 mg total) by mouth daily.   cetirizine (ZYRTEC) 5 MG tablet Take 5 mg by mouth daily as needed for allergies.   clonazePAM (KLONOPIN) 0.5 MG tablet Take 0.5 tablets (0.25 mg total) by mouth 2 (two) times daily as needed for anxiety.   donepezil (ARICEPT) 10 MG tablet Take 1 tablet (10 mg total) by mouth at bedtime.   gabapentin (NEURONTIN) 100 MG capsule Take 1 capsule (100 mg total) by mouth 3 (three) times daily as needed.   nystatin (MYCOSTATIN/NYSTOP) powder Apply 1 Application topically 2 (two) times daily.   ondansetron (ZOFRAN) 8 MG tablet Take 8 mg by mouth every 8 (eight) hours as needed for nausea or vomiting.   OXYGEN 2-3lpm every 24 hours as needed.   polyethylene glycol (MIRALAX / GLYCOLAX) 17 g packet Take 17 g by mouth daily as needed.   sertraline (ZOLOFT) 100 MG tablet TAKE 1 TABLET(100 MG) BY MOUTH AT BEDTIME   traZODone (DESYREL) 100 MG tablet TAKE 1 TABLET(50 MG) BY MOUTH AT BEDTIME AS NEEDED FOR SLEEP   Vitamin D, Ergocalciferol, (DRISDOL) 1.25 MG (50000 UNIT) CAPS capsule Take 1 capsule (50,000 Units total) by mouth every 7 (seven) days.   Wheat Dextrin (BENEFIBER PO) Take 1 Scoop by mouth daily.   Zinc Oxide (TRIPLE PASTE) 12.8 % ointment Apply 1 Application topically. Every shift.   No facility-administered encounter medications on file as  of 10/31/2022.    Review of Systems  Vitals:   10/31/22 1622  BP: (!) 164/58  Pulse: 61  Resp: 17  Temp: (!) 96.4 F (35.8 C)  SpO2: 94%  Weight: 110 lb 6.4 oz (50.1 kg)  Height: 5\' 4"  (1.626 m)   Body mass index is 18.95 kg/m. Physical Exam Constitutional:      Comments: Thin, frail  Cardiovascular:     Rate and Rhythm: Normal rate and regular rhythm.     Pulses: Normal pulses.     Heart sounds: Normal heart sounds.  Pulmonary:     Effort: Pulmonary effort is normal.     Breath sounds: Normal breath sounds.  Abdominal:     General: Abdomen is flat.     Palpations: Abdomen is soft.  Musculoskeletal:     Comments: Nontender at the sacrum left gluteal tenderness to palpation with radiation down the leg.  Nontender on the right gluteus maximus.  Skin:    General: Skin is warm and dry.  Neurological:     Mental Status: She is alert and oriented to person, place, and time.     Labs reviewed: Basic Metabolic Panel: Recent Labs    07/26/22 1201 08/16/22 0934 08/17/22 0401 08/26/22 0000 09/22/22 0000 09/29/22 0000  NA 136 136 134* 130* 138 140  K 4.4 3.8 3.8 4.3 4.4 4.2  CL 97 101 104 93* 102 99  CO2 30 28 27  29* 31* 34*  GLUCOSE 93 89 90  --   --   --   BUN 17 12 15 21 18 12   CREATININE 0.93 0.74 0.85 1.1 0.9 0.9  CALCIUM 9.6 8.6* 8.0* 8.3* 8.7 9.5   Liver Function Tests: Recent Labs    08/17/22 0401  09/22/22 0000  AST 16 15  ALT 10 10  ALKPHOS 66 184*  BILITOT 0.5  --   PROT 5.9*  --   ALBUMIN 3.0* 3.5   No results for input(s): "LIPASE", "AMYLASE" in the last 8760 hours. No results for input(s): "AMMONIA" in the last 8760 hours. CBC: Recent Labs    08/16/22 0934 08/17/22 0401 08/26/22 0000 09/22/22 0000  WBC 7.2 6.2 12.4 3.9  NEUTROABS 4.6  --   --  1,572.00  HGB 10.5* 10.3* 10.2* 10.5*  HCT 32.8* 32.3* 31* 32*  MCV 100.0 100.6*  --   --   PLT 389 402* 335 274   Cardiac Enzymes: No results for input(s): "CKTOTAL", "CKMB", "CKMBINDEX",  "TROPONINI" in the last 8760 hours. BNP: Invalid input(s): "POCBNP" Lab Results  Component Value Date   HGBA1C 5.9 08/03/2021   Lab Results  Component Value Date   TSH 1.43 04/10/2020   Lab Results  Component Value Date   VITAMINB12 346 04/10/2020   No results found for: "FOLATE" No results found for: "IRON", "TIBC", "FERRITIN"  Imaging and Procedures obtained prior to SNF admission: CT PELVIS WO CONTRAST  Result Date: 08/16/2022 CLINICAL DATA:  89 year old female status post fall 10 days ago with continued pain to the right hip and groin. EXAM: CT PELVIS WITHOUT CONTRAST TECHNIQUE: Multidetector CT imaging of the pelvis was performed following the standard protocol without intravenous contrast. RADIATION DOSE REDUCTION: This exam was performed according to the departmental dose-optimization program which includes automated exposure control, adjustment of the mA and/or kV according to patient size and/or use of iterative reconstruction technique. COMPARISON:  Pelvis CT on 08/06/2022. FINDINGS: Urinary Tract: Decompressed bladder with mild mass effect related to oval space-occupying mass in the left space of Retzius which is new (series 4, image 279 and series 8, image 25. Posttraumatic hematoma favored (see pelvis osseous findings below). Kidneys and ureters is *CRASH * that kidneys not included. No ureteral enlargement. Bowel: No dilated visible small and large bowel. Diverticulosis of the descending and sigmoid colon. No free air or free fluid. Vascular/Lymphatic: Extensive Aortoiliac calcified atherosclerosis. Normal caliber distal abdominal aorta. Vascular patency is not evaluated in the absence of IV contrast. No lymphadenopathy identified. Reproductive: Surgically absent uterus. Diminutive or absent ovaries. Other: No pelvis free fluid. Incidental numerous pelvic phleboliths. Musculoskeletal: Diffuse osteopenia. Comminuted but minimally displaced fracture of the right sacral ala is now  apparent on series 3, images 26 through 42. SI joints remain symmetric. Left sacral ala appears intact. Visible L4 and L5 vertebrae appear grossly stable and intact. Bilateral iliac wings are intact. But there is severe comminution and displacement now of the medial right pubic rami, extending to the pubic symphysis. See the inferior ramus on series 3, image 108. And no displacement of the superior ramus relative to the symphysis on series 6 coronal image 28. Left pubic rami appear stable and intact. No acetabular involvement. Femoral heads remain normally located. Hip joint spaces are stable. Proximal femurs appears stable and intact. New left space of Retzius 3 x 5 by 2.5 cm oval mass (series 8, image 24) is most compatible with hematoma tracking away from the pubic symphysis in this clinical setting (volume estimated at 19 mL). No other pelvic sidewall hematoma. No discrete intramuscular hematoma. IMPRESSION: 1. Highly comminuted and displaced fractures of the right pubic rami and pubic symphysis. Displacement (series 6, image 28) and associated left space of Retzius hematoma (19 mL). Comminuted, less displaced fracture of the right sacral ala.  Hips remain intact. Recommend Orthopedic consultation. 2. Underlying Osteopenia. 3.  Aortic Atherosclerosis (ICD10-I70.0). Electronically Signed   By: Odessa Fleming M.D.   On: 08/16/2022 09:17    Assessment/Plan 1. Piriformis syndrome of left side Patient with left gluteal pain and tenderness to palpation.  Discussed possible overcompensation and tightening of the gluteal muscles with recent exercise in the setting of pelvic fracture.  Will start tramadol 50 mg twice daily as needed for pain control.  Continue Tylenol 3 times daily scheduled for pain control.  Encourage heating pads.  Will provide stretching exercises to help with pain control.  Continue gabapentin 100 3 times daily as needed.  2. Moderate recurrent major depression (HCC) Mood well-controlled with Zoloft  100 mg daily.  3. Mild dementia with anxiety, unspecified dementia type [facility]) Patient now in assisted living.  She is alert and oriented at the time.  She is independent with ambulation using a walker.  She is functional urinary incontinence, is continent of stool.  She ambulates to and from breakfast and transfers independently.  She is at a fast level 4 at this time. Continue supportive care. Continue donepezil.   4. Spinal stenosis of lumbar region with radiculopathy Patient with longstanding lower back pain.  Unclear if it is contributing to her current symptoms of left gluteal pain.  Will continue therapy at this time and if at follow-up no improvement consider reimaging or evaluation with therapy.  Will need to discuss whether or not patient would want to have any future surgeries or interventions for this pain.  5. Closed displaced fracture of pelvis with routine healing, unspecified part of pelvis, subsequent encounter Fracture occurred 08/16/2022 nonsurgical in nature.  Continue physical therapy  6. Primary hypertension BP elevated today likely secondary to pain, continue to monitor blood pressures.  Continue Norvasc 5 mg daily.  7. Chronic prescription benzodiazepine use Patient with long-term use of clonazepam for anxiety and aid with sleep.  Concern for increased risk of fall, worsening memory due to this medication.  Continue slow gradual weaning.  Currently on 0.25 mg twice daily as needed.  Will assess frequency of use and consider discontinuation in the near future.  8. Functional incontinence  Relatively new after recent hip fracture.  Discussed Kegel exercises.  Will provide information for this.  Encourage patient to ask physical therapy for additional support with this concern.  Family/ staff Communication: nursing  Labs/tests ordered: none    I spent greater than 40 minutes for the care of this patient in face to face time, chart review, clinical documentation, patient  education.

## 2022-11-01 DIAGNOSIS — Z741 Need for assistance with personal care: Secondary | ICD-10-CM | POA: Diagnosis not present

## 2022-11-01 DIAGNOSIS — S3210XD Unspecified fracture of sacrum, subsequent encounter for fracture with routine healing: Secondary | ICD-10-CM | POA: Diagnosis not present

## 2022-11-01 DIAGNOSIS — M810 Age-related osteoporosis without current pathological fracture: Secondary | ICD-10-CM | POA: Diagnosis not present

## 2022-11-01 DIAGNOSIS — R4189 Other symptoms and signs involving cognitive functions and awareness: Secondary | ICD-10-CM | POA: Diagnosis not present

## 2022-11-01 DIAGNOSIS — E559 Vitamin D deficiency, unspecified: Secondary | ICD-10-CM | POA: Diagnosis not present

## 2022-11-01 DIAGNOSIS — R278 Other lack of coordination: Secondary | ICD-10-CM | POA: Diagnosis not present

## 2022-11-01 DIAGNOSIS — S32591D Other specified fracture of right pubis, subsequent encounter for fracture with routine healing: Secondary | ICD-10-CM | POA: Diagnosis not present

## 2022-11-01 DIAGNOSIS — I10 Essential (primary) hypertension: Secondary | ICD-10-CM | POA: Diagnosis not present

## 2022-11-01 DIAGNOSIS — F0393 Unspecified dementia, unspecified severity, with mood disturbance: Secondary | ICD-10-CM | POA: Diagnosis not present

## 2022-11-02 DIAGNOSIS — Z741 Need for assistance with personal care: Secondary | ICD-10-CM | POA: Diagnosis not present

## 2022-11-02 DIAGNOSIS — S3210XD Unspecified fracture of sacrum, subsequent encounter for fracture with routine healing: Secondary | ICD-10-CM | POA: Diagnosis not present

## 2022-11-02 DIAGNOSIS — F0393 Unspecified dementia, unspecified severity, with mood disturbance: Secondary | ICD-10-CM | POA: Diagnosis not present

## 2022-11-02 DIAGNOSIS — S32591D Other specified fracture of right pubis, subsequent encounter for fracture with routine healing: Secondary | ICD-10-CM | POA: Diagnosis not present

## 2022-11-02 DIAGNOSIS — M810 Age-related osteoporosis without current pathological fracture: Secondary | ICD-10-CM | POA: Diagnosis not present

## 2022-11-02 DIAGNOSIS — E559 Vitamin D deficiency, unspecified: Secondary | ICD-10-CM | POA: Diagnosis not present

## 2022-11-02 DIAGNOSIS — R278 Other lack of coordination: Secondary | ICD-10-CM | POA: Diagnosis not present

## 2022-11-02 DIAGNOSIS — R4189 Other symptoms and signs involving cognitive functions and awareness: Secondary | ICD-10-CM | POA: Diagnosis not present

## 2022-11-02 DIAGNOSIS — I10 Essential (primary) hypertension: Secondary | ICD-10-CM | POA: Diagnosis not present

## 2022-11-03 DIAGNOSIS — I10 Essential (primary) hypertension: Secondary | ICD-10-CM | POA: Diagnosis not present

## 2022-11-03 DIAGNOSIS — S3210XD Unspecified fracture of sacrum, subsequent encounter for fracture with routine healing: Secondary | ICD-10-CM | POA: Diagnosis not present

## 2022-11-03 DIAGNOSIS — R278 Other lack of coordination: Secondary | ICD-10-CM | POA: Diagnosis not present

## 2022-11-03 DIAGNOSIS — S32591D Other specified fracture of right pubis, subsequent encounter for fracture with routine healing: Secondary | ICD-10-CM | POA: Diagnosis not present

## 2022-11-03 DIAGNOSIS — F0393 Unspecified dementia, unspecified severity, with mood disturbance: Secondary | ICD-10-CM | POA: Diagnosis not present

## 2022-11-03 DIAGNOSIS — E559 Vitamin D deficiency, unspecified: Secondary | ICD-10-CM | POA: Diagnosis not present

## 2022-11-03 DIAGNOSIS — M810 Age-related osteoporosis without current pathological fracture: Secondary | ICD-10-CM | POA: Diagnosis not present

## 2022-11-03 DIAGNOSIS — R4189 Other symptoms and signs involving cognitive functions and awareness: Secondary | ICD-10-CM | POA: Diagnosis not present

## 2022-11-03 DIAGNOSIS — Z741 Need for assistance with personal care: Secondary | ICD-10-CM | POA: Diagnosis not present

## 2022-11-07 DIAGNOSIS — F0393 Unspecified dementia, unspecified severity, with mood disturbance: Secondary | ICD-10-CM | POA: Diagnosis not present

## 2022-11-07 DIAGNOSIS — I10 Essential (primary) hypertension: Secondary | ICD-10-CM | POA: Diagnosis not present

## 2022-11-07 DIAGNOSIS — M810 Age-related osteoporosis without current pathological fracture: Secondary | ICD-10-CM | POA: Diagnosis not present

## 2022-11-07 DIAGNOSIS — S3210XD Unspecified fracture of sacrum, subsequent encounter for fracture with routine healing: Secondary | ICD-10-CM | POA: Diagnosis not present

## 2022-11-07 DIAGNOSIS — S32591D Other specified fracture of right pubis, subsequent encounter for fracture with routine healing: Secondary | ICD-10-CM | POA: Diagnosis not present

## 2022-11-07 DIAGNOSIS — R278 Other lack of coordination: Secondary | ICD-10-CM | POA: Diagnosis not present

## 2022-11-07 DIAGNOSIS — Z741 Need for assistance with personal care: Secondary | ICD-10-CM | POA: Diagnosis not present

## 2022-11-07 DIAGNOSIS — E559 Vitamin D deficiency, unspecified: Secondary | ICD-10-CM | POA: Diagnosis not present

## 2022-11-07 DIAGNOSIS — R4189 Other symptoms and signs involving cognitive functions and awareness: Secondary | ICD-10-CM | POA: Diagnosis not present

## 2022-11-08 DIAGNOSIS — R4189 Other symptoms and signs involving cognitive functions and awareness: Secondary | ICD-10-CM | POA: Diagnosis not present

## 2022-11-08 DIAGNOSIS — R278 Other lack of coordination: Secondary | ICD-10-CM | POA: Diagnosis not present

## 2022-11-08 DIAGNOSIS — F0393 Unspecified dementia, unspecified severity, with mood disturbance: Secondary | ICD-10-CM | POA: Diagnosis not present

## 2022-11-08 DIAGNOSIS — M810 Age-related osteoporosis without current pathological fracture: Secondary | ICD-10-CM | POA: Diagnosis not present

## 2022-11-08 DIAGNOSIS — Z741 Need for assistance with personal care: Secondary | ICD-10-CM | POA: Diagnosis not present

## 2022-11-08 DIAGNOSIS — E559 Vitamin D deficiency, unspecified: Secondary | ICD-10-CM | POA: Diagnosis not present

## 2022-11-08 DIAGNOSIS — S32591D Other specified fracture of right pubis, subsequent encounter for fracture with routine healing: Secondary | ICD-10-CM | POA: Diagnosis not present

## 2022-11-08 DIAGNOSIS — I10 Essential (primary) hypertension: Secondary | ICD-10-CM | POA: Diagnosis not present

## 2022-11-08 DIAGNOSIS — S3210XD Unspecified fracture of sacrum, subsequent encounter for fracture with routine healing: Secondary | ICD-10-CM | POA: Diagnosis not present

## 2022-11-09 DIAGNOSIS — S3210XD Unspecified fracture of sacrum, subsequent encounter for fracture with routine healing: Secondary | ICD-10-CM | POA: Diagnosis not present

## 2022-11-09 DIAGNOSIS — M810 Age-related osteoporosis without current pathological fracture: Secondary | ICD-10-CM | POA: Diagnosis not present

## 2022-11-09 DIAGNOSIS — R278 Other lack of coordination: Secondary | ICD-10-CM | POA: Diagnosis not present

## 2022-11-09 DIAGNOSIS — Z741 Need for assistance with personal care: Secondary | ICD-10-CM | POA: Diagnosis not present

## 2022-11-09 DIAGNOSIS — R4189 Other symptoms and signs involving cognitive functions and awareness: Secondary | ICD-10-CM | POA: Diagnosis not present

## 2022-11-09 DIAGNOSIS — E559 Vitamin D deficiency, unspecified: Secondary | ICD-10-CM | POA: Diagnosis not present

## 2022-11-09 DIAGNOSIS — S32591D Other specified fracture of right pubis, subsequent encounter for fracture with routine healing: Secondary | ICD-10-CM | POA: Diagnosis not present

## 2022-11-09 DIAGNOSIS — F0393 Unspecified dementia, unspecified severity, with mood disturbance: Secondary | ICD-10-CM | POA: Diagnosis not present

## 2022-11-09 DIAGNOSIS — I10 Essential (primary) hypertension: Secondary | ICD-10-CM | POA: Diagnosis not present

## 2022-11-10 DIAGNOSIS — S3210XD Unspecified fracture of sacrum, subsequent encounter for fracture with routine healing: Secondary | ICD-10-CM | POA: Diagnosis not present

## 2022-11-10 DIAGNOSIS — S32591D Other specified fracture of right pubis, subsequent encounter for fracture with routine healing: Secondary | ICD-10-CM | POA: Diagnosis not present

## 2022-11-10 DIAGNOSIS — F0393 Unspecified dementia, unspecified severity, with mood disturbance: Secondary | ICD-10-CM | POA: Diagnosis not present

## 2022-11-10 DIAGNOSIS — M810 Age-related osteoporosis without current pathological fracture: Secondary | ICD-10-CM | POA: Diagnosis not present

## 2022-11-10 DIAGNOSIS — I10 Essential (primary) hypertension: Secondary | ICD-10-CM | POA: Diagnosis not present

## 2022-11-10 DIAGNOSIS — R278 Other lack of coordination: Secondary | ICD-10-CM | POA: Diagnosis not present

## 2022-11-10 DIAGNOSIS — R4189 Other symptoms and signs involving cognitive functions and awareness: Secondary | ICD-10-CM | POA: Diagnosis not present

## 2022-11-10 DIAGNOSIS — Z741 Need for assistance with personal care: Secondary | ICD-10-CM | POA: Diagnosis not present

## 2022-11-10 DIAGNOSIS — E559 Vitamin D deficiency, unspecified: Secondary | ICD-10-CM | POA: Diagnosis not present

## 2022-11-15 DIAGNOSIS — F0393 Unspecified dementia, unspecified severity, with mood disturbance: Secondary | ICD-10-CM | POA: Diagnosis not present

## 2022-11-15 DIAGNOSIS — E559 Vitamin D deficiency, unspecified: Secondary | ICD-10-CM | POA: Diagnosis not present

## 2022-11-15 DIAGNOSIS — S3210XD Unspecified fracture of sacrum, subsequent encounter for fracture with routine healing: Secondary | ICD-10-CM | POA: Diagnosis not present

## 2022-11-15 DIAGNOSIS — Z741 Need for assistance with personal care: Secondary | ICD-10-CM | POA: Diagnosis not present

## 2022-11-15 DIAGNOSIS — S32591D Other specified fracture of right pubis, subsequent encounter for fracture with routine healing: Secondary | ICD-10-CM | POA: Diagnosis not present

## 2022-11-15 DIAGNOSIS — M810 Age-related osteoporosis without current pathological fracture: Secondary | ICD-10-CM | POA: Diagnosis not present

## 2022-11-15 DIAGNOSIS — I10 Essential (primary) hypertension: Secondary | ICD-10-CM | POA: Diagnosis not present

## 2022-11-15 DIAGNOSIS — R4189 Other symptoms and signs involving cognitive functions and awareness: Secondary | ICD-10-CM | POA: Diagnosis not present

## 2022-11-15 DIAGNOSIS — R278 Other lack of coordination: Secondary | ICD-10-CM | POA: Diagnosis not present

## 2022-11-16 DIAGNOSIS — R278 Other lack of coordination: Secondary | ICD-10-CM | POA: Diagnosis not present

## 2022-11-16 DIAGNOSIS — S3210XD Unspecified fracture of sacrum, subsequent encounter for fracture with routine healing: Secondary | ICD-10-CM | POA: Diagnosis not present

## 2022-11-16 DIAGNOSIS — E559 Vitamin D deficiency, unspecified: Secondary | ICD-10-CM | POA: Diagnosis not present

## 2022-11-16 DIAGNOSIS — Z741 Need for assistance with personal care: Secondary | ICD-10-CM | POA: Diagnosis not present

## 2022-11-16 DIAGNOSIS — I10 Essential (primary) hypertension: Secondary | ICD-10-CM | POA: Diagnosis not present

## 2022-11-16 DIAGNOSIS — S32591D Other specified fracture of right pubis, subsequent encounter for fracture with routine healing: Secondary | ICD-10-CM | POA: Diagnosis not present

## 2022-11-16 DIAGNOSIS — F0393 Unspecified dementia, unspecified severity, with mood disturbance: Secondary | ICD-10-CM | POA: Diagnosis not present

## 2022-11-16 DIAGNOSIS — M810 Age-related osteoporosis without current pathological fracture: Secondary | ICD-10-CM | POA: Diagnosis not present

## 2022-11-16 DIAGNOSIS — R4189 Other symptoms and signs involving cognitive functions and awareness: Secondary | ICD-10-CM | POA: Diagnosis not present

## 2022-11-21 DIAGNOSIS — R278 Other lack of coordination: Secondary | ICD-10-CM | POA: Diagnosis not present

## 2022-11-21 DIAGNOSIS — M810 Age-related osteoporosis without current pathological fracture: Secondary | ICD-10-CM | POA: Diagnosis not present

## 2022-11-21 DIAGNOSIS — S32591D Other specified fracture of right pubis, subsequent encounter for fracture with routine healing: Secondary | ICD-10-CM | POA: Diagnosis not present

## 2022-11-21 DIAGNOSIS — I10 Essential (primary) hypertension: Secondary | ICD-10-CM | POA: Diagnosis not present

## 2022-11-21 DIAGNOSIS — F0393 Unspecified dementia, unspecified severity, with mood disturbance: Secondary | ICD-10-CM | POA: Diagnosis not present

## 2022-11-21 DIAGNOSIS — E559 Vitamin D deficiency, unspecified: Secondary | ICD-10-CM | POA: Diagnosis not present

## 2022-11-21 DIAGNOSIS — R4189 Other symptoms and signs involving cognitive functions and awareness: Secondary | ICD-10-CM | POA: Diagnosis not present

## 2022-11-21 DIAGNOSIS — S3210XD Unspecified fracture of sacrum, subsequent encounter for fracture with routine healing: Secondary | ICD-10-CM | POA: Diagnosis not present

## 2022-11-21 DIAGNOSIS — Z741 Need for assistance with personal care: Secondary | ICD-10-CM | POA: Diagnosis not present

## 2022-11-22 DIAGNOSIS — F0393 Unspecified dementia, unspecified severity, with mood disturbance: Secondary | ICD-10-CM | POA: Diagnosis not present

## 2022-11-22 DIAGNOSIS — R4189 Other symptoms and signs involving cognitive functions and awareness: Secondary | ICD-10-CM | POA: Diagnosis not present

## 2022-11-22 DIAGNOSIS — S3210XD Unspecified fracture of sacrum, subsequent encounter for fracture with routine healing: Secondary | ICD-10-CM | POA: Diagnosis not present

## 2022-11-22 DIAGNOSIS — I10 Essential (primary) hypertension: Secondary | ICD-10-CM | POA: Diagnosis not present

## 2022-11-22 DIAGNOSIS — E559 Vitamin D deficiency, unspecified: Secondary | ICD-10-CM | POA: Diagnosis not present

## 2022-11-22 DIAGNOSIS — Z741 Need for assistance with personal care: Secondary | ICD-10-CM | POA: Diagnosis not present

## 2022-11-22 DIAGNOSIS — M810 Age-related osteoporosis without current pathological fracture: Secondary | ICD-10-CM | POA: Diagnosis not present

## 2022-11-22 DIAGNOSIS — S32591D Other specified fracture of right pubis, subsequent encounter for fracture with routine healing: Secondary | ICD-10-CM | POA: Diagnosis not present

## 2022-11-22 DIAGNOSIS — R278 Other lack of coordination: Secondary | ICD-10-CM | POA: Diagnosis not present

## 2022-11-23 DIAGNOSIS — S3210XD Unspecified fracture of sacrum, subsequent encounter for fracture with routine healing: Secondary | ICD-10-CM | POA: Diagnosis not present

## 2022-11-23 DIAGNOSIS — M810 Age-related osteoporosis without current pathological fracture: Secondary | ICD-10-CM | POA: Diagnosis not present

## 2022-11-23 DIAGNOSIS — F0393 Unspecified dementia, unspecified severity, with mood disturbance: Secondary | ICD-10-CM | POA: Diagnosis not present

## 2022-11-23 DIAGNOSIS — I10 Essential (primary) hypertension: Secondary | ICD-10-CM | POA: Diagnosis not present

## 2022-11-23 DIAGNOSIS — R4189 Other symptoms and signs involving cognitive functions and awareness: Secondary | ICD-10-CM | POA: Diagnosis not present

## 2022-11-23 DIAGNOSIS — R278 Other lack of coordination: Secondary | ICD-10-CM | POA: Diagnosis not present

## 2022-11-23 DIAGNOSIS — S32591D Other specified fracture of right pubis, subsequent encounter for fracture with routine healing: Secondary | ICD-10-CM | POA: Diagnosis not present

## 2022-11-23 DIAGNOSIS — Z741 Need for assistance with personal care: Secondary | ICD-10-CM | POA: Diagnosis not present

## 2022-11-23 DIAGNOSIS — E559 Vitamin D deficiency, unspecified: Secondary | ICD-10-CM | POA: Diagnosis not present

## 2022-11-24 DIAGNOSIS — R278 Other lack of coordination: Secondary | ICD-10-CM | POA: Diagnosis not present

## 2022-11-24 DIAGNOSIS — F0393 Unspecified dementia, unspecified severity, with mood disturbance: Secondary | ICD-10-CM | POA: Diagnosis not present

## 2022-11-24 DIAGNOSIS — E559 Vitamin D deficiency, unspecified: Secondary | ICD-10-CM | POA: Diagnosis not present

## 2022-11-24 DIAGNOSIS — Z741 Need for assistance with personal care: Secondary | ICD-10-CM | POA: Diagnosis not present

## 2022-11-24 DIAGNOSIS — M810 Age-related osteoporosis without current pathological fracture: Secondary | ICD-10-CM | POA: Diagnosis not present

## 2022-11-24 DIAGNOSIS — S32591D Other specified fracture of right pubis, subsequent encounter for fracture with routine healing: Secondary | ICD-10-CM | POA: Diagnosis not present

## 2022-11-24 DIAGNOSIS — I10 Essential (primary) hypertension: Secondary | ICD-10-CM | POA: Diagnosis not present

## 2022-11-24 DIAGNOSIS — R4189 Other symptoms and signs involving cognitive functions and awareness: Secondary | ICD-10-CM | POA: Diagnosis not present

## 2022-11-24 DIAGNOSIS — S3210XD Unspecified fracture of sacrum, subsequent encounter for fracture with routine healing: Secondary | ICD-10-CM | POA: Diagnosis not present

## 2022-11-28 DIAGNOSIS — R278 Other lack of coordination: Secondary | ICD-10-CM | POA: Diagnosis not present

## 2022-11-28 DIAGNOSIS — F0393 Unspecified dementia, unspecified severity, with mood disturbance: Secondary | ICD-10-CM | POA: Diagnosis not present

## 2022-11-28 DIAGNOSIS — Z741 Need for assistance with personal care: Secondary | ICD-10-CM | POA: Diagnosis not present

## 2022-11-28 DIAGNOSIS — E559 Vitamin D deficiency, unspecified: Secondary | ICD-10-CM | POA: Diagnosis not present

## 2022-11-28 DIAGNOSIS — M810 Age-related osteoporosis without current pathological fracture: Secondary | ICD-10-CM | POA: Diagnosis not present

## 2022-11-28 DIAGNOSIS — R4189 Other symptoms and signs involving cognitive functions and awareness: Secondary | ICD-10-CM | POA: Diagnosis not present

## 2022-11-28 DIAGNOSIS — I10 Essential (primary) hypertension: Secondary | ICD-10-CM | POA: Diagnosis not present

## 2022-11-28 DIAGNOSIS — S32591D Other specified fracture of right pubis, subsequent encounter for fracture with routine healing: Secondary | ICD-10-CM | POA: Diagnosis not present

## 2022-11-28 DIAGNOSIS — S3210XD Unspecified fracture of sacrum, subsequent encounter for fracture with routine healing: Secondary | ICD-10-CM | POA: Diagnosis not present

## 2022-11-29 DIAGNOSIS — S3210XD Unspecified fracture of sacrum, subsequent encounter for fracture with routine healing: Secondary | ICD-10-CM | POA: Diagnosis not present

## 2022-11-29 DIAGNOSIS — S32591D Other specified fracture of right pubis, subsequent encounter for fracture with routine healing: Secondary | ICD-10-CM | POA: Diagnosis not present

## 2022-11-29 DIAGNOSIS — Z741 Need for assistance with personal care: Secondary | ICD-10-CM | POA: Diagnosis not present

## 2022-11-29 DIAGNOSIS — F0393 Unspecified dementia, unspecified severity, with mood disturbance: Secondary | ICD-10-CM | POA: Diagnosis not present

## 2022-11-29 DIAGNOSIS — E559 Vitamin D deficiency, unspecified: Secondary | ICD-10-CM | POA: Diagnosis not present

## 2022-11-29 DIAGNOSIS — I10 Essential (primary) hypertension: Secondary | ICD-10-CM | POA: Diagnosis not present

## 2022-11-29 DIAGNOSIS — R4189 Other symptoms and signs involving cognitive functions and awareness: Secondary | ICD-10-CM | POA: Diagnosis not present

## 2022-11-29 DIAGNOSIS — M810 Age-related osteoporosis without current pathological fracture: Secondary | ICD-10-CM | POA: Diagnosis not present

## 2022-11-29 DIAGNOSIS — R278 Other lack of coordination: Secondary | ICD-10-CM | POA: Diagnosis not present

## 2022-11-30 DIAGNOSIS — Z741 Need for assistance with personal care: Secondary | ICD-10-CM | POA: Diagnosis not present

## 2022-11-30 DIAGNOSIS — I10 Essential (primary) hypertension: Secondary | ICD-10-CM | POA: Diagnosis not present

## 2022-11-30 DIAGNOSIS — S3210XD Unspecified fracture of sacrum, subsequent encounter for fracture with routine healing: Secondary | ICD-10-CM | POA: Diagnosis not present

## 2022-11-30 DIAGNOSIS — S32591D Other specified fracture of right pubis, subsequent encounter for fracture with routine healing: Secondary | ICD-10-CM | POA: Diagnosis not present

## 2022-11-30 DIAGNOSIS — R278 Other lack of coordination: Secondary | ICD-10-CM | POA: Diagnosis not present

## 2022-11-30 DIAGNOSIS — M810 Age-related osteoporosis without current pathological fracture: Secondary | ICD-10-CM | POA: Diagnosis not present

## 2022-11-30 DIAGNOSIS — R4189 Other symptoms and signs involving cognitive functions and awareness: Secondary | ICD-10-CM | POA: Diagnosis not present

## 2022-11-30 DIAGNOSIS — E559 Vitamin D deficiency, unspecified: Secondary | ICD-10-CM | POA: Diagnosis not present

## 2022-11-30 DIAGNOSIS — F0393 Unspecified dementia, unspecified severity, with mood disturbance: Secondary | ICD-10-CM | POA: Diagnosis not present

## 2022-12-01 DIAGNOSIS — F0393 Unspecified dementia, unspecified severity, with mood disturbance: Secondary | ICD-10-CM | POA: Diagnosis not present

## 2022-12-01 DIAGNOSIS — Z741 Need for assistance with personal care: Secondary | ICD-10-CM | POA: Diagnosis not present

## 2022-12-01 DIAGNOSIS — R4189 Other symptoms and signs involving cognitive functions and awareness: Secondary | ICD-10-CM | POA: Diagnosis not present

## 2022-12-01 DIAGNOSIS — R278 Other lack of coordination: Secondary | ICD-10-CM | POA: Diagnosis not present

## 2022-12-01 DIAGNOSIS — S32591D Other specified fracture of right pubis, subsequent encounter for fracture with routine healing: Secondary | ICD-10-CM | POA: Diagnosis not present

## 2022-12-01 DIAGNOSIS — I10 Essential (primary) hypertension: Secondary | ICD-10-CM | POA: Diagnosis not present

## 2022-12-01 DIAGNOSIS — E559 Vitamin D deficiency, unspecified: Secondary | ICD-10-CM | POA: Diagnosis not present

## 2022-12-01 DIAGNOSIS — M810 Age-related osteoporosis without current pathological fracture: Secondary | ICD-10-CM | POA: Diagnosis not present

## 2022-12-01 DIAGNOSIS — S3210XD Unspecified fracture of sacrum, subsequent encounter for fracture with routine healing: Secondary | ICD-10-CM | POA: Diagnosis not present

## 2022-12-05 DIAGNOSIS — S3210XD Unspecified fracture of sacrum, subsequent encounter for fracture with routine healing: Secondary | ICD-10-CM | POA: Diagnosis not present

## 2022-12-05 DIAGNOSIS — R278 Other lack of coordination: Secondary | ICD-10-CM | POA: Diagnosis not present

## 2022-12-05 DIAGNOSIS — Z741 Need for assistance with personal care: Secondary | ICD-10-CM | POA: Diagnosis not present

## 2022-12-05 DIAGNOSIS — F0393 Unspecified dementia, unspecified severity, with mood disturbance: Secondary | ICD-10-CM | POA: Diagnosis not present

## 2022-12-05 DIAGNOSIS — R4189 Other symptoms and signs involving cognitive functions and awareness: Secondary | ICD-10-CM | POA: Diagnosis not present

## 2022-12-05 DIAGNOSIS — S32591D Other specified fracture of right pubis, subsequent encounter for fracture with routine healing: Secondary | ICD-10-CM | POA: Diagnosis not present

## 2022-12-05 DIAGNOSIS — M810 Age-related osteoporosis without current pathological fracture: Secondary | ICD-10-CM | POA: Diagnosis not present

## 2022-12-05 DIAGNOSIS — I10 Essential (primary) hypertension: Secondary | ICD-10-CM | POA: Diagnosis not present

## 2022-12-05 DIAGNOSIS — E559 Vitamin D deficiency, unspecified: Secondary | ICD-10-CM | POA: Diagnosis not present

## 2022-12-06 ENCOUNTER — Non-Acute Institutional Stay: Payer: Medicare PPO | Admitting: Nurse Practitioner

## 2022-12-06 ENCOUNTER — Encounter: Payer: Self-pay | Admitting: Nurse Practitioner

## 2022-12-06 DIAGNOSIS — F03A4 Unspecified dementia, mild, with anxiety: Secondary | ICD-10-CM

## 2022-12-06 DIAGNOSIS — G629 Polyneuropathy, unspecified: Secondary | ICD-10-CM | POA: Diagnosis not present

## 2022-12-06 DIAGNOSIS — M48061 Spinal stenosis, lumbar region without neurogenic claudication: Secondary | ICD-10-CM | POA: Diagnosis not present

## 2022-12-06 DIAGNOSIS — S3210XD Unspecified fracture of sacrum, subsequent encounter for fracture with routine healing: Secondary | ICD-10-CM | POA: Diagnosis not present

## 2022-12-06 DIAGNOSIS — R4189 Other symptoms and signs involving cognitive functions and awareness: Secondary | ICD-10-CM | POA: Diagnosis not present

## 2022-12-06 DIAGNOSIS — R278 Other lack of coordination: Secondary | ICD-10-CM | POA: Diagnosis not present

## 2022-12-06 DIAGNOSIS — F331 Major depressive disorder, recurrent, moderate: Secondary | ICD-10-CM | POA: Diagnosis not present

## 2022-12-06 DIAGNOSIS — E559 Vitamin D deficiency, unspecified: Secondary | ICD-10-CM | POA: Diagnosis not present

## 2022-12-06 DIAGNOSIS — M810 Age-related osteoporosis without current pathological fracture: Secondary | ICD-10-CM | POA: Diagnosis not present

## 2022-12-06 DIAGNOSIS — I10 Essential (primary) hypertension: Secondary | ICD-10-CM | POA: Diagnosis not present

## 2022-12-06 DIAGNOSIS — F0393 Unspecified dementia, unspecified severity, with mood disturbance: Secondary | ICD-10-CM | POA: Diagnosis not present

## 2022-12-06 DIAGNOSIS — F5104 Psychophysiologic insomnia: Secondary | ICD-10-CM | POA: Diagnosis not present

## 2022-12-06 DIAGNOSIS — M5416 Radiculopathy, lumbar region: Secondary | ICD-10-CM

## 2022-12-06 DIAGNOSIS — Z741 Need for assistance with personal care: Secondary | ICD-10-CM | POA: Diagnosis not present

## 2022-12-06 DIAGNOSIS — S32591D Other specified fracture of right pubis, subsequent encounter for fracture with routine healing: Secondary | ICD-10-CM | POA: Diagnosis not present

## 2022-12-06 NOTE — Progress Notes (Signed)
Location:  [facility].  Nursing Home Room Number: V[facility] Son 607P Place of Service:  [id number] [phone]) Abbey Chatters NP  PCP: Earnestine Mealing, MD  Patient Care Team: Earnestine Mealing, MD as PCP - General (Family Medicine) [name], MD as Consulting Physician (Ophthalmology)  Extended Emergency Contact Information Primary Emergency Contact: [facility] Address: [address] [place] [id number] [name] Home Phone: [phone] Mobile Phone: [phone] Relation: Son Secondary Emergency Contact: [name],Debbie          [place], [place] Mobile Phone: [phone] Relation: Daughter  Goals of care: Advanced Directive information    12/06/2022    1:17 PM  Advanced Directives  Does Patient Have a Medical Advance Directive? Yes  Type of Advance Directive Out of facility DNR (pink MOST or yellow form)  Does patient want to make changes to medical advance directive? No - Patient declined     Chief Complaint  Patient presents with   Acute Visit    Hip Pain.     HPI:  Pt is a 89 y.o. female seen today for an acute visit for Hip Pain.  Reports she has had progressive hip pain since July Reports pain is bad in the morning but gets better thought out the day  Reports the pain in her back but travels down right leg.  Pain in the morning is the worst but as the day progresses pain improves Pain is a 5/10.  Tramadol has helped the most with the pain. She has only been taking at night.  Did not know she could ask for it in the morning. Did not leave her room this morning due to severity of the pain.  Gabapentin and tylenol are not touching the pain.  Also has pain in her right groin area.   She was given trazodone for sleep but it did not help until she started on tramadol at night  She reports vivid nightmares.    Past Medical History:  Diagnosis Date   Arthritis    Basal cell carcinoma of tragus, left     Cataract    right   Complication of anesthesia    COULD'T MOVE WHEN WAKING UP AFTER SECONT COLON SURGERY   Depression    Hypertension    Intestinal obstruction (HCC)    Osteoporosis    Past Surgical History:  Procedure Laterality Date   ABDOMINAL HYSTERECTOMY     partial, for menorrhagia   APPENDECTOMY     CATARACT EXTRACTION W/PHACO Right 05/25/2017   Procedure: CATARACT EXTRACTION PHACO AND INTRAOCULAR LENS PLACEMENT (IOC);  Surgeon: [name], MD;  Location: [facility] ORS;  Service: Ophthalmology;  Laterality: Right;  fluid pack lot #  [id number]  exp  12/24/2018 [place]   00:39.4 AP%   9.4 CDE    3.71    COLON SURGERY     INTESTINAL BLOCKAGE FROM SCAR TISSUE X 2   IR KYPHO LUMBAR INC FX REDUCE BONE BX UNI/BIL CANNULATION INC/IMAGING  11/30/2020    Allergies  Allergen Reactions   Fosamax [Alendronate] Diarrhea   Prednisone     Increased blood pressure and insomnia    Outpatient Encounter Medications as of 12/06/2022  Medication Sig   acetaminophen (TYLENOL) 500 MG tablet Take 1,000 mg by mouth 3 (three) times daily as needed. Give Two tablets by mouth twice daily.   amLODipine (NORVASC) 5 MG tablet Take 1 tablet (5 mg total) by  mouth daily.   bismuth subsalicylate (PEPTO BISMOL) 262 MG/15ML suspension Take 30 mLs by mouth as needed.   carbamide peroxide (DEBROX) 6.5 % OTIC solution Place 5 drops into both ears as needed.   cetirizine (ZYRTEC) 5 MG tablet Take 5 mg by mouth daily as needed for allergies.   clonazePAM (KLONOPIN) 0.5 MG tablet Take 0.5 tablets (0.25 mg total) by mouth 2 (two) times daily as needed for anxiety.   dextromethorphan-guaiFENesin (ROBITUSSIN-DM) 10-100 MG/5ML liquid Take 5 mLs by mouth every 4 (four) hours as needed for cough.   donepezil (ARICEPT) 10 MG tablet Take 1 tablet (10 mg total) by mouth at bedtime.   gabapentin (NEURONTIN) 100 MG capsule Take 1 capsule (100 mg total) by mouth 3 (three) times daily as needed.   Glucose 15 GM/32ML GEL Take by  mouth. Give one packet by mouth as needed.   magnesium hydroxide (MILK OF MAGNESIA) 400 MG/5ML suspension Take 5 mLs by mouth daily as needed for mild constipation.   nystatin (MYCOSTATIN/NYSTOP) powder Apply 1 Application topically 2 (two) times daily.   ondansetron (ZOFRAN) 8 MG tablet Take 8 mg by mouth every 8 (eight) hours as needed for nausea or vomiting.   OXYGEN 2 lpm for dyspnea or SOB   polyethylene glycol (MIRALAX / GLYCOLAX) 17 g packet Take 17 g by mouth daily as needed.   sertraline (ZOLOFT) 100 MG tablet TAKE 1 TABLET(100 MG) BY MOUTH AT BEDTIME   traMADol (ULTRAM) 50 MG tablet Take by mouth 2 (two) times daily as needed.   traZODone (DESYREL) 100 MG tablet TAKE 1 TABLET(50 MG) BY MOUTH AT BEDTIME AS NEEDED FOR SLEEP   Vitamin D, Ergocalciferol, (DRISDOL) 1.25 MG (50000 UNIT) CAPS capsule Take 1 capsule (50,000 Units total) by mouth every 7 (seven) days.   Zinc Oxide (TRIPLE PASTE) 12.8 % ointment Apply 1 Application topically. Every shift.   [DISCONTINUED] Wheat Dextrin (BENEFIBER PO) Take 1 Scoop by mouth daily.   No facility-administered encounter medications on file as of 12/06/2022.    Review of Systems  Constitutional:  Negative for activity change, appetite change, fatigue and unexpected weight change.  Eyes: Negative.   Respiratory:  Negative for cough and shortness of breath.   Cardiovascular:  Negative for chest pain, palpitations and leg swelling.  Gastrointestinal:  Negative for abdominal pain, constipation and diarrhea.  Genitourinary:  Negative for difficulty urinating and dysuria.  Musculoskeletal:  Positive for arthralgias, back pain, gait problem and myalgias.  Skin:  Negative for color change and wound.  Neurological:  Negative for dizziness and weakness.  Psychiatric/Behavioral:  Negative for agitation, behavioral problems and confusion.     Immunization History  Administered Date(s) Administered   Fluad Quad(high Dose 65+) 10/23/2018, 10/25/2019    Influenza,inj,quad, With Preservative 11/10/2016   Influenza-Unspecified 11/17/2016, 10/24/2017, 11/11/2022   Moderna Covid-19 Vaccine Bivalent Booster 54yrs & up 08/05/2020, 12/03/2021   Moderna Sars-Covid-2 Vaccination 02/08/2019, 05/14/2019, 11/13/2019, 12/06/2019, 10/15/2020   PFIZER(Purple Top)SARS-COV-2 Vaccination 08/05/2020   PNEUMOCOCCAL CONJUGATE-20 08/31/2022   Pneumococcal Conjugate-13 10/25/2011, 11/22/2016   Pneumococcal Polysaccharide-23 12/13/2017   Unspecified SARS-COV-2 Vaccination 10/21/2022   Pertinent  Health Maintenance Due  Topic Date Due   INFLUENZA VACCINE  Completed   DEXA SCAN  Completed      03/23/2021    2:57 PM 06/22/2021   11:05 AM 04/19/2022   11:28 AM 06/29/2022    9:31 AM 07/25/2022    3:13 PM  Fall Risk  Falls in the past year? 0 0 1 0  1  Was there an injury with Fall? 0 0 0 0 0  Fall Risk Category Calculator 0 0 2 0 1  Fall Risk Category (Retired) Low Low     (RETIRED) Patient Fall Risk Level Low fall risk Low fall risk     Patient at Risk for Falls Due to No Fall Risks Medication side effect History of fall(s);Impaired mobility No Fall Risks No Fall Risks  Fall risk Follow up Falls evaluation completed Falls evaluation completed;Education provided;Falls prevention discussed Falls evaluation completed Falls evaluation completed Falls prevention discussed   Functional Status Survey:    Vitals:   12/06/22 1305 12/06/22 1317  BP: (!) 154/57 (!) 164/58  Pulse: (!) 52   Resp: 17   Temp: 97.6 F (36.4 C)   SpO2: 96%   Weight: 110 lb 6.4 oz (50.1 kg)   Height: 5\' 4"  (1.626 m)    Body mass index is 18.95 kg/m. Physical Exam Constitutional:      General: She is not in acute distress.    Appearance: She is well-developed. She is not diaphoretic.  HENT:     Head: Normocephalic and atraumatic.     Mouth/Throat:     Pharynx: No oropharyngeal exudate.  Eyes:     Conjunctiva/sclera: Conjunctivae normal.     Pupils: Pupils are equal, round, and  reactive to light.  Cardiovascular:     Rate and Rhythm: Normal rate and regular rhythm.     Heart sounds: Normal heart sounds.  Pulmonary:     Effort: Pulmonary effort is normal.     Breath sounds: Normal breath sounds.  Abdominal:     General: Bowel sounds are normal.     Palpations: Abdomen is soft.  Musculoskeletal:     Cervical back: Normal range of motion and neck supple.     Right lower leg: No edema.     Left lower leg: No edema.  Skin:    General: Skin is warm and dry.  Neurological:     Mental Status: She is alert.  Psychiatric:        Mood and Affect: Mood normal.     Labs reviewed: Recent Labs    07/26/22 1201 08/16/22 0934 08/17/22 0401 08/26/22 0000 09/22/22 0000 09/29/22 0000  NA 136 136 134* 130* 138 140  K 4.4 3.8 3.8 4.3 4.4 4.2  CL 97 101 104 93* 102 99  CO2 30 28 27  29* 31* 34*  GLUCOSE 93 89 90  --   --   --   BUN 17 12 15 21 18 12   CREATININE 0.93 0.74 0.85 1.1 0.9 0.9  CALCIUM 9.6 8.6* 8.0* 8.3* 8.7 9.5   Recent Labs    08/17/22 0401 09/22/22 0000  AST 16 15  ALT 10 10  ALKPHOS 66 184*  BILITOT 0.5  --   PROT 5.9*  --   ALBUMIN 3.0* 3.5   Recent Labs    08/16/22 0934 08/17/22 0401 08/26/22 0000 09/22/22 0000  WBC 7.2 6.2 12.4 3.9  NEUTROABS 4.6  --   --  1,572.00  HGB 10.5* 10.3* 10.2* 10.5*  HCT 32.8* 32.3* 31* 32*  MCV 100.0 100.6*  --   --   PLT 389 402* 335 274   Lab Results  Component Value Date   TSH 1.43 04/10/2020   Lab Results  Component Value Date   HGBA1C 5.9 08/03/2021   Lab Results  Component Value Date   CHOL 208 (H) 08/03/2021   HDL 96.20 08/03/2021  LDLCALC 97 08/03/2021   TRIG 78.0 08/03/2021   CHOLHDL 2 08/03/2021    Significant Diagnostic Results in last 30 days:  No results found.  Assessment/Plan 1. Spinal stenosis of lumbar region with radiculopathy Continues with PT. Has had injections in the past that stopped working.  Has had fracture in spine.  -gabapentin and tylenol not  effective  Tramadol beneficial.  Will schedule twice daily at this time as she is needing routinely and having to ask staff to get medication.   2. Neuropathy Ongoing, reports gabapentin has not been effective, will dc at this time.   3. Chronic insomnia Improved on trazodone  Continue proper sleep hygiene.   4. Mild dementia with anxiety, unspecified dementia type (HCC) -stable. Taking aricept but reports nightmares, will change medication to take in AM at this time.   5. Depression Ongoing, feels like zoloft has not really benefited her in the past, with recent increase in trazodone and scheduling tramadol will decrease zoloft to 50 mg daily and monitor for worsening symptoms.   Janene Harvey. Biagio Borg [facility] [phone]

## 2022-12-07 ENCOUNTER — Other Ambulatory Visit: Payer: Self-pay | Admitting: Nurse Practitioner

## 2022-12-07 DIAGNOSIS — I10 Essential (primary) hypertension: Secondary | ICD-10-CM | POA: Diagnosis not present

## 2022-12-07 DIAGNOSIS — M810 Age-related osteoporosis without current pathological fracture: Secondary | ICD-10-CM | POA: Diagnosis not present

## 2022-12-07 DIAGNOSIS — S32591D Other specified fracture of right pubis, subsequent encounter for fracture with routine healing: Secondary | ICD-10-CM | POA: Diagnosis not present

## 2022-12-07 DIAGNOSIS — S3210XD Unspecified fracture of sacrum, subsequent encounter for fracture with routine healing: Secondary | ICD-10-CM | POA: Diagnosis not present

## 2022-12-07 DIAGNOSIS — F0393 Unspecified dementia, unspecified severity, with mood disturbance: Secondary | ICD-10-CM | POA: Diagnosis not present

## 2022-12-07 DIAGNOSIS — R4189 Other symptoms and signs involving cognitive functions and awareness: Secondary | ICD-10-CM | POA: Diagnosis not present

## 2022-12-07 DIAGNOSIS — Z741 Need for assistance with personal care: Secondary | ICD-10-CM | POA: Diagnosis not present

## 2022-12-07 DIAGNOSIS — E559 Vitamin D deficiency, unspecified: Secondary | ICD-10-CM | POA: Diagnosis not present

## 2022-12-07 DIAGNOSIS — R278 Other lack of coordination: Secondary | ICD-10-CM | POA: Diagnosis not present

## 2022-12-07 MED ORDER — TRAMADOL HCL 50 MG PO TABS: 50.0000 mg | ORAL_TABLET | Freq: Two times a day (BID) | ORAL | 0 refills | Status: DC

## 2022-12-08 DIAGNOSIS — S3210XD Unspecified fracture of sacrum, subsequent encounter for fracture with routine healing: Secondary | ICD-10-CM | POA: Diagnosis not present

## 2022-12-08 DIAGNOSIS — Z741 Need for assistance with personal care: Secondary | ICD-10-CM | POA: Diagnosis not present

## 2022-12-08 DIAGNOSIS — R278 Other lack of coordination: Secondary | ICD-10-CM | POA: Diagnosis not present

## 2022-12-08 DIAGNOSIS — R4189 Other symptoms and signs involving cognitive functions and awareness: Secondary | ICD-10-CM | POA: Diagnosis not present

## 2022-12-08 DIAGNOSIS — I10 Essential (primary) hypertension: Secondary | ICD-10-CM | POA: Diagnosis not present

## 2022-12-08 DIAGNOSIS — F0393 Unspecified dementia, unspecified severity, with mood disturbance: Secondary | ICD-10-CM | POA: Diagnosis not present

## 2022-12-08 DIAGNOSIS — M810 Age-related osteoporosis without current pathological fracture: Secondary | ICD-10-CM | POA: Diagnosis not present

## 2022-12-08 DIAGNOSIS — E559 Vitamin D deficiency, unspecified: Secondary | ICD-10-CM | POA: Diagnosis not present

## 2022-12-08 DIAGNOSIS — S32591D Other specified fracture of right pubis, subsequent encounter for fracture with routine healing: Secondary | ICD-10-CM | POA: Diagnosis not present

## 2022-12-12 DIAGNOSIS — S32591D Other specified fracture of right pubis, subsequent encounter for fracture with routine healing: Secondary | ICD-10-CM | POA: Diagnosis not present

## 2022-12-12 DIAGNOSIS — M810 Age-related osteoporosis without current pathological fracture: Secondary | ICD-10-CM | POA: Diagnosis not present

## 2022-12-12 DIAGNOSIS — F0393 Unspecified dementia, unspecified severity, with mood disturbance: Secondary | ICD-10-CM | POA: Diagnosis not present

## 2022-12-12 DIAGNOSIS — S3210XD Unspecified fracture of sacrum, subsequent encounter for fracture with routine healing: Secondary | ICD-10-CM | POA: Diagnosis not present

## 2022-12-12 DIAGNOSIS — E559 Vitamin D deficiency, unspecified: Secondary | ICD-10-CM | POA: Diagnosis not present

## 2022-12-12 DIAGNOSIS — Z741 Need for assistance with personal care: Secondary | ICD-10-CM | POA: Diagnosis not present

## 2022-12-12 DIAGNOSIS — I10 Essential (primary) hypertension: Secondary | ICD-10-CM | POA: Diagnosis not present

## 2022-12-12 DIAGNOSIS — R278 Other lack of coordination: Secondary | ICD-10-CM | POA: Diagnosis not present

## 2022-12-12 DIAGNOSIS — R4189 Other symptoms and signs involving cognitive functions and awareness: Secondary | ICD-10-CM | POA: Diagnosis not present

## 2022-12-13 DIAGNOSIS — M810 Age-related osteoporosis without current pathological fracture: Secondary | ICD-10-CM | POA: Diagnosis not present

## 2022-12-13 DIAGNOSIS — E559 Vitamin D deficiency, unspecified: Secondary | ICD-10-CM | POA: Diagnosis not present

## 2022-12-13 DIAGNOSIS — F0393 Unspecified dementia, unspecified severity, with mood disturbance: Secondary | ICD-10-CM | POA: Diagnosis not present

## 2022-12-13 DIAGNOSIS — I10 Essential (primary) hypertension: Secondary | ICD-10-CM | POA: Diagnosis not present

## 2022-12-13 DIAGNOSIS — Z741 Need for assistance with personal care: Secondary | ICD-10-CM | POA: Diagnosis not present

## 2022-12-13 DIAGNOSIS — R278 Other lack of coordination: Secondary | ICD-10-CM | POA: Diagnosis not present

## 2022-12-13 DIAGNOSIS — S3210XD Unspecified fracture of sacrum, subsequent encounter for fracture with routine healing: Secondary | ICD-10-CM | POA: Diagnosis not present

## 2022-12-13 DIAGNOSIS — R4189 Other symptoms and signs involving cognitive functions and awareness: Secondary | ICD-10-CM | POA: Diagnosis not present

## 2022-12-13 DIAGNOSIS — S32591D Other specified fracture of right pubis, subsequent encounter for fracture with routine healing: Secondary | ICD-10-CM | POA: Diagnosis not present

## 2022-12-14 DIAGNOSIS — Z741 Need for assistance with personal care: Secondary | ICD-10-CM | POA: Diagnosis not present

## 2022-12-14 DIAGNOSIS — F0393 Unspecified dementia, unspecified severity, with mood disturbance: Secondary | ICD-10-CM | POA: Diagnosis not present

## 2022-12-14 DIAGNOSIS — S3210XD Unspecified fracture of sacrum, subsequent encounter for fracture with routine healing: Secondary | ICD-10-CM | POA: Diagnosis not present

## 2022-12-14 DIAGNOSIS — I10 Essential (primary) hypertension: Secondary | ICD-10-CM | POA: Diagnosis not present

## 2022-12-14 DIAGNOSIS — R278 Other lack of coordination: Secondary | ICD-10-CM | POA: Diagnosis not present

## 2022-12-14 DIAGNOSIS — M810 Age-related osteoporosis without current pathological fracture: Secondary | ICD-10-CM | POA: Diagnosis not present

## 2022-12-14 DIAGNOSIS — R4189 Other symptoms and signs involving cognitive functions and awareness: Secondary | ICD-10-CM | POA: Diagnosis not present

## 2022-12-14 DIAGNOSIS — S32591D Other specified fracture of right pubis, subsequent encounter for fracture with routine healing: Secondary | ICD-10-CM | POA: Diagnosis not present

## 2022-12-14 DIAGNOSIS — E559 Vitamin D deficiency, unspecified: Secondary | ICD-10-CM | POA: Diagnosis not present

## 2022-12-19 DIAGNOSIS — I10 Essential (primary) hypertension: Secondary | ICD-10-CM | POA: Diagnosis not present

## 2022-12-19 DIAGNOSIS — E559 Vitamin D deficiency, unspecified: Secondary | ICD-10-CM | POA: Diagnosis not present

## 2022-12-19 DIAGNOSIS — R4189 Other symptoms and signs involving cognitive functions and awareness: Secondary | ICD-10-CM | POA: Diagnosis not present

## 2022-12-19 DIAGNOSIS — S32591D Other specified fracture of right pubis, subsequent encounter for fracture with routine healing: Secondary | ICD-10-CM | POA: Diagnosis not present

## 2022-12-19 DIAGNOSIS — F0393 Unspecified dementia, unspecified severity, with mood disturbance: Secondary | ICD-10-CM | POA: Diagnosis not present

## 2022-12-19 DIAGNOSIS — R278 Other lack of coordination: Secondary | ICD-10-CM | POA: Diagnosis not present

## 2022-12-19 DIAGNOSIS — M810 Age-related osteoporosis without current pathological fracture: Secondary | ICD-10-CM | POA: Diagnosis not present

## 2022-12-19 DIAGNOSIS — S3210XD Unspecified fracture of sacrum, subsequent encounter for fracture with routine healing: Secondary | ICD-10-CM | POA: Diagnosis not present

## 2022-12-19 DIAGNOSIS — Z741 Need for assistance with personal care: Secondary | ICD-10-CM | POA: Diagnosis not present

## 2022-12-20 DIAGNOSIS — R4189 Other symptoms and signs involving cognitive functions and awareness: Secondary | ICD-10-CM | POA: Diagnosis not present

## 2022-12-20 DIAGNOSIS — S3210XD Unspecified fracture of sacrum, subsequent encounter for fracture with routine healing: Secondary | ICD-10-CM | POA: Diagnosis not present

## 2022-12-20 DIAGNOSIS — F0393 Unspecified dementia, unspecified severity, with mood disturbance: Secondary | ICD-10-CM | POA: Diagnosis not present

## 2022-12-20 DIAGNOSIS — Z741 Need for assistance with personal care: Secondary | ICD-10-CM | POA: Diagnosis not present

## 2022-12-20 DIAGNOSIS — E559 Vitamin D deficiency, unspecified: Secondary | ICD-10-CM | POA: Diagnosis not present

## 2022-12-20 DIAGNOSIS — S32591D Other specified fracture of right pubis, subsequent encounter for fracture with routine healing: Secondary | ICD-10-CM | POA: Diagnosis not present

## 2022-12-20 DIAGNOSIS — R278 Other lack of coordination: Secondary | ICD-10-CM | POA: Diagnosis not present

## 2022-12-20 DIAGNOSIS — I10 Essential (primary) hypertension: Secondary | ICD-10-CM | POA: Diagnosis not present

## 2022-12-20 DIAGNOSIS — M810 Age-related osteoporosis without current pathological fracture: Secondary | ICD-10-CM | POA: Diagnosis not present

## 2022-12-21 DIAGNOSIS — S32591D Other specified fracture of right pubis, subsequent encounter for fracture with routine healing: Secondary | ICD-10-CM | POA: Diagnosis not present

## 2022-12-21 DIAGNOSIS — S3210XD Unspecified fracture of sacrum, subsequent encounter for fracture with routine healing: Secondary | ICD-10-CM | POA: Diagnosis not present

## 2022-12-21 DIAGNOSIS — M810 Age-related osteoporosis without current pathological fracture: Secondary | ICD-10-CM | POA: Diagnosis not present

## 2022-12-21 DIAGNOSIS — R4189 Other symptoms and signs involving cognitive functions and awareness: Secondary | ICD-10-CM | POA: Diagnosis not present

## 2022-12-21 DIAGNOSIS — E559 Vitamin D deficiency, unspecified: Secondary | ICD-10-CM | POA: Diagnosis not present

## 2022-12-21 DIAGNOSIS — R278 Other lack of coordination: Secondary | ICD-10-CM | POA: Diagnosis not present

## 2022-12-21 DIAGNOSIS — I10 Essential (primary) hypertension: Secondary | ICD-10-CM | POA: Diagnosis not present

## 2022-12-21 DIAGNOSIS — Z741 Need for assistance with personal care: Secondary | ICD-10-CM | POA: Diagnosis not present

## 2022-12-21 DIAGNOSIS — F0393 Unspecified dementia, unspecified severity, with mood disturbance: Secondary | ICD-10-CM | POA: Diagnosis not present

## 2022-12-27 DIAGNOSIS — S32591D Other specified fracture of right pubis, subsequent encounter for fracture with routine healing: Secondary | ICD-10-CM | POA: Diagnosis not present

## 2022-12-27 DIAGNOSIS — S3210XD Unspecified fracture of sacrum, subsequent encounter for fracture with routine healing: Secondary | ICD-10-CM | POA: Diagnosis not present

## 2022-12-27 DIAGNOSIS — Z741 Need for assistance with personal care: Secondary | ICD-10-CM | POA: Diagnosis not present

## 2022-12-27 DIAGNOSIS — I10 Essential (primary) hypertension: Secondary | ICD-10-CM | POA: Diagnosis not present

## 2022-12-27 DIAGNOSIS — R278 Other lack of coordination: Secondary | ICD-10-CM | POA: Diagnosis not present

## 2022-12-27 DIAGNOSIS — R4189 Other symptoms and signs involving cognitive functions and awareness: Secondary | ICD-10-CM | POA: Diagnosis not present

## 2022-12-27 DIAGNOSIS — E559 Vitamin D deficiency, unspecified: Secondary | ICD-10-CM | POA: Diagnosis not present

## 2022-12-27 DIAGNOSIS — F0393 Unspecified dementia, unspecified severity, with mood disturbance: Secondary | ICD-10-CM | POA: Diagnosis not present

## 2022-12-27 DIAGNOSIS — M810 Age-related osteoporosis without current pathological fracture: Secondary | ICD-10-CM | POA: Diagnosis not present

## 2022-12-28 ENCOUNTER — Ambulatory Visit: Payer: Medicare PPO | Admitting: Family Medicine

## 2022-12-28 ENCOUNTER — Ambulatory Visit (INDEPENDENT_AMBULATORY_CARE_PROVIDER_SITE_OTHER)
Admission: RE | Admit: 2022-12-28 | Discharge: 2022-12-28 | Disposition: A | Discharge status: Home / Self Care | Payer: Medicare PPO | Source: Ambulatory Visit | Attending: Family Medicine | Admitting: Family Medicine

## 2022-12-28 ENCOUNTER — Encounter: Payer: Self-pay | Admitting: Family Medicine

## 2022-12-28 VITALS — BP 124/62 | HR 62 | Temp 97.9°F | Ht 64.0 in | Wt 111.6 lb

## 2022-12-28 DIAGNOSIS — M48061 Spinal stenosis, lumbar region without neurogenic claudication: Secondary | ICD-10-CM | POA: Diagnosis not present

## 2022-12-28 DIAGNOSIS — M7918 Myalgia, other site: Secondary | ICD-10-CM | POA: Diagnosis not present

## 2022-12-28 DIAGNOSIS — S32511D Fracture of superior rim of right pubis, subsequent encounter for fracture with routine healing: Secondary | ICD-10-CM | POA: Diagnosis not present

## 2022-12-28 DIAGNOSIS — M5416 Radiculopathy, lumbar region: Secondary | ICD-10-CM | POA: Diagnosis not present

## 2022-12-28 DIAGNOSIS — M48062 Spinal stenosis, lumbar region with neurogenic claudication: Secondary | ICD-10-CM | POA: Diagnosis not present

## 2022-12-28 DIAGNOSIS — I878 Other specified disorders of veins: Secondary | ICD-10-CM | POA: Diagnosis not present

## 2022-12-28 DIAGNOSIS — M160 Bilateral primary osteoarthritis of hip: Secondary | ICD-10-CM | POA: Diagnosis not present

## 2022-12-28 DIAGNOSIS — G8929 Other chronic pain: Secondary | ICD-10-CM

## 2022-12-28 DIAGNOSIS — M25551 Pain in right hip: Secondary | ICD-10-CM | POA: Diagnosis not present

## 2022-12-28 DIAGNOSIS — M25561 Pain in right knee: Secondary | ICD-10-CM | POA: Diagnosis not present

## 2022-12-28 DIAGNOSIS — M810 Age-related osteoporosis without current pathological fracture: Secondary | ICD-10-CM

## 2022-12-28 DIAGNOSIS — M25552 Pain in left hip: Secondary | ICD-10-CM

## 2022-12-28 MED ORDER — GABAPENTIN 100 MG PO CAPS: 100.0000 mg | ORAL_CAPSULE | Freq: Three times a day (TID) | ORAL | Status: DC | PRN

## 2022-12-28 MED ORDER — GABAPENTIN 100 MG PO CAPS: 100.0000 mg | ORAL_CAPSULE | Freq: Three times a day (TID) | ORAL | 1 refills | Status: DC | PRN

## 2022-12-28 MED ORDER — CELECOXIB 200 MG PO CAPS: 200.0000 mg | ORAL_CAPSULE | Freq: Two times a day (BID) | ORAL | 0 refills | Status: DC

## 2022-12-28 NOTE — Assessment & Plan Note (Signed)
Chronic, likely contributing to bilateral leg pain.  In the past she had been using gabapentin 100 mg p.o. 3 times daily without side effects.  She thinks the nursing home is getting this to her but it is not on her MAR. Will restart this.  Given tramadol does not help with pain will DC this to avoid oversedation.

## 2022-12-28 NOTE — Progress Notes (Signed)
Patient ID: Catherine Young, female    DOB: 1931-12-09, 89 y.o.   MRN: [id number]  This visit was conducted in person.  BP 124/62   Pulse 62   Temp 97.9 F (36.6 C)   Ht 5\' 4"  (1.626 m)   Wt 111 lb 9.6 oz (50.6 kg)   SpO2 90%   BMI 19.16 kg/m    CC:  Chief Complaint  Patient presents with   Hip Pain   Knee Pain    Patient states that the pain is overall all over. Knees, neck and joints. She is requesting a possible xray. Patient states that the knees are worse, followed by hips. Pain scale is 10    Subjective:   HPI: Catherine Young is a 89 y.o. female presenting on 12/28/2022 for Hip Pain and Knee Pain (Patient states that the pain is overall all over. Knees, neck and joints. She is requesting a possible xray. Patient states that the knees are worse, followed by hips. Pain scale is 10)   She is now living at [facility] [place].   Pt reports pain all over.Marland Kitchen knees, neck and hips  Worst pain is bilateral knees  Using gabapentin 100 mg TID, tramadol 50 mg  BID prn  Doing PT  Pt has history of chronic pain in hips, spine and knees  History of  spinal stenosis compression fracture in spine, pubic ramus and osteoporosis     08/06/2022 right knee film:IMPRESSION: 1. No acute fracture or dislocation. 2. Moderate degenerative changes at the right hip.  Film of  hip: Moderate degenerative changes are present at the right hip.    08/16/2022: Highly comminuted and displaced fractures of the right pubic rami and pubic symphysis. Displacement (series 6, image 28) and associated left space of Retzius hematoma (19 mL). Comminuted, less displaced fracture of the right sacral ala. Hips remain intact.  Reviewed last OV note from Ortho 09/14/2022  Relevant past medical, surgical, family and social history reviewed and updated as indicated. Interim medical history since our last visit reviewed. Allergies and medications reviewed and updated. Outpatient Medications Prior to  Visit  Medication Sig Dispense Refill   acetaminophen (TYLENOL) 500 MG tablet Take 1,000 mg by mouth 3 (three) times daily as needed. Give Two tablets by mouth twice daily.     amLODipine (NORVASC) 5 MG tablet Take 1 tablet (5 mg total) by mouth daily. 30 tablet 0   bismuth subsalicylate (PEPTO BISMOL) 262 MG/15ML suspension Take 30 mLs by mouth as needed.     carbamide peroxide (DEBROX) 6.5 % OTIC solution Place 5 drops into both ears as needed.     cetirizine (ZYRTEC) 5 MG tablet Take 5 mg by mouth daily as needed for allergies.     clonazePAM (KLONOPIN) 0.5 MG tablet Take 0.5 tablets (0.25 mg total) by mouth 2 (two) times daily as needed for anxiety. 60 tablet 0   dextromethorphan-guaiFENesin (ROBITUSSIN-DM) 10-100 MG/5ML liquid Take 5 mLs by mouth every 4 (four) hours as needed for cough.     donepezil (ARICEPT) 10 MG tablet Take 1 tablet (10 mg total) by mouth at bedtime. 90 tablet 3   Glucose 15 GM/32ML GEL Take by mouth. Give one packet by mouth as needed.     magnesium hydroxide (MILK OF MAGNESIA) 400 MG/5ML suspension Take 5 mLs by mouth daily as needed for mild constipation.     nystatin (MYCOSTATIN/NYSTOP) powder Apply 1 Application topically 2 (two) times daily.  ondansetron (ZOFRAN) 8 MG tablet Take 8 mg by mouth every 8 (eight) hours as needed for nausea or vomiting.     OXYGEN 2 lpm for dyspnea or SOB     polyethylene glycol (MIRALAX / GLYCOLAX) 17 g packet Take 17 g by mouth daily as needed. 30 each 0   sertraline (ZOLOFT) 100 MG tablet TAKE 1 TABLET(100 MG) BY MOUTH AT BEDTIME 90 tablet 0   traZODone (DESYREL) 100 MG tablet TAKE 1 TABLET(50 MG) BY MOUTH AT BEDTIME AS NEEDED FOR SLEEP 30 tablet 3   Vitamin D, Ergocalciferol, (DRISDOL) 1.25 MG (50000 UNIT) CAPS capsule Take 1 capsule (50,000 Units total) by mouth every 7 (seven) days. 12 capsule 0   Zinc Oxide (TRIPLE PASTE) 12.8 % ointment Apply 1 Application topically. Every shift.     gabapentin (NEURONTIN) 100 MG capsule  Take 1 capsule (100 mg total) by mouth 3 (three) times daily as needed.     traMADol (ULTRAM) 50 MG tablet Take 1 tablet (50 mg total) by mouth 2 (two) times daily. 60 tablet 0   No facility-administered medications prior to visit.     Per HPI unless specifically indicated in ROS section below Review of Systems  Constitutional:  Negative for fatigue and fever.  HENT:  Negative for congestion.   Eyes:  Negative for pain.  Respiratory:  Negative for cough and shortness of breath.   Cardiovascular:  Negative for chest pain, palpitations and leg swelling.  Gastrointestinal:  Negative for abdominal pain.  Genitourinary:  Negative for dysuria and vaginal bleeding.  Musculoskeletal:  Positive for arthralgias, back pain and joint swelling.  Neurological:  Negative for syncope, light-headedness and headaches.  Psychiatric/Behavioral:  Negative for dysphoric mood.    Objective:  BP 124/62   Pulse 62   Temp 97.9 F (36.6 C)   Ht 5\' 4"  (1.626 m)   Wt 111 lb 9.6 oz (50.6 kg)   SpO2 90%   BMI 19.16 kg/m   Wt Readings from Last 3 Encounters:  12/28/22 111 lb 9.6 oz (50.6 kg)  12/06/22 110 lb 6.4 oz (50.1 kg)  10/31/22 110 lb 6.4 oz (50.1 kg)      Physical Exam Musculoskeletal:     Thoracic back: No tenderness or bony tenderness. Decreased range of motion. Scoliosis present.     Lumbar back: No tenderness or bony tenderness. Decreased range of motion. Negative right straight leg raise test and negative left straight leg raise test. Scoliosis present.     Right knee: Swelling and deformity present. No effusion. Decreased range of motion. Tenderness present over the medial joint line and lateral joint line.     Left knee: No swelling, deformity or effusion. Decreased range of motion. Tenderness present over the medial joint line and lateral joint line.       Results for orders placed or performed in visit on 10/13/22  CBC and differential  Result Value Ref Range   Hemoglobin 10.5 (A)  12.0 - 16.0   HCT 32 (A) 36 - 46   Neutrophils Absolute 1,572.00    Platelets 274 150 - 400 K/uL   WBC 3.9   CBC  Result Value Ref Range   RBC 3.31 (A) 3.87 - 5.11  Basic metabolic panel  Result Value Ref Range   Glucose 80    BUN 18 4 - 21   CO2 31 (A) 13 - 22   Creatinine 0.9 0.5 - 1.1   Potassium 4.4 3.5 - 5.1 mEq/L   Sodium  138 137 - 147   Chloride 102 99 - 108  Comprehensive metabolic panel  Result Value Ref Range   Globulin 2.7    eGFR 60    Calcium 8.7 8.7 - 10.7   Albumin 3.5 3.5 - 5.0  Hepatic function panel  Result Value Ref Range   Alkaline Phosphatase 184 (A) 25 - 125   ALT 10 7 - 35 U/L   AST 15 13 - 35   Bilirubin, Total 0.4   Basic metabolic panel  Result Value Ref Range   Glucose 86    BUN 12 4 - 21   CO2 34 (A) 13 - 22   Creatinine 0.9 0.5 - 1.1   Potassium 4.2 3.5 - 5.1 mEq/L   Sodium 140 137 - 147   Chloride 99 99 - 108  Comprehensive metabolic panel  Result Value Ref Range   eGFR 66    Calcium 9.5 8.7 - 10.7    Assessment and Plan  Bilateral hip pain Assessment & Plan: Chronic and we will reevaluate with x-ray given past pubic rami fracture to assess for adequate and typical healing.  No recent falls.  Orders: -     DG HIPS BILAT W OR W/O PELVIS 3-4 VIEWS; Future  Chronic pain of right knee Assessment & Plan:  Acute flare of chronic issue. Past x-ray has shown significant osteoarthritis. Will start Celebrex 200 mg p.o. twice daily.  Discussed risk of side effect and long-term cardiac complications but patient's quality of life is poor given pain and she is willing to accept these risks. Also recommend returning to critical orthopedics for possible steroid injection in right knee.  No indication for x-ray given no recent fall.   Musculoskeletal pain  Spinal stenosis of lumbar region with radiculopathy -     Gabapentin; Take 1 capsule (100 mg total) by mouth 3 (three) times daily as needed.  Dispense: 90 capsule; Refill:  1  Age-related osteoporosis without current pathological fracture Assessment & Plan: Chronic, high risk for fracture   Spinal stenosis of lumbar region with neurogenic claudication Assessment & Plan: Chronic, likely contributing to bilateral leg pain.  In the past she had been using gabapentin 100 mg p.o. 3 times daily without side effects.  She thinks the nursing home is getting this to her but it is not on her MAR. Will restart this.  Given tramadol does not help with pain will DC this to avoid oversedation.   Other orders -     Celecoxib; Take 1 capsule (200 mg total) by mouth 2 (two) times daily.  Dispense: 60 capsule; Refill: 0    No follow-ups on file.   Kerby Nora, MD

## 2022-12-28 NOTE — Assessment & Plan Note (Signed)
Acute flare of chronic issue. Past x-ray has shown significant osteoarthritis. Will start Celebrex 200 mg p.o. twice daily.  Discussed risk of side effect and long-term cardiac complications but patient's quality of life is poor given pain and she is willing to accept these risks. Also recommend returning to critical orthopedics for possible steroid injection in right knee.  No indication for x-ray given no recent fall.

## 2022-12-28 NOTE — Assessment & Plan Note (Signed)
Chronic, high risk for fracture

## 2022-12-28 NOTE — Assessment & Plan Note (Signed)
Chronic and we will reevaluate with x-ray given past pubic rami fracture to assess for adequate and typical healing.  No recent falls.

## 2022-12-28 NOTE — Patient Instructions (Addendum)
Call Ortho to see them for right knee pain.  We will call with X-ray.  Start Celebrex 200 mg BID for pain  inflammation, continue or restart gabapentin.

## 2022-12-29 DIAGNOSIS — H353131 Nonexudative age-related macular degeneration, bilateral, early dry stage: Secondary | ICD-10-CM | POA: Diagnosis not present

## 2022-12-29 DIAGNOSIS — S32591D Other specified fracture of right pubis, subsequent encounter for fracture with routine healing: Secondary | ICD-10-CM | POA: Diagnosis not present

## 2022-12-29 DIAGNOSIS — R4189 Other symptoms and signs involving cognitive functions and awareness: Secondary | ICD-10-CM | POA: Diagnosis not present

## 2022-12-29 DIAGNOSIS — Z741 Need for assistance with personal care: Secondary | ICD-10-CM | POA: Diagnosis not present

## 2022-12-29 DIAGNOSIS — H40023 Open angle with borderline findings, high risk, bilateral: Secondary | ICD-10-CM | POA: Diagnosis not present

## 2022-12-29 DIAGNOSIS — Z9841 Cataract extraction status, right eye: Secondary | ICD-10-CM | POA: Diagnosis not present

## 2022-12-29 DIAGNOSIS — I10 Essential (primary) hypertension: Secondary | ICD-10-CM | POA: Diagnosis not present

## 2022-12-29 DIAGNOSIS — S3210XD Unspecified fracture of sacrum, subsequent encounter for fracture with routine healing: Secondary | ICD-10-CM | POA: Diagnosis not present

## 2022-12-29 DIAGNOSIS — F0393 Unspecified dementia, unspecified severity, with mood disturbance: Secondary | ICD-10-CM | POA: Diagnosis not present

## 2022-12-29 DIAGNOSIS — E559 Vitamin D deficiency, unspecified: Secondary | ICD-10-CM | POA: Diagnosis not present

## 2022-12-29 DIAGNOSIS — H2512 Age-related nuclear cataract, left eye: Secondary | ICD-10-CM | POA: Diagnosis not present

## 2022-12-29 DIAGNOSIS — H524 Presbyopia: Secondary | ICD-10-CM | POA: Diagnosis not present

## 2022-12-29 DIAGNOSIS — M810 Age-related osteoporosis without current pathological fracture: Secondary | ICD-10-CM | POA: Diagnosis not present

## 2022-12-29 DIAGNOSIS — R278 Other lack of coordination: Secondary | ICD-10-CM | POA: Diagnosis not present

## 2023-01-02 DIAGNOSIS — R278 Other lack of coordination: Secondary | ICD-10-CM | POA: Diagnosis not present

## 2023-01-02 DIAGNOSIS — S32591D Other specified fracture of right pubis, subsequent encounter for fracture with routine healing: Secondary | ICD-10-CM | POA: Diagnosis not present

## 2023-01-02 DIAGNOSIS — S3210XD Unspecified fracture of sacrum, subsequent encounter for fracture with routine healing: Secondary | ICD-10-CM | POA: Diagnosis not present

## 2023-01-02 DIAGNOSIS — I10 Essential (primary) hypertension: Secondary | ICD-10-CM | POA: Diagnosis not present

## 2023-01-02 DIAGNOSIS — R4189 Other symptoms and signs involving cognitive functions and awareness: Secondary | ICD-10-CM | POA: Diagnosis not present

## 2023-01-02 DIAGNOSIS — Z741 Need for assistance with personal care: Secondary | ICD-10-CM | POA: Diagnosis not present

## 2023-01-02 DIAGNOSIS — F0393 Unspecified dementia, unspecified severity, with mood disturbance: Secondary | ICD-10-CM | POA: Diagnosis not present

## 2023-01-02 DIAGNOSIS — E559 Vitamin D deficiency, unspecified: Secondary | ICD-10-CM | POA: Diagnosis not present

## 2023-01-02 DIAGNOSIS — M810 Age-related osteoporosis without current pathological fracture: Secondary | ICD-10-CM | POA: Diagnosis not present

## 2023-01-03 DIAGNOSIS — S32591D Other specified fracture of right pubis, subsequent encounter for fracture with routine healing: Secondary | ICD-10-CM | POA: Diagnosis not present

## 2023-01-03 DIAGNOSIS — S3210XD Unspecified fracture of sacrum, subsequent encounter for fracture with routine healing: Secondary | ICD-10-CM | POA: Diagnosis not present

## 2023-01-03 DIAGNOSIS — R4189 Other symptoms and signs involving cognitive functions and awareness: Secondary | ICD-10-CM | POA: Diagnosis not present

## 2023-01-03 DIAGNOSIS — R278 Other lack of coordination: Secondary | ICD-10-CM | POA: Diagnosis not present

## 2023-01-03 DIAGNOSIS — I10 Essential (primary) hypertension: Secondary | ICD-10-CM | POA: Diagnosis not present

## 2023-01-03 DIAGNOSIS — E559 Vitamin D deficiency, unspecified: Secondary | ICD-10-CM | POA: Diagnosis not present

## 2023-01-03 DIAGNOSIS — M810 Age-related osteoporosis without current pathological fracture: Secondary | ICD-10-CM | POA: Diagnosis not present

## 2023-01-03 DIAGNOSIS — Z741 Need for assistance with personal care: Secondary | ICD-10-CM | POA: Diagnosis not present

## 2023-01-03 DIAGNOSIS — F0393 Unspecified dementia, unspecified severity, with mood disturbance: Secondary | ICD-10-CM | POA: Diagnosis not present

## 2023-01-04 DIAGNOSIS — F0393 Unspecified dementia, unspecified severity, with mood disturbance: Secondary | ICD-10-CM | POA: Diagnosis not present

## 2023-01-04 DIAGNOSIS — Z741 Need for assistance with personal care: Secondary | ICD-10-CM | POA: Diagnosis not present

## 2023-01-04 DIAGNOSIS — I10 Essential (primary) hypertension: Secondary | ICD-10-CM | POA: Diagnosis not present

## 2023-01-04 DIAGNOSIS — S32591D Other specified fracture of right pubis, subsequent encounter for fracture with routine healing: Secondary | ICD-10-CM | POA: Diagnosis not present

## 2023-01-04 DIAGNOSIS — R4189 Other symptoms and signs involving cognitive functions and awareness: Secondary | ICD-10-CM | POA: Diagnosis not present

## 2023-01-04 DIAGNOSIS — E559 Vitamin D deficiency, unspecified: Secondary | ICD-10-CM | POA: Diagnosis not present

## 2023-01-04 DIAGNOSIS — R278 Other lack of coordination: Secondary | ICD-10-CM | POA: Diagnosis not present

## 2023-01-04 DIAGNOSIS — S3210XD Unspecified fracture of sacrum, subsequent encounter for fracture with routine healing: Secondary | ICD-10-CM | POA: Diagnosis not present

## 2023-01-04 DIAGNOSIS — M810 Age-related osteoporosis without current pathological fracture: Secondary | ICD-10-CM | POA: Diagnosis not present

## 2023-01-09 ENCOUNTER — Other Ambulatory Visit: Payer: Self-pay | Admitting: Nurse Practitioner

## 2023-01-09 DIAGNOSIS — G8929 Other chronic pain: Secondary | ICD-10-CM | POA: Diagnosis not present

## 2023-01-09 DIAGNOSIS — M1711 Unilateral primary osteoarthritis, right knee: Secondary | ICD-10-CM | POA: Diagnosis not present

## 2023-01-12 DIAGNOSIS — Z741 Need for assistance with personal care: Secondary | ICD-10-CM | POA: Diagnosis not present

## 2023-01-12 DIAGNOSIS — E559 Vitamin D deficiency, unspecified: Secondary | ICD-10-CM | POA: Diagnosis not present

## 2023-01-12 DIAGNOSIS — S32591D Other specified fracture of right pubis, subsequent encounter for fracture with routine healing: Secondary | ICD-10-CM | POA: Diagnosis not present

## 2023-01-12 DIAGNOSIS — M810 Age-related osteoporosis without current pathological fracture: Secondary | ICD-10-CM | POA: Diagnosis not present

## 2023-01-12 DIAGNOSIS — I10 Essential (primary) hypertension: Secondary | ICD-10-CM | POA: Diagnosis not present

## 2023-01-12 DIAGNOSIS — S3210XD Unspecified fracture of sacrum, subsequent encounter for fracture with routine healing: Secondary | ICD-10-CM | POA: Diagnosis not present

## 2023-01-12 DIAGNOSIS — F0393 Unspecified dementia, unspecified severity, with mood disturbance: Secondary | ICD-10-CM | POA: Diagnosis not present

## 2023-01-12 DIAGNOSIS — R4189 Other symptoms and signs involving cognitive functions and awareness: Secondary | ICD-10-CM | POA: Diagnosis not present

## 2023-01-12 DIAGNOSIS — R278 Other lack of coordination: Secondary | ICD-10-CM | POA: Diagnosis not present

## 2023-01-17 DIAGNOSIS — E559 Vitamin D deficiency, unspecified: Secondary | ICD-10-CM | POA: Diagnosis not present

## 2023-01-17 DIAGNOSIS — S3210XD Unspecified fracture of sacrum, subsequent encounter for fracture with routine healing: Secondary | ICD-10-CM | POA: Diagnosis not present

## 2023-01-17 DIAGNOSIS — S32591D Other specified fracture of right pubis, subsequent encounter for fracture with routine healing: Secondary | ICD-10-CM | POA: Diagnosis not present

## 2023-01-17 DIAGNOSIS — M810 Age-related osteoporosis without current pathological fracture: Secondary | ICD-10-CM | POA: Diagnosis not present

## 2023-01-17 DIAGNOSIS — I10 Essential (primary) hypertension: Secondary | ICD-10-CM | POA: Diagnosis not present

## 2023-01-17 DIAGNOSIS — Z741 Need for assistance with personal care: Secondary | ICD-10-CM | POA: Diagnosis not present

## 2023-01-17 DIAGNOSIS — F0393 Unspecified dementia, unspecified severity, with mood disturbance: Secondary | ICD-10-CM | POA: Diagnosis not present

## 2023-01-17 DIAGNOSIS — R4189 Other symptoms and signs involving cognitive functions and awareness: Secondary | ICD-10-CM | POA: Diagnosis not present

## 2023-01-17 DIAGNOSIS — R278 Other lack of coordination: Secondary | ICD-10-CM | POA: Diagnosis not present

## 2023-01-20 DIAGNOSIS — M810 Age-related osteoporosis without current pathological fracture: Secondary | ICD-10-CM | POA: Diagnosis not present

## 2023-01-20 DIAGNOSIS — R278 Other lack of coordination: Secondary | ICD-10-CM | POA: Diagnosis not present

## 2023-01-20 DIAGNOSIS — S3210XD Unspecified fracture of sacrum, subsequent encounter for fracture with routine healing: Secondary | ICD-10-CM | POA: Diagnosis not present

## 2023-01-20 DIAGNOSIS — R4189 Other symptoms and signs involving cognitive functions and awareness: Secondary | ICD-10-CM | POA: Diagnosis not present

## 2023-01-20 DIAGNOSIS — E559 Vitamin D deficiency, unspecified: Secondary | ICD-10-CM | POA: Diagnosis not present

## 2023-01-20 DIAGNOSIS — Z741 Need for assistance with personal care: Secondary | ICD-10-CM | POA: Diagnosis not present

## 2023-01-20 DIAGNOSIS — F0393 Unspecified dementia, unspecified severity, with mood disturbance: Secondary | ICD-10-CM | POA: Diagnosis not present

## 2023-01-20 DIAGNOSIS — S32591D Other specified fracture of right pubis, subsequent encounter for fracture with routine healing: Secondary | ICD-10-CM | POA: Diagnosis not present

## 2023-01-20 DIAGNOSIS — I10 Essential (primary) hypertension: Secondary | ICD-10-CM | POA: Diagnosis not present

## 2023-01-23 DIAGNOSIS — S3210XD Unspecified fracture of sacrum, subsequent encounter for fracture with routine healing: Secondary | ICD-10-CM | POA: Diagnosis not present

## 2023-01-23 DIAGNOSIS — Z741 Need for assistance with personal care: Secondary | ICD-10-CM | POA: Diagnosis not present

## 2023-01-23 DIAGNOSIS — E559 Vitamin D deficiency, unspecified: Secondary | ICD-10-CM | POA: Diagnosis not present

## 2023-01-23 DIAGNOSIS — F0393 Unspecified dementia, unspecified severity, with mood disturbance: Secondary | ICD-10-CM | POA: Diagnosis not present

## 2023-01-23 DIAGNOSIS — R4189 Other symptoms and signs involving cognitive functions and awareness: Secondary | ICD-10-CM | POA: Diagnosis not present

## 2023-01-23 DIAGNOSIS — M810 Age-related osteoporosis without current pathological fracture: Secondary | ICD-10-CM | POA: Diagnosis not present

## 2023-01-23 DIAGNOSIS — R278 Other lack of coordination: Secondary | ICD-10-CM | POA: Diagnosis not present

## 2023-01-23 DIAGNOSIS — I10 Essential (primary) hypertension: Secondary | ICD-10-CM | POA: Diagnosis not present

## 2023-01-23 DIAGNOSIS — S32591D Other specified fracture of right pubis, subsequent encounter for fracture with routine healing: Secondary | ICD-10-CM | POA: Diagnosis not present

## 2023-01-25 DIAGNOSIS — R4189 Other symptoms and signs involving cognitive functions and awareness: Secondary | ICD-10-CM | POA: Diagnosis not present

## 2023-01-25 DIAGNOSIS — F0393 Unspecified dementia, unspecified severity, with mood disturbance: Secondary | ICD-10-CM | POA: Diagnosis not present

## 2023-01-25 DIAGNOSIS — E559 Vitamin D deficiency, unspecified: Secondary | ICD-10-CM | POA: Diagnosis not present

## 2023-01-25 DIAGNOSIS — S32591D Other specified fracture of right pubis, subsequent encounter for fracture with routine healing: Secondary | ICD-10-CM | POA: Diagnosis not present

## 2023-01-25 DIAGNOSIS — S3210XD Unspecified fracture of sacrum, subsequent encounter for fracture with routine healing: Secondary | ICD-10-CM | POA: Diagnosis not present

## 2023-01-25 DIAGNOSIS — M810 Age-related osteoporosis without current pathological fracture: Secondary | ICD-10-CM | POA: Diagnosis not present

## 2023-01-25 DIAGNOSIS — R278 Other lack of coordination: Secondary | ICD-10-CM | POA: Diagnosis not present

## 2023-01-25 DIAGNOSIS — Z741 Need for assistance with personal care: Secondary | ICD-10-CM | POA: Diagnosis not present

## 2023-01-25 DIAGNOSIS — I10 Essential (primary) hypertension: Secondary | ICD-10-CM | POA: Diagnosis not present

## 2023-01-31 DIAGNOSIS — F0393 Unspecified dementia, unspecified severity, with mood disturbance: Secondary | ICD-10-CM | POA: Diagnosis not present

## 2023-01-31 DIAGNOSIS — R278 Other lack of coordination: Secondary | ICD-10-CM | POA: Diagnosis not present

## 2023-01-31 DIAGNOSIS — S3210XD Unspecified fracture of sacrum, subsequent encounter for fracture with routine healing: Secondary | ICD-10-CM | POA: Diagnosis not present

## 2023-01-31 DIAGNOSIS — E559 Vitamin D deficiency, unspecified: Secondary | ICD-10-CM | POA: Diagnosis not present

## 2023-01-31 DIAGNOSIS — R4189 Other symptoms and signs involving cognitive functions and awareness: Secondary | ICD-10-CM | POA: Diagnosis not present

## 2023-01-31 DIAGNOSIS — I10 Essential (primary) hypertension: Secondary | ICD-10-CM | POA: Diagnosis not present

## 2023-01-31 DIAGNOSIS — S32591D Other specified fracture of right pubis, subsequent encounter for fracture with routine healing: Secondary | ICD-10-CM | POA: Diagnosis not present

## 2023-01-31 DIAGNOSIS — Z741 Need for assistance with personal care: Secondary | ICD-10-CM | POA: Diagnosis not present

## 2023-01-31 DIAGNOSIS — M810 Age-related osteoporosis without current pathological fracture: Secondary | ICD-10-CM | POA: Diagnosis not present

## 2023-02-02 DIAGNOSIS — I10 Essential (primary) hypertension: Secondary | ICD-10-CM | POA: Diagnosis not present

## 2023-02-02 DIAGNOSIS — S32591D Other specified fracture of right pubis, subsequent encounter for fracture with routine healing: Secondary | ICD-10-CM | POA: Diagnosis not present

## 2023-02-02 DIAGNOSIS — M810 Age-related osteoporosis without current pathological fracture: Secondary | ICD-10-CM | POA: Diagnosis not present

## 2023-02-02 DIAGNOSIS — F0393 Unspecified dementia, unspecified severity, with mood disturbance: Secondary | ICD-10-CM | POA: Diagnosis not present

## 2023-02-02 DIAGNOSIS — R4189 Other symptoms and signs involving cognitive functions and awareness: Secondary | ICD-10-CM | POA: Diagnosis not present

## 2023-02-02 DIAGNOSIS — S3210XD Unspecified fracture of sacrum, subsequent encounter for fracture with routine healing: Secondary | ICD-10-CM | POA: Diagnosis not present

## 2023-02-02 DIAGNOSIS — R278 Other lack of coordination: Secondary | ICD-10-CM | POA: Diagnosis not present

## 2023-02-02 DIAGNOSIS — Z741 Need for assistance with personal care: Secondary | ICD-10-CM | POA: Diagnosis not present

## 2023-02-02 DIAGNOSIS — E559 Vitamin D deficiency, unspecified: Secondary | ICD-10-CM | POA: Diagnosis not present

## 2023-02-07 DIAGNOSIS — I10 Essential (primary) hypertension: Secondary | ICD-10-CM | POA: Diagnosis not present

## 2023-02-07 DIAGNOSIS — M810 Age-related osteoporosis without current pathological fracture: Secondary | ICD-10-CM | POA: Diagnosis not present

## 2023-02-07 DIAGNOSIS — Z741 Need for assistance with personal care: Secondary | ICD-10-CM | POA: Diagnosis not present

## 2023-02-07 DIAGNOSIS — F0393 Unspecified dementia, unspecified severity, with mood disturbance: Secondary | ICD-10-CM | POA: Diagnosis not present

## 2023-02-07 DIAGNOSIS — E559 Vitamin D deficiency, unspecified: Secondary | ICD-10-CM | POA: Diagnosis not present

## 2023-02-07 DIAGNOSIS — S3210XD Unspecified fracture of sacrum, subsequent encounter for fracture with routine healing: Secondary | ICD-10-CM | POA: Diagnosis not present

## 2023-02-07 DIAGNOSIS — S32591D Other specified fracture of right pubis, subsequent encounter for fracture with routine healing: Secondary | ICD-10-CM | POA: Diagnosis not present

## 2023-02-07 DIAGNOSIS — R278 Other lack of coordination: Secondary | ICD-10-CM | POA: Diagnosis not present

## 2023-02-07 DIAGNOSIS — R4189 Other symptoms and signs involving cognitive functions and awareness: Secondary | ICD-10-CM | POA: Diagnosis not present

## 2023-02-08 ENCOUNTER — Ambulatory Visit: Payer: Self-pay | Admitting: Family Medicine

## 2023-02-08 ENCOUNTER — Telehealth: Payer: Self-pay | Admitting: Family Medicine

## 2023-02-08 NOTE — Telephone Encounter (Signed)
  Chief Complaint: Diarrhea Symptoms: Black tarry diarrhea Frequency: Ongoing for about 6 weeks Pertinent Negatives: Patient denies fever, dysuria Disposition: [x] ED /[] Urgent Care (no appt availability in office) / [] Appointment(In office/virtual)/ []  [facility] [facility]/ [] Home Care/ [] Refused Recommended Disposition /[] [place] [facility]/ []  Follow-up with PCP Additional Notes: Pt reports she has been experiencing ongoing diarrhea for about 6 weeks, the stools have been black and watery since they began. Pt denies fever, N/V, abd pain or urinary symptoms. Pt reports she does not take any OTC iron supplements. Pt has appt scheduled for tomorrow PM that was scheduled prior to triage. This RN advised pt she needs to be seen in ED now. Pt reports she does not have transportation, offered to call EMS for transport, pt continues to decline as this is "too expensive". This RN educated pt on home care, the importance of being evaluated and treated and when to call back, new-worsening symptoms. Pt verbalized understanding and agrees to plan. CAL notified of refusal. Forwarding to provider for review.   Reason for Disposition  Black or tarry bowel movements  (Exception: Chronic-unchanged black-grey BMs AND is taking iron pills or Pepto-Bismol.)  Answer Assessment - Initial Assessment Questions 1. DIARRHEA SEVERITY: "How bad is the diarrhea?" "How many more stools have you had in the past 24 hours than normal?"    - NO DIARRHEA (SCALE 0)   - MILD (SCALE 1-3): Few loose or mushy BMs; increase of 1-3 stools over normal daily number of stools; mild increase in ostomy output.   -  MODERATE (SCALE 4-7): Increase of 4-6 stools daily over normal; moderate increase in ostomy output.   -  SEVERE (SCALE 8-10; OR "WORST POSSIBLE"): Increase of 7 or more stools daily over normal; moderate increase in ostomy output; incontinence.     About 6 episodes a day 2. ONSET: "When did the diarrhea begin?"      Off and  on for several months, gotten worse 3. BM CONSISTENCY: "How loose or watery is the diarrhea?"      Watery 4. VOMITING: "Are you also vomiting?" If Yes, ask: "How many times in the past 24 hours?"      None  8. HYDRATION: "Any signs of dehydration?" (e.g., dry mouth [not just dry lips], too weak to stand, dizziness, new weight loss) "When did you last urinate?"     None 10. ANTIBIOTIC USE: "Are you taking antibiotics now or have you taken antibiotics in the past 2 months?"       None 11. OTHER SYMPTOMS: "Do you have any other symptoms?" (e.g., fever, blood in stool)       Stools are tarry black  Protocols used: Diarrhea-A-AH

## 2023-02-08 NOTE — Telephone Encounter (Signed)
 Triage nurse  called too inform the provider that the pt declined to go to the ER and pt has a appointment tomorrow here in the office

## 2023-02-08 NOTE — Telephone Encounter (Signed)
 Noted appt in office tommorow is acceptable. Per note appears darker  loose stools have been going on for 6 weeks or greater and she is taking Pepto-Bismol and iron.  Darker color of stool is not clearly from blood.

## 2023-02-09 ENCOUNTER — Ambulatory Visit: Payer: Medicare PPO | Admitting: Family Medicine

## 2023-02-09 VITALS — BP 120/60 | HR 67 | Temp 98.6°F | Ht 64.0 in | Wt 107.2 lb

## 2023-02-09 DIAGNOSIS — K529 Noninfective gastroenteritis and colitis, unspecified: Secondary | ICD-10-CM

## 2023-02-09 MED ORDER — CLONAZEPAM 0.5 MG PO TABS: 0.2500 mg | ORAL_TABLET | Freq: Two times a day (BID) | ORAL | 0 refills | Status: DC | PRN

## 2023-02-09 NOTE — Patient Instructions (Addendum)
Can try align or culturelle.  We will refer to GI. Keep up with liquids.  We will call with lab results.   Can try questran for diarrhea if  evidence of infection.

## 2023-02-09 NOTE — Progress Notes (Signed)
Patient ID: Catherine Young, female    DOB: [birth year], 89 y.o.   MRN: [id number]  This visit was conducted in person.  BP 120/60 (BP Location: Left Arm, Patient Position: Sitting, Cuff Size: Normal)   Pulse 67   Temp 98.6 F (37 C) (Temporal)   Ht 5\' 4"  (1.626 m)   Wt 107 lb 4 oz (48.6 kg)   SpO2 95%   BMI 18.41 kg/m    CC:  Chief Complaint  Patient presents with   Diarrhea    X 6 weeks    Subjective:   HPI: Catherine Young is a 89 y.o. female presenting on 02/09/2023 for Diarrhea (X 6 weeks)  Has history of chronic diarrhea.. discussed last in 07/2022... felt possible IBS as she fluctuates between constipation and diarrhea.. recommended fiber and probiotic Now in last 6 week she has had a worsening of diarrhea  [place] diarrhea 6 times a day, no blood in stool.. darker black color... on peto bismol.  No abdominal pain.  No fever.  Eating regularly.Marland Kitchen trying to eat gentle... no chang ein diarrhea with different foods.   Not using miralax or magnesium.  No new meds.   No trip, no creek water.    Wt Readings from Last 3 Encounters:  02/09/23 107 lb 4 oz (48.6 kg)  12/28/22 111 lb 9.6 oz (50.6 kg)  12/06/22 110 lb 6.4 oz (50.1 kg)    Colonoscopy 2012   Relevant past medical, surgical, family and social history reviewed and updated as indicated. Interim medical history since our last visit reviewed. Allergies and medications reviewed and updated. Outpatient Medications Prior to Visit  Medication Sig Dispense Refill   acetaminophen (TYLENOL) 500 MG tablet Take 1,000 mg by mouth 3 (three) times daily as needed. Give Two tablets by mouth twice daily.     amLODipine (NORVASC) 5 MG tablet Take 1 tablet (5 mg total) by mouth daily. 30 tablet 0   bismuth subsalicylate (PEPTO BISMOL) 262 MG/15ML suspension Take 30 mLs by mouth as needed.     celecoxib (CELEBREX) 200 MG capsule Take 1 capsule (200 mg total) by mouth 2 (two) times daily. 60 capsule 0   donepezil  (ARICEPT) 10 MG tablet Take 1 tablet (10 mg total) by mouth at bedtime. 90 tablet 3   gabapentin (NEURONTIN) 100 MG capsule Take 1 capsule (100 mg total) by mouth 3 (three) times daily as needed. 90 capsule 1   polyethylene glycol (MIRALAX / GLYCOLAX) 17 g packet Take 17 g by mouth daily as needed. 30 each 0   sertraline (ZOLOFT) 100 MG tablet TAKE 1 TABLET(100 MG) BY MOUTH AT BEDTIME 90 tablet 0   traZODone (DESYREL) 100 MG tablet TAKE 1 TABLET(50 MG) BY MOUTH AT BEDTIME AS NEEDED FOR SLEEP 30 tablet 3   Vitamin D, Ergocalciferol, (DRISDOL) 1.25 MG (50000 UNIT) CAPS capsule Take 1 capsule (50,000 Units total) by mouth every 7 (seven) days. 12 capsule 0   carbamide peroxide (DEBROX) 6.5 % OTIC solution Place 5 drops into both ears as needed.     cetirizine (ZYRTEC) 5 MG tablet Take 5 mg by mouth daily as needed for allergies.     clonazePAM (KLONOPIN) 0.5 MG tablet Take 0.5 tablets (0.25 mg total) by mouth 2 (two) times daily as needed for anxiety. (Patient not taking: Reported on 02/09/2023) 60 tablet 0   dextromethorphan-guaiFENesin (ROBITUSSIN-DM) 10-100 MG/5ML liquid Take 5 mLs by mouth every 4 (four) hours as needed for cough.  Glucose 15 GM/32ML GEL Take by mouth. Give one packet by mouth as needed.     magnesium hydroxide (MILK OF MAGNESIA) 400 MG/5ML suspension Take 5 mLs by mouth daily as needed for mild constipation.     nystatin (MYCOSTATIN/NYSTOP) powder Apply 1 Application topically 2 (two) times daily.     ondansetron (ZOFRAN) 8 MG tablet Take 8 mg by mouth every 8 (eight) hours as needed for nausea or vomiting.     OXYGEN 2 lpm for dyspnea or SOB     Zinc Oxide (TRIPLE PASTE) 12.8 % ointment Apply 1 Application topically. Every shift.     No facility-administered medications prior to visit.     Per HPI unless specifically indicated in ROS section below Review of Systems  Constitutional:  Negative for fatigue and fever.  HENT:  Negative for congestion.   Eyes:  Negative for  pain.  Respiratory:  Negative for cough and shortness of breath.   Cardiovascular:  Negative for chest pain, palpitations and leg swelling.  Gastrointestinal:  Positive for diarrhea. Negative for abdominal pain.  Genitourinary:  Negative for dysuria and vaginal bleeding.  Musculoskeletal:  Negative for back pain.  Neurological:  Negative for syncope, light-headedness and headaches.  Psychiatric/Behavioral:  Negative for dysphoric mood.    Objective:  BP 120/60 (BP Location: Left Arm, Patient Position: Sitting, Cuff Size: Normal)   Pulse 67   Temp 98.6 F (37 C) (Temporal)   Ht 5\' 4"  (1.626 m)   Wt 107 lb 4 oz (48.6 kg)   SpO2 95%   BMI 18.41 kg/m   Wt Readings from Last 3 Encounters:  02/09/23 107 lb 4 oz (48.6 kg)  12/28/22 111 lb 9.6 oz (50.6 kg)  12/06/22 110 lb 6.4 oz (50.1 kg)      Physical Exam Constitutional:      General: She is not in acute distress.    Appearance: Normal appearance. She is well-developed and underweight. She is not ill-appearing or toxic-appearing.  HENT:     Head: Normocephalic.     Right Ear: Hearing, tympanic membrane, ear canal and external ear normal. Tympanic membrane is not erythematous, retracted or bulging.     Left Ear: Hearing, tympanic membrane, ear canal and external ear normal. Tympanic membrane is not erythematous, retracted or bulging.     Nose: No mucosal edema or rhinorrhea.     Right Sinus: No maxillary sinus tenderness or frontal sinus tenderness.     Left Sinus: No maxillary sinus tenderness or frontal sinus tenderness.     Mouth/Throat:     Pharynx: Uvula midline.  Eyes:     General: Lids are normal. Lids are everted, no foreign bodies appreciated.     Conjunctiva/sclera: Conjunctivae normal.     Pupils: Pupils are equal, round, and reactive to light.  Neck:     Thyroid: No thyroid mass or thyromegaly.     Vascular: No carotid bruit.     Trachea: Trachea normal.  Cardiovascular:     Rate and Rhythm: Normal rate and  regular rhythm.     Pulses: Normal pulses.     Heart sounds: Normal heart sounds, S1 normal and S2 normal. No murmur heard.    No friction rub. No gallop.  Pulmonary:     Effort: Pulmonary effort is normal. No tachypnea or respiratory distress.     Breath sounds: Normal breath sounds. No decreased breath sounds, wheezing, rhonchi or rales.  Abdominal:     General: Bowel sounds are normal.  Palpations: Abdomen is soft.     Tenderness: There is no abdominal tenderness.  Musculoskeletal:     Cervical back: Normal range of motion and neck supple.  Skin:    General: Skin is warm and dry.     Findings: No rash.  Neurological:     Mental Status: She is alert.  Psychiatric:        Mood and Affect: Mood is not anxious or depressed.        Speech: Speech normal.        Behavior: Behavior normal. Behavior is cooperative.        Thought Content: Thought content normal.        Judgment: Judgment normal.       Results for orders placed or performed in visit on 02/09/23  Comprehensive metabolic panel   Collection Time: 02/09/23  3:51 PM  Result Value Ref Range   Sodium 128 (L) 135 - 145 mEq/L   Potassium 4.5 3.5 - 5.1 mEq/L   Chloride 94 (L) 96 - 112 mEq/L   CO2 28 19 - 32 mEq/L   Glucose, Bld 86 70 - 99 mg/dL   BUN 21 6 - 23 mg/dL   Creatinine, Ser 6.57 0.40 - 1.20 mg/dL   Total Bilirubin 0.4 0.2 - 1.2 mg/dL   Alkaline Phosphatase 58 39 - 117 U/L   AST 17 0 - 37 U/L   ALT 9 0 - 35 U/L   Total Protein 7.2 6.0 - 8.3 g/dL   Albumin 3.8 3.5 - 5.2 g/dL   GFR 84.69 (L) >62.95 mL/min   Calcium 9.2 8.4 - 10.5 mg/dL  TSH   Collection Time: 02/09/23  3:51 PM  Result Value Ref Range   TSH 0.90 0.35 - 5.50 uIU/mL  CBC with Differential/Platelet   Collection Time: 02/09/23  3:51 PM  Result Value Ref Range   WBC 6.2 4.0 - 10.5 K/uL   RBC 3.54 (L) 3.87 - 5.11 Mil/uL   Hemoglobin 11.5 (L) 12.0 - 15.0 g/dL   HCT 28.4 (L) 13.2 - 44.0 %   MCV 98.6 78.0 - 100.0 fl   MCHC 32.8 30.0 - 36.0  g/dL   RDW 10.2 72.5 - 36.6 %   Platelets 360.0 150.0 - 400.0 K/uL   Neutrophils Relative % 59.3 43.0 - 77.0 %   Lymphocytes Relative 28.9 12.0 - 46.0 %   Monocytes Relative 8.7 3.0 - 12.0 %   Eosinophils Relative 2.4 0.0 - 5.0 %   Basophils Relative 0.7 0.0 - 3.0 %   Neutro Abs 3.7 1.4 - 7.7 K/uL   Lymphs Abs 1.8 0.7 - 4.0 K/uL   Monocytes Absolute 0.5 0.1 - 1.0 K/uL   Eosinophils Absolute 0.2 0.0 - 0.7 K/uL   Basophils Absolute 0.0 0.0 - 0.1 K/uL    Assessment and Plan  Chronic diarrhea Assessment & Plan: Acute worsening of chronic issue.  Possible IBS but will evaluate for C. difficile and GI pathogen panel given she lives in assisted living. Can try align or culturelle. Will move forward with GI referral. Keep up with liquids, no current evidence for dehydration  Can try questran for diarrhea if  evidence of infection.  Addendum February 16, 2023 unfortunately sample was not sent we will have to recollect stool sample.  Orders: -     Ambulatory referral to Gastroenterology -     Comprehensive metabolic panel -     TSH -     CBC with Differential/Platelet -  Gastrointestinal Pathogen Pnl RT, PCR; Future -     C. difficile GDH and Toxin A/B; Future  Other orders -     clonazePAM; Take 0.5 tablets (0.25 mg total) by mouth 2 (two) times daily as needed for anxiety.  Dispense: 30 tablet; Refill: 0    No follow-ups on file.   Kerby Nora, MD

## 2023-02-10 ENCOUNTER — Other Ambulatory Visit: Payer: Self-pay | Admitting: Family Medicine

## 2023-02-10 ENCOUNTER — Other Ambulatory Visit: Payer: Self-pay

## 2023-02-10 ENCOUNTER — Encounter: Payer: Self-pay | Admitting: *Deleted

## 2023-02-10 DIAGNOSIS — E871 Hypo-osmolality and hyponatremia: Secondary | ICD-10-CM

## 2023-02-10 DIAGNOSIS — K529 Noninfective gastroenteritis and colitis, unspecified: Secondary | ICD-10-CM

## 2023-02-10 LAB — CBC WITH DIFFERENTIAL/PLATELET
Basophils Absolute: 0 10*3/uL (ref 0.0–0.1)
Basophils Relative: 0.7 % (ref 0.0–3.0)
Eosinophils Absolute: 0.2 10*3/uL (ref 0.0–0.7)
Eosinophils Relative: 2.4 % (ref 0.0–5.0)
HCT: 34.9 % — ABNORMAL LOW (ref 36.0–46.0)
Hemoglobin: 11.5 g/dL — ABNORMAL LOW (ref 12.0–15.0)
Lymphocytes Relative: 28.9 % (ref 12.0–46.0)
Lymphs Abs: 1.8 10*3/uL (ref 0.7–4.0)
MCHC: 32.8 g/dL (ref 30.0–36.0)
MCV: 98.6 fL (ref 78.0–100.0)
Monocytes Absolute: 0.5 10*3/uL (ref 0.1–1.0)
Monocytes Relative: 8.7 % (ref 3.0–12.0)
Neutro Abs: 3.7 10*3/uL (ref 1.4–7.7)
Neutrophils Relative %: 59.3 % (ref 43.0–77.0)
Platelets: 360 10*3/uL (ref 150.0–400.0)
RBC: 3.54 Mil/uL — ABNORMAL LOW (ref 3.87–5.11)
RDW: 14.1 % (ref 11.5–15.5)
WBC: 6.2 10*3/uL (ref 4.0–10.5)

## 2023-02-10 LAB — COMPREHENSIVE METABOLIC PANEL
ALT: 9 U/L (ref 0–35)
AST: 17 U/L (ref 0–37)
Albumin: 3.8 g/dL (ref 3.5–5.2)
Alkaline Phosphatase: 58 U/L (ref 39–117)
BUN: 21 mg/dL (ref 6–23)
CO2: 28 meq/L (ref 19–32)
Calcium: 9.2 mg/dL (ref 8.4–10.5)
Chloride: 94 meq/L — ABNORMAL LOW (ref 96–112)
Creatinine, Ser: 1.02 mg/dL (ref 0.40–1.20)
GFR: 48.84 mL/min — ABNORMAL LOW (ref >60.00)
Glucose, Bld: 86 mg/dL (ref 70–99)
Potassium: 4.5 meq/L (ref 3.5–5.1)
Sodium: 128 meq/L — ABNORMAL LOW (ref 135–145)
Total Bilirubin: 0.4 mg/dL (ref 0.2–1.2)
Total Protein: 7.2 g/dL (ref 6.0–8.3)

## 2023-02-10 LAB — TSH: TSH: 0.9 u[IU]/mL (ref 0.35–5.50)

## 2023-02-13 ENCOUNTER — Other Ambulatory Visit: Payer: Medicare PPO

## 2023-02-13 ENCOUNTER — Other Ambulatory Visit (INDEPENDENT_AMBULATORY_CARE_PROVIDER_SITE_OTHER): Payer: Medicare PPO

## 2023-02-13 DIAGNOSIS — E871 Hypo-osmolality and hyponatremia: Secondary | ICD-10-CM | POA: Diagnosis not present

## 2023-02-13 DIAGNOSIS — K529 Noninfective gastroenteritis and colitis, unspecified: Secondary | ICD-10-CM

## 2023-02-13 LAB — GASTROINTESTINAL PATHOGEN PNL

## 2023-02-14 ENCOUNTER — Other Ambulatory Visit: Payer: Medicare PPO

## 2023-02-14 ENCOUNTER — Encounter: Payer: Self-pay | Admitting: Family Medicine

## 2023-02-14 ENCOUNTER — Telehealth: Payer: Self-pay

## 2023-02-14 DIAGNOSIS — M810 Age-related osteoporosis without current pathological fracture: Secondary | ICD-10-CM | POA: Diagnosis not present

## 2023-02-14 DIAGNOSIS — E559 Vitamin D deficiency, unspecified: Secondary | ICD-10-CM | POA: Diagnosis not present

## 2023-02-14 DIAGNOSIS — R4189 Other symptoms and signs involving cognitive functions and awareness: Secondary | ICD-10-CM | POA: Diagnosis not present

## 2023-02-14 DIAGNOSIS — F0393 Unspecified dementia, unspecified severity, with mood disturbance: Secondary | ICD-10-CM | POA: Diagnosis not present

## 2023-02-14 DIAGNOSIS — R278 Other lack of coordination: Secondary | ICD-10-CM | POA: Diagnosis not present

## 2023-02-14 DIAGNOSIS — S32591D Other specified fracture of right pubis, subsequent encounter for fracture with routine healing: Secondary | ICD-10-CM | POA: Diagnosis not present

## 2023-02-14 DIAGNOSIS — I10 Essential (primary) hypertension: Secondary | ICD-10-CM | POA: Diagnosis not present

## 2023-02-14 DIAGNOSIS — S3210XD Unspecified fracture of sacrum, subsequent encounter for fracture with routine healing: Secondary | ICD-10-CM | POA: Diagnosis not present

## 2023-02-14 DIAGNOSIS — Z741 Need for assistance with personal care: Secondary | ICD-10-CM | POA: Diagnosis not present

## 2023-02-14 LAB — BASIC METABOLIC PANEL
BUN: 17 mg/dL (ref 6–23)
CO2: 32 meq/L (ref 19–32)
Calcium: 9.3 mg/dL (ref 8.4–10.5)
Chloride: 100 meq/L (ref 96–112)
Creatinine, Ser: 0.96 mg/dL (ref 0.40–1.20)
GFR: 52.52 mL/min — ABNORMAL LOW (ref >60.00)
Glucose, Bld: 116 mg/dL — ABNORMAL HIGH (ref 70–99)
Potassium: 4.2 meq/L (ref 3.5–5.1)
Sodium: 139 meq/L (ref 135–145)

## 2023-02-14 NOTE — Telephone Encounter (Signed)
Reached out to patient to let her know that her stool sample was not picked up and that she will need to return to pick up new containers.

## 2023-02-14 NOTE — Addendum Note (Signed)
Addended by: Alvina Chou on: 02/14/2023 09:32 AM   Modules accepted: Orders

## 2023-02-16 ENCOUNTER — Telehealth: Payer: Self-pay

## 2023-02-16 ENCOUNTER — Other Ambulatory Visit: Payer: Self-pay

## 2023-02-16 DIAGNOSIS — R197 Diarrhea, unspecified: Secondary | ICD-10-CM | POA: Diagnosis not present

## 2023-02-16 DIAGNOSIS — F0393 Unspecified dementia, unspecified severity, with mood disturbance: Secondary | ICD-10-CM | POA: Diagnosis not present

## 2023-02-16 DIAGNOSIS — I10 Essential (primary) hypertension: Secondary | ICD-10-CM | POA: Diagnosis not present

## 2023-02-16 DIAGNOSIS — E559 Vitamin D deficiency, unspecified: Secondary | ICD-10-CM | POA: Diagnosis not present

## 2023-02-16 DIAGNOSIS — Z741 Need for assistance with personal care: Secondary | ICD-10-CM | POA: Diagnosis not present

## 2023-02-16 DIAGNOSIS — S3210XD Unspecified fracture of sacrum, subsequent encounter for fracture with routine healing: Secondary | ICD-10-CM | POA: Diagnosis not present

## 2023-02-16 DIAGNOSIS — S32591D Other specified fracture of right pubis, subsequent encounter for fracture with routine healing: Secondary | ICD-10-CM | POA: Diagnosis not present

## 2023-02-16 DIAGNOSIS — R278 Other lack of coordination: Secondary | ICD-10-CM | POA: Diagnosis not present

## 2023-02-16 DIAGNOSIS — R4189 Other symptoms and signs involving cognitive functions and awareness: Secondary | ICD-10-CM | POA: Diagnosis not present

## 2023-02-16 DIAGNOSIS — M810 Age-related osteoporosis without current pathological fracture: Secondary | ICD-10-CM | POA: Diagnosis not present

## 2023-02-16 DIAGNOSIS — K529 Noninfective gastroenteritis and colitis, unspecified: Secondary | ICD-10-CM | POA: Diagnosis not present

## 2023-02-16 NOTE — Telephone Encounter (Signed)
Copied from CRM [id number]. Topic: General - Other >> Feb 16, 2023  1:18 PM Truddie Crumble wrote: Reason for CRM: pt is returning a call to Lupita Leash from the office   Did not see open message or results? Looks like it was all addressed on My Chart

## 2023-02-16 NOTE — Telephone Encounter (Signed)
Spoke with Ms. Vonita Moss.  She just wanted to confirm where her son is suppose to drop off the stool samples at.  Instructions provided about the white step on can in the foyer.  I also let her know that if these samples come back negative,  there is a medication Dr. Ermalene Searing can give her to help with her chronic diarrhea.  Patient states understanding.

## 2023-02-16 NOTE — Assessment & Plan Note (Signed)
Acute worsening of chronic issue.  Possible IBS but will evaluate for C. difficile and GI pathogen panel given she lives in assisted living. Can try align or culturelle. Will move forward with GI referral. Keep up with liquids, no current evidence for dehydration  Can try questran for diarrhea if  evidence of infection.  Addendum February 16, 2023 unfortunately sample was not sent we will have to recollect stool sample.

## 2023-02-17 ENCOUNTER — Encounter: Payer: Self-pay | Admitting: Family Medicine

## 2023-02-18 LAB — GASTROINTESTINAL PATHOGEN PNL
CampyloBacter Group: NOT DETECTED
Norovirus GI/GII: NOT DETECTED
Rotavirus A: NOT DETECTED
Salmonella species: NOT DETECTED
Shiga Toxin 1: NOT DETECTED
Shiga Toxin 2: NOT DETECTED
Shigella Species: NOT DETECTED
Vibrio Group: NOT DETECTED
Yersinia enterocolitica: NOT DETECTED

## 2023-02-18 LAB — C. DIFFICILE GDH AND TOXIN A/B
GDH ANTIGEN: NOT DETECTED
MICRO NUMBER:: 15989311
SPECIMEN QUALITY:: ADEQUATE
TOXIN A AND B: NOT DETECTED

## 2023-02-21 DIAGNOSIS — R4189 Other symptoms and signs involving cognitive functions and awareness: Secondary | ICD-10-CM | POA: Diagnosis not present

## 2023-02-21 DIAGNOSIS — F0393 Unspecified dementia, unspecified severity, with mood disturbance: Secondary | ICD-10-CM | POA: Diagnosis not present

## 2023-02-21 DIAGNOSIS — S32591D Other specified fracture of right pubis, subsequent encounter for fracture with routine healing: Secondary | ICD-10-CM | POA: Diagnosis not present

## 2023-02-21 DIAGNOSIS — R278 Other lack of coordination: Secondary | ICD-10-CM | POA: Diagnosis not present

## 2023-02-21 DIAGNOSIS — S3210XD Unspecified fracture of sacrum, subsequent encounter for fracture with routine healing: Secondary | ICD-10-CM | POA: Diagnosis not present

## 2023-02-21 DIAGNOSIS — Z741 Need for assistance with personal care: Secondary | ICD-10-CM | POA: Diagnosis not present

## 2023-02-21 DIAGNOSIS — M810 Age-related osteoporosis without current pathological fracture: Secondary | ICD-10-CM | POA: Diagnosis not present

## 2023-02-21 DIAGNOSIS — I10 Essential (primary) hypertension: Secondary | ICD-10-CM | POA: Diagnosis not present

## 2023-02-21 DIAGNOSIS — E559 Vitamin D deficiency, unspecified: Secondary | ICD-10-CM | POA: Diagnosis not present

## 2023-02-21 NOTE — Progress Notes (Signed)
Noted, no further recommendations if symptoms have resolved.

## 2023-02-23 DIAGNOSIS — M810 Age-related osteoporosis without current pathological fracture: Secondary | ICD-10-CM | POA: Diagnosis not present

## 2023-02-23 DIAGNOSIS — I10 Essential (primary) hypertension: Secondary | ICD-10-CM | POA: Diagnosis not present

## 2023-02-23 DIAGNOSIS — R4189 Other symptoms and signs involving cognitive functions and awareness: Secondary | ICD-10-CM | POA: Diagnosis not present

## 2023-02-23 DIAGNOSIS — S3210XD Unspecified fracture of sacrum, subsequent encounter for fracture with routine healing: Secondary | ICD-10-CM | POA: Diagnosis not present

## 2023-02-23 DIAGNOSIS — Z741 Need for assistance with personal care: Secondary | ICD-10-CM | POA: Diagnosis not present

## 2023-02-23 DIAGNOSIS — S32591D Other specified fracture of right pubis, subsequent encounter for fracture with routine healing: Secondary | ICD-10-CM | POA: Diagnosis not present

## 2023-02-23 DIAGNOSIS — R278 Other lack of coordination: Secondary | ICD-10-CM | POA: Diagnosis not present

## 2023-02-23 DIAGNOSIS — F0393 Unspecified dementia, unspecified severity, with mood disturbance: Secondary | ICD-10-CM | POA: Diagnosis not present

## 2023-02-23 DIAGNOSIS — E559 Vitamin D deficiency, unspecified: Secondary | ICD-10-CM | POA: Diagnosis not present

## 2023-02-28 DIAGNOSIS — I10 Essential (primary) hypertension: Secondary | ICD-10-CM | POA: Diagnosis not present

## 2023-02-28 DIAGNOSIS — E559 Vitamin D deficiency, unspecified: Secondary | ICD-10-CM | POA: Diagnosis not present

## 2023-02-28 DIAGNOSIS — S32591D Other specified fracture of right pubis, subsequent encounter for fracture with routine healing: Secondary | ICD-10-CM | POA: Diagnosis not present

## 2023-02-28 DIAGNOSIS — M810 Age-related osteoporosis without current pathological fracture: Secondary | ICD-10-CM | POA: Diagnosis not present

## 2023-02-28 DIAGNOSIS — Z741 Need for assistance with personal care: Secondary | ICD-10-CM | POA: Diagnosis not present

## 2023-02-28 DIAGNOSIS — F0393 Unspecified dementia, unspecified severity, with mood disturbance: Secondary | ICD-10-CM | POA: Diagnosis not present

## 2023-02-28 DIAGNOSIS — S3210XD Unspecified fracture of sacrum, subsequent encounter for fracture with routine healing: Secondary | ICD-10-CM | POA: Diagnosis not present

## 2023-02-28 DIAGNOSIS — R4189 Other symptoms and signs involving cognitive functions and awareness: Secondary | ICD-10-CM | POA: Diagnosis not present

## 2023-02-28 DIAGNOSIS — R278 Other lack of coordination: Secondary | ICD-10-CM | POA: Diagnosis not present

## 2023-03-02 DIAGNOSIS — E559 Vitamin D deficiency, unspecified: Secondary | ICD-10-CM | POA: Diagnosis not present

## 2023-03-02 DIAGNOSIS — M810 Age-related osteoporosis without current pathological fracture: Secondary | ICD-10-CM | POA: Diagnosis not present

## 2023-03-02 DIAGNOSIS — I10 Essential (primary) hypertension: Secondary | ICD-10-CM | POA: Diagnosis not present

## 2023-03-02 DIAGNOSIS — R278 Other lack of coordination: Secondary | ICD-10-CM | POA: Diagnosis not present

## 2023-03-02 DIAGNOSIS — S3210XD Unspecified fracture of sacrum, subsequent encounter for fracture with routine healing: Secondary | ICD-10-CM | POA: Diagnosis not present

## 2023-03-02 DIAGNOSIS — R4189 Other symptoms and signs involving cognitive functions and awareness: Secondary | ICD-10-CM | POA: Diagnosis not present

## 2023-03-02 DIAGNOSIS — S32591D Other specified fracture of right pubis, subsequent encounter for fracture with routine healing: Secondary | ICD-10-CM | POA: Diagnosis not present

## 2023-03-02 DIAGNOSIS — F0393 Unspecified dementia, unspecified severity, with mood disturbance: Secondary | ICD-10-CM | POA: Diagnosis not present

## 2023-03-02 DIAGNOSIS — Z741 Need for assistance with personal care: Secondary | ICD-10-CM | POA: Diagnosis not present

## 2023-03-07 DIAGNOSIS — M810 Age-related osteoporosis without current pathological fracture: Secondary | ICD-10-CM | POA: Diagnosis not present

## 2023-03-07 DIAGNOSIS — I10 Essential (primary) hypertension: Secondary | ICD-10-CM | POA: Diagnosis not present

## 2023-03-07 DIAGNOSIS — F0393 Unspecified dementia, unspecified severity, with mood disturbance: Secondary | ICD-10-CM | POA: Diagnosis not present

## 2023-03-07 DIAGNOSIS — E559 Vitamin D deficiency, unspecified: Secondary | ICD-10-CM | POA: Diagnosis not present

## 2023-03-07 DIAGNOSIS — Z741 Need for assistance with personal care: Secondary | ICD-10-CM | POA: Diagnosis not present

## 2023-03-07 DIAGNOSIS — R278 Other lack of coordination: Secondary | ICD-10-CM | POA: Diagnosis not present

## 2023-03-07 DIAGNOSIS — S32591D Other specified fracture of right pubis, subsequent encounter for fracture with routine healing: Secondary | ICD-10-CM | POA: Diagnosis not present

## 2023-03-07 DIAGNOSIS — S3210XD Unspecified fracture of sacrum, subsequent encounter for fracture with routine healing: Secondary | ICD-10-CM | POA: Diagnosis not present

## 2023-03-07 DIAGNOSIS — R4189 Other symptoms and signs involving cognitive functions and awareness: Secondary | ICD-10-CM | POA: Diagnosis not present

## 2023-03-09 DIAGNOSIS — E559 Vitamin D deficiency, unspecified: Secondary | ICD-10-CM | POA: Diagnosis not present

## 2023-03-09 DIAGNOSIS — I10 Essential (primary) hypertension: Secondary | ICD-10-CM | POA: Diagnosis not present

## 2023-03-09 DIAGNOSIS — S3210XD Unspecified fracture of sacrum, subsequent encounter for fracture with routine healing: Secondary | ICD-10-CM | POA: Diagnosis not present

## 2023-03-09 DIAGNOSIS — M810 Age-related osteoporosis without current pathological fracture: Secondary | ICD-10-CM | POA: Diagnosis not present

## 2023-03-09 DIAGNOSIS — R4189 Other symptoms and signs involving cognitive functions and awareness: Secondary | ICD-10-CM | POA: Diagnosis not present

## 2023-03-09 DIAGNOSIS — S32591D Other specified fracture of right pubis, subsequent encounter for fracture with routine healing: Secondary | ICD-10-CM | POA: Diagnosis not present

## 2023-03-09 DIAGNOSIS — Z741 Need for assistance with personal care: Secondary | ICD-10-CM | POA: Diagnosis not present

## 2023-03-09 DIAGNOSIS — R278 Other lack of coordination: Secondary | ICD-10-CM | POA: Diagnosis not present

## 2023-03-09 DIAGNOSIS — F0393 Unspecified dementia, unspecified severity, with mood disturbance: Secondary | ICD-10-CM | POA: Diagnosis not present

## 2023-04-11 ENCOUNTER — Encounter: Payer: Self-pay | Admitting: Family Medicine

## 2023-04-11 ENCOUNTER — Telehealth (INDEPENDENT_AMBULATORY_CARE_PROVIDER_SITE_OTHER): Admitting: Family Medicine

## 2023-04-11 VITALS — Ht 64.0 in

## 2023-04-11 DIAGNOSIS — K529 Noninfective gastroenteritis and colitis, unspecified: Secondary | ICD-10-CM | POA: Diagnosis not present

## 2023-04-11 DIAGNOSIS — F5104 Psychophysiologic insomnia: Secondary | ICD-10-CM

## 2023-04-11 MED ORDER — CHOLESTYRAMINE LIGHT 4 G PO POWD: 4.0000 g | Freq: Three times a day (TID) | ORAL | 3 refills | Status: DC | PRN

## 2023-04-11 MED ORDER — MIRTAZAPINE 15 MG PO TABS: 15.0000 mg | ORAL_TABLET | Freq: Every day | ORAL | 0 refills | Status: DC

## 2023-04-11 NOTE — Patient Instructions (Addendum)
 Call [facility] to make appt for GI. Can use Questran up to 3 times a day but if you are beginning to get constipated please stop. Add Remeron 15 mg at bedtime to help with sleep.  Continue sertraline and trazodone. Follow-up in 2 weeks for reevaluation.

## 2023-04-11 NOTE — Assessment & Plan Note (Signed)
 Chronic, poor control Mood moderately controlled on sertraline 100 mg at night.  Will add 15 mg of mirtazapine at bedtime to help with sleep.  Hopefully we will be able to wean off trazodone if sleep is improved with mirtazapine. Follow-up in 2 weeks for reevaluation

## 2023-04-11 NOTE — Progress Notes (Signed)
 VIRTUAL VISIT A virtual visit is felt to be most appropriate for this patient at this time.   I connected with the patient on 04/11/23 at  9:40 AM EDT by virtual telehealth platform and verified that I am speaking with the correct person using two identifiers.   I discussed the limitations, risks, security and privacy concerns of performing an evaluation and management service by  virtual telehealth platform and the availability of in person appointments. I also discussed with the patient that there may be a patient responsible charge related to this service. The patient expressed understanding and agreed to proceed.  Patient location: Home Provider Location: [facility] Participants: Kerby Nora and [name]   Chief Complaint  Patient presents with   Diarrhea   Insomnia    Trazodone not helping    History of Present Illness:  89 y.o. female patient of Neal Oshea E, MD presents with acute worsening of chronic diarrhea, continued chronic insomnia  Chronic insomnia: On 100 mg trazodone at bedtime without improvement.  Also treating anxiety/MDD with sertraline at 100 mg daily. Trouble falling asleep and staying asleep. Only 2 hours of sleep a night.    04/11/2023    9:52 AM 12/28/2022   12:08 PM 07/25/2022    3:07 PM  Depression screen PHQ 2/9  Decreased Interest 2 3 0  Down, Depressed, Hopeless 0 3 0  PHQ - 2 Score 2 6 0  Altered sleeping 3 3 0  Tired, decreased energy 2 3 0  Change in appetite 0 0 0  Feeling bad or failure about yourself  0 3 0  Trouble concentrating 0 2 0  Moving slowly or fidgety/restless 0 0 0  Suicidal thoughts 0 0 0  PHQ-9 Score 7 17 0  Difficult doing work/chores Not difficult at all Extremely dIfficult Not difficult at all      12/28/2022   12:08 PM 06/29/2022    9:32 AM 04/30/2019   12:18 PM 01/24/2019   12:13 PM  GAD 7 : Generalized Anxiety Score  Nervous, Anxious, on Edge 1 0 3 2  Control/stop worrying 1 0 1 2  Worry too much -  different things 1 0 3 2  Trouble relaxing 0 0 3 2  Restless 0 0 1 1  Easily annoyed or irritable 0 0 1 1  Afraid - awful might happen 0 0 0 0  Total GAD 7 Score 3 0 12 10  Anxiety Difficulty Very difficult Not difficult at all Somewhat difficult Somewhat difficult      She has a history of chronic diarrhea.  At last evaluation February 16, 2023 felt symptoms could be possible IBS.  She returned GI pathogen panel and C. difficile testing which was negative. Advised continuing align or Culturelle.  Given prescription for Questran to use as needed.   She reports she still has small stools with explosive diarrhea.  She has tried keeping food diary, stop cheese.. no clear cause.   She never heard from referral coordinator for GI.   COVID 19 screen No recent travel or known exposure to COVID19 The patient denies respiratory symptoms of COVID 19 at this time.  The importance of social distancing was discussed today.   Review of Systems  Constitutional:  Negative for chills and fever.  HENT:  Negative for congestion and ear pain.   Eyes:  Negative for pain and redness.  Respiratory:  Negative for cough and shortness of breath.   Cardiovascular:  Negative for chest  pain, palpitations and leg swelling.  Gastrointestinal:  Positive for diarrhea. Negative for abdominal pain, blood in stool, constipation, nausea and vomiting.  Genitourinary:  Negative for dysuria.  Musculoskeletal:  Negative for falls and myalgias.  Skin:  Negative for rash.  Neurological:  Negative for dizziness.  Psychiatric/Behavioral:  Negative for depression. The patient is not nervous/anxious.       Past Medical History:  Diagnosis Date   Arthritis    Basal cell carcinoma of tragus, left    Cataract    right   Complication of anesthesia    COULD'T MOVE WHEN WAKING UP AFTER SECONT COLON SURGERY   Depression    Hypertension    Intestinal obstruction (HCC)    Osteoporosis     reports that she quit smoking  about 30 years ago. Her smoking use included cigarettes. She has never used smokeless tobacco. She reports current alcohol use of about 7.0 standard drinks of alcohol per week. She reports that she does not use drugs.   Current Outpatient Medications:    cholestyramine light 4 g POWD, Take 1 packet (4 g total) by mouth 3 (three) times daily as needed., Disp: 30 packet, Rfl: 3   mirtazapine (REMERON) 15 MG tablet, Take 1 tablet (15 mg total) by mouth at bedtime., Disp: 30 tablet, Rfl: 0   acetaminophen (TYLENOL) 500 MG tablet, Take 1,000 mg by mouth 3 (three) times daily as needed. Give Two tablets by mouth twice daily., Disp: , Rfl:    amLODipine (NORVASC) 5 MG tablet, Take 1 tablet (5 mg total) by mouth daily., Disp: 30 tablet, Rfl: 0   bismuth subsalicylate (PEPTO BISMOL) 262 MG/15ML suspension, Take 30 mLs by mouth as needed., Disp: , Rfl:    celecoxib (CELEBREX) 200 MG capsule, Take 1 capsule (200 mg total) by mouth 2 (two) times daily., Disp: 60 capsule, Rfl: 0   clonazePAM (KLONOPIN) 0.5 MG tablet, Take 0.5 tablets (0.25 mg total) by mouth 2 (two) times daily as needed for anxiety., Disp: 30 tablet, Rfl: 0   donepezil (ARICEPT) 10 MG tablet, Take 1 tablet (10 mg total) by mouth at bedtime., Disp: 90 tablet, Rfl: 3   gabapentin (NEURONTIN) 100 MG capsule, Take 1 capsule (100 mg total) by mouth 3 (three) times daily as needed., Disp: 90 capsule, Rfl: 1   sertraline (ZOLOFT) 100 MG tablet, TAKE 1 TABLET(100 MG) BY MOUTH AT BEDTIME, Disp: 90 tablet, Rfl: 0   traZODone (DESYREL) 100 MG tablet, TAKE 1 TABLET(50 MG) BY MOUTH AT BEDTIME AS NEEDED FOR SLEEP, Disp: 30 tablet, Rfl: 3   Vitamin D, Ergocalciferol, (DRISDOL) 1.25 MG (50000 UNIT) CAPS capsule, Take 1 capsule (50,000 Units total) by mouth every 7 (seven) days., Disp: 12 capsule, Rfl: 0   Observations/Objective: Height 5\' 4"  (1.626 m).  Physical Exam Constitutional:      General: The patient is not in acute distress. Pulmonary:      Effort: Pulmonary effort is normal. No respiratory distress.  Neurological:     Mental Status: The patient is alert and oriented to person, place, and time.  Psychiatric:        Mood and Affect: Mood normal.        Behavior: Behavior normal.    Assessment and Plan Chronic insomnia Assessment & Plan: Chronic, poor control Mood moderately controlled on sertraline 100 mg at night.  Will add 15 mg of mirtazapine at bedtime to help with sleep.  Hopefully we will be able to wean off trazodone if sleep is  improved with mirtazapine. Follow-up in 2 weeks for reevaluation   Chronic diarrhea Assessment & Plan: Chronic, GI pathogen evaluation was negative in January.  She continues to have chronic diarrhea possibly irritable bowel syndrome. Will prescribe Questran to use as needed up to 3 times a day. Encouraged patient to call [facility] where her GI referral was sent in January.  She should be able to make an appointment to move forward.   Other orders -     Mirtazapine; Take 1 tablet (15 mg total) by mouth at bedtime.  Dispense: 30 tablet; Refill: 0 -     Cholestyramine Light; Take 1 packet (4 g total) by mouth 3 (three) times daily as needed.  Dispense: 30 packet; Refill: 3      I discussed the assessment and treatment plan with the patient. The patient was provided an opportunity to ask questions and all were answered. The patient agreed with the plan and demonstrated an understanding of the instructions.   The patient was advised to call back or seek an in-person evaluation if the symptoms worsen or if the condition fails to improve as anticipated.     Kerby Nora, MD

## 2023-04-11 NOTE — Assessment & Plan Note (Signed)
 Chronic, GI pathogen evaluation was negative in January.  She continues to have chronic diarrhea possibly irritable bowel syndrome. Will prescribe Questran to use as needed up to 3 times a day. Encouraged patient to call  GI where her GI referral was sent in January.  She should be able to make an appointment to move forward.

## 2023-04-13 ENCOUNTER — Telehealth: Payer: Self-pay

## 2023-04-13 NOTE — Telephone Encounter (Signed)
 The patient left a voicemail requesting to schedule an appointment at our office. She mentioned that her provider has sent over the referral. I informed her that we currently do not have any available appointments for new patients. I offered to add her to our call-back list and assured her that we will contact her as soon as an appointment becomes available.

## 2023-05-02 ENCOUNTER — Encounter: Payer: Self-pay | Admitting: Family Medicine

## 2023-05-02 ENCOUNTER — Telehealth (INDEPENDENT_AMBULATORY_CARE_PROVIDER_SITE_OTHER): Admitting: Family Medicine

## 2023-05-02 VITALS — Ht 64.0 in | Wt 110.0 lb

## 2023-05-02 DIAGNOSIS — F5104 Psychophysiologic insomnia: Secondary | ICD-10-CM | POA: Diagnosis not present

## 2023-05-02 DIAGNOSIS — K529 Noninfective gastroenteritis and colitis, unspecified: Secondary | ICD-10-CM | POA: Diagnosis not present

## 2023-05-02 NOTE — Assessment & Plan Note (Addendum)
 Chronic, unclear etiology, possible irritable bowel syndrome.  Negative GI pathogen workup.  She has upcoming appointment with GI and is on the waiting list for April.  She has noted some decrease in frequent stools with initiation of Questran 3 times daily.  I have told her she can increase this to 4 times daily if needed and could also use prior to needing to go somewhere if she is worried about having explosive loose stools. Continue fiber, water and probiotic

## 2023-05-02 NOTE — Progress Notes (Signed)
 VIRTUAL VISIT A virtual visit is felt to be most appropriate for this patient at this time.   I connected with the patient on 05/02/23 at 11:00 AM EDT by virtual telehealth platform and verified that I am speaking with the correct person using two identifiers.   I discussed the limitations, risks, security and privacy concerns of performing an evaluation and management service by  virtual telehealth platform and the availability of in person appointments. I also discussed with the patient that there may be a patient responsible charge related to this service. The patient expressed understanding and agreed to proceed.  Patient location: Home Provider Location: [facility] Participants: Kerby Nora and [name]   Chief Complaint  Patient presents with   Diarrhea   Insomnia    History of Present Illness:  89 y.o. female patient of Ermalene Searing, Jashon Ishida E, MD presents with acute worsening of chronic diarrhea, continued chronic insomnia Seen at last office visit April 11, 2023 virtual visit for chronic insomnia and chronic diarrhea.  Chronic insomnia: At last office added 15 mg of mirtazapine at bedtime to help with sleep.  On 100 mg trazodone at bedtime   Also treating anxiety/MDD with sertraline at 100 mg daily. Trouble falling asleep and staying asleep.    She feels she has slept better overall.. now sleeping 6 hours in past few nights. Does not feel oversedated    04/11/2023    9:52 AM 12/28/2022   12:08 PM 07/25/2022    3:07 PM  Depression screen PHQ 2/9  Decreased Interest 2 3 0  Down, Depressed, Hopeless 0 3 0  PHQ - 2 Score 2 6 0  Altered sleeping 3 3 0  Tired, decreased energy 2 3 0  Change in appetite 0 0 0  Feeling bad or failure about yourself  0 3 0  Trouble concentrating 0 2 0  Moving slowly or fidgety/restless 0 0 0  Suicidal thoughts 0 0 0  PHQ-9 Score 7 17 0  Difficult doing work/chores Not difficult at all Extremely dIfficult Not difficult at all       12/28/2022   12:08 PM 06/29/2022    9:32 AM 04/30/2019   12:18 PM 01/24/2019   12:13 PM  GAD 7 : Generalized Anxiety Score  Nervous, Anxious, on Edge 1 0 3 2  Control/stop worrying 1 0 1 2  Worry too much - different things 1 0 3 2  Trouble relaxing 0 0 3 2  Restless 0 0 1 1  Easily annoyed or irritable 0 0 1 1  Afraid - awful might happen 0 0 0 0  Total GAD 7 Score 3 0 12 10  Anxiety Difficulty Very difficult Not difficult at all Somewhat difficult Somewhat difficult    She has a history of chronic diarrhea.  At last evaluation February 16, 2023 felt symptoms could be possible IBS.  She returned GI pathogen panel and C. difficile testing which was negative. Advised continuing align or Culturelle.  Given prescription for Questran to use as needed at last office visit. Today she reports she has had some improvement in diarrhea with this medication.  She is on waiting list for appt in April.   COVID 19 screen No recent travel or known exposure to COVID19 The patient denies respiratory symptoms of COVID 19 at this time.  The importance of social distancing was discussed today.   Review of Systems  Constitutional:  Negative for chills and fever.  HENT:  Negative for congestion  and ear pain.   Eyes:  Negative for pain and redness.  Respiratory:  Negative for cough and shortness of breath.   Cardiovascular:  Negative for chest pain, palpitations and leg swelling.  Gastrointestinal:  Positive for diarrhea. Negative for abdominal pain, blood in stool, constipation, nausea and vomiting.  Genitourinary:  Negative for dysuria.  Musculoskeletal:  Negative for falls and myalgias.  Skin:  Negative for rash.  Neurological:  Negative for dizziness.  Psychiatric/Behavioral:  Negative for depression. The patient is not nervous/anxious.       Past Medical History:  Diagnosis Date   Arthritis    Basal cell carcinoma of tragus, left    Cataract    right   Complication of anesthesia    COULD'T  MOVE WHEN WAKING UP AFTER SECONT COLON SURGERY   Depression    Hypertension    Intestinal obstruction (HCC)    Osteoporosis     reports that she quit smoking about 30 years ago. Her smoking use included cigarettes. She has never used smokeless tobacco. She reports current alcohol use of about 7.0 standard drinks of alcohol per week. She reports that she does not use drugs.   Current Outpatient Medications:    acetaminophen (TYLENOL) 500 MG tablet, Take 1,000 mg by mouth 3 (three) times daily as needed. Give Two tablets by mouth twice daily., Disp: , Rfl:    amLODipine (NORVASC) 5 MG tablet, Take 1 tablet (5 mg total) by mouth daily., Disp: 30 tablet, Rfl: 0   bismuth subsalicylate (PEPTO BISMOL) 262 MG/15ML suspension, Take 30 mLs by mouth as needed., Disp: , Rfl:    celecoxib (CELEBREX) 200 MG capsule, Take 1 capsule (200 mg total) by mouth 2 (two) times daily., Disp: 60 capsule, Rfl: 0   cholestyramine light 4 g POWD, Take 1 packet (4 g total) by mouth 3 (three) times daily as needed., Disp: 30 packet, Rfl: 3   clonazePAM (KLONOPIN) 0.5 MG tablet, Take 0.5 tablets (0.25 mg total) by mouth 2 (two) times daily as needed for anxiety., Disp: 30 tablet, Rfl: 0   donepezil (ARICEPT) 10 MG tablet, Take 1 tablet (10 mg total) by mouth at bedtime., Disp: 90 tablet, Rfl: 3   gabapentin (NEURONTIN) 100 MG capsule, Take 1 capsule (100 mg total) by mouth 3 (three) times daily as needed., Disp: 90 capsule, Rfl: 1   mirtazapine (REMERON) 15 MG tablet, Take 1 tablet (15 mg total) by mouth at bedtime., Disp: 30 tablet, Rfl: 0   sertraline (ZOLOFT) 100 MG tablet, TAKE 1 TABLET(100 MG) BY MOUTH AT BEDTIME, Disp: 90 tablet, Rfl: 0   traZODone (DESYREL) 100 MG tablet, TAKE 1 TABLET(50 MG) BY MOUTH AT BEDTIME AS NEEDED FOR SLEEP, Disp: 30 tablet, Rfl: 3   Vitamin D, Ergocalciferol, (DRISDOL) 1.25 MG (50000 UNIT) CAPS capsule, Take 1 capsule (50,000 Units total) by mouth every 7 (seven) days., Disp: 12 capsule, Rfl:  0   Observations/Objective: Height 5\' 4"  (1.626 m).  Physical Exam Constitutional:      General: The patient is not in acute distress. Pulmonary:     Effort: Pulmonary effort is normal. No respiratory distress.  Neurological:     Mental Status: The patient is alert and oriented to person, place, and time.  Psychiatric:        Mood and Affect: Mood normal.        Behavior: Behavior normal.    Assessment and Plan Chronic insomnia Assessment & Plan: Chronic, on trazodone 100 mg p.o. at bedtime, she  has noted some increase in length of time she is sleeping with addition of mirtazapine 15 mg p.o. nightly.  At this point since she is sleeping 6 hours a night we will hold off on increasing this medication further. She has also noticed some increased in appetite and I encouraged her to work on eating healthy foods and to go ahead and gain weight given her body mass index is at 18   Chronic diarrhea Assessment & Plan: Chronic, unclear etiology, possible irritable bowel syndrome.  Negative GI pathogen workup.  She has upcoming appointment with GI and is on the waiting list for April.  She has noted some decrease in frequent stools with initiation of Questran 3 times daily.  I have told her she can increase this to 4 times daily if needed and could also use prior to needing to go somewhere if she is worried about having explosive loose stools. Continue fiber, water and probiotic        I discussed the assessment and treatment plan with the patient. The patient was provided an opportunity to ask questions and all were answered. The patient agreed with the plan and demonstrated an understanding of the instructions.   The patient was advised to call back or seek an in-person evaluation if the symptoms worsen or if the condition fails to improve as anticipated.     Kerby Nora, MD

## 2023-05-02 NOTE — Assessment & Plan Note (Signed)
 Chronic, on trazodone 100 mg p.o. at bedtime, she has noted some increase in length of time she is sleeping with addition of mirtazapine 15 mg p.o. nightly.  At this point since she is sleeping 6 hours a night we will hold off on increasing this medication further. She has also noticed some increased in appetite and I encouraged her to work on eating healthy foods and to go ahead and gain weight given her body mass index is at 18

## 2023-05-09 DIAGNOSIS — Z961 Presence of intraocular lens: Secondary | ICD-10-CM | POA: Diagnosis not present

## 2023-05-09 DIAGNOSIS — H2512 Age-related nuclear cataract, left eye: Secondary | ICD-10-CM | POA: Diagnosis not present

## 2023-05-09 DIAGNOSIS — Z0100 Encounter for examination of eyes and vision without abnormal findings: Secondary | ICD-10-CM | POA: Diagnosis not present

## 2023-05-09 DIAGNOSIS — H11003 Unspecified pterygium of eye, bilateral: Secondary | ICD-10-CM | POA: Diagnosis not present

## 2023-05-25 ENCOUNTER — Telehealth: Payer: Self-pay

## 2023-05-25 NOTE — Telephone Encounter (Signed)
 Left message for patient to return call to office - we can schedule an appointment with Tina G.

## 2023-07-10 NOTE — Progress Notes (Signed)
 Ellouise Console, PA-C [address] [place], [place]  [address] Phone: [phone]   Gastroenterology Consultation  Referring Provider:     Avelina Greig BRAVO, MD Primary Care Physician:  Avelina Greig BRAVO, MD Primary Gastroenterologist:  Ellouise Console, PA-C / Elspeth Naval, MD  Reason for Consultation:     Chronic diarrhea        HPI:   Catherine Young is a 89 y.o. y/o female is referred for consultation & management by Avelina Greig BRAVO, MD.  New patient.  Lives at [facility]  Referred to evaluate chronic diarrhea ongoing for over a year.  She is here today with her daughter Catherine Young.  01/2023 stool studies: Negative GI pathogen panel.  Negative C. difficile toxin A and B.    01/2023 labs: CBC, CMP, TSH: Normal WBC 6.2, Hgb 11.5, MCV 98.  Normal TSH.  Glucose 128, chloride 94, otherwise normal electrolytes.  Normal LFTs.  BUN 21, creatinine 1.02, GFR 48.8.  Patient tried Pepto-Bismol with little benefit.  She discontinued Pepto-Bismol which was causing black stools.  She was started on cholestyramine  powder 1 packet in a drink 3 times daily before meals by her PCP, Dr. Avelina.  On cholestyramine , her diarrhea has greatly improved.  She is currently having 2 formed bowel movements daily.  If she does not take cholestyramine  before meal, then she has diarrhea.  She denies rectal bleeding, constipation, or abdominal pain.  She admits to weight loss.  No nausea, or vomiting.  Has good appetite and eats 3 meals per day.  She fell and broke her pelvis 10 months ago.  Currently on Celebrex  which helps a great deal with pain.  Review of her weights shows stable and improved in the past year: 07/2022: 110 pounds 08/2022: 109 pounds 11/2022: 110 pounds. 04/2023: 110 pounds 07/11/2023: 113 pounds.  06/2010 colonoscopy at [facility] in [place] (Dr. Darra): Prep was fair, diverticulosis, torturous/redundant colon, medium lipoma in the mid transverse colon.  No  polyps.  2004 colonoscopy in [place]: Technically difficult due to colon anatomy and extensive diverticulosis.  Past Medical History:  Diagnosis Date   Anxiety    Arthritis    Basal cell carcinoma of tragus, left    Cataract    right   Complication of anesthesia    COULD'T MOVE WHEN WAKING UP AFTER SECONT COLON SURGERY   Depression    Hypertension    Intestinal obstruction (HCC)    Osteoporosis     Past Surgical History:  Procedure Laterality Date   ABDOMINAL HYSTERECTOMY     partial, for menorrhagia   APPENDECTOMY     CATARACT EXTRACTION W/PHACO Right 05/25/2017   Procedure: CATARACT EXTRACTION PHACO AND INTRAOCULAR LENS PLACEMENT (IOC);  Surgeon: Myrna Adine Anes, MD;  Location: [facility] ORS;  Service: Ophthalmology;  Laterality: Right;  fluid pack lot #  [id number]  exp  12/24/2018 US    00:39.4 AP%   9.4 CDE    3.71    COLON SURGERY     INTESTINAL BLOCKAGE FROM SCAR TISSUE X 2   IR KYPHO LUMBAR INC FX REDUCE BONE BX UNI/BIL CANNULATION INC/IMAGING  11/30/2020    Prior to Admission medications   Medication Sig Start Date End Date Taking? Authorizing Provider  acetaminophen  (TYLENOL ) 500 MG tablet Take 1,000 mg by mouth 3 (three) times daily as needed. Give Two tablets by mouth twice daily.    [provider]  amLODipine  (NORVASC ) 5 MG tablet Take 1  tablet (5 mg total) by mouth daily. 08/23/22   Patel, Sona, MD  celecoxib  (CELEBREX ) 200 MG capsule Take 1 capsule (200 mg total) by mouth 2 (two) times daily. 12/28/22   Bedsole, Amy E, MD  cholestyramine  light 4 g POWD Take 1 packet (4 g total) by mouth 3 (three) times daily as needed. 04/11/23   Bedsole, Amy E, MD  clonazePAM  (KLONOPIN ) 0.5 MG tablet Take 0.5 tablets (0.25 mg total) by mouth 2 (two) times daily as needed for anxiety. 02/09/23   Bedsole, Amy E, MD  donepezil  (ARICEPT ) 10 MG tablet Take 1 tablet (10 mg total) by mouth at bedtime. 07/26/22   Bedsole, Amy E, MD  gabapentin  (NEURONTIN ) 100 MG capsule Take 1  capsule (100 mg total) by mouth 3 (three) times daily as needed. 12/28/22   Bedsole, Amy E, MD  mirtazapine  (REMERON ) 15 MG tablet Take 1 tablet (15 mg total) by mouth at bedtime. 04/11/23   Bedsole, Amy E, MD  sertraline  (ZOLOFT ) 100 MG tablet TAKE 1 TABLET(100 MG) BY MOUTH AT BEDTIME 09/23/22   Dineen Rollene MATSU, FNP  traZODone  (DESYREL ) 100 MG tablet TAKE 1 TABLET(50 MG) BY MOUTH AT BEDTIME AS NEEDED FOR SLEEP 04/19/22   Bedsole, Amy E, MD  Vitamin D , Ergocalciferol , (DRISDOL ) 1.25 MG (50000 UNIT) CAPS capsule Take 1 capsule (50,000 Units total) by mouth every 7 (seven) days. 07/26/22   Avelina Greig BRAVO, MD    Family History  Problem Relation Age of Onset   Arthritis Mother    Cancer - Colon Mother    Diabetes Father    Arthritis Sister    Colon cancer Sister    Arthritis Sister    Colon cancer Sister    Lung cancer Brother      Social History   Tobacco Use   Smoking status: Former    Current packs/day: 0.00    Types: Cigarettes    Quit date: 01/24/1993    Years since quitting: 30.4   Smokeless tobacco: Never   Tobacco comments:    quit 1995  Vaping Use   Vaping status: Never Used  Substance Use Topics   Alcohol use: Yes    Alcohol/week: 7.0 standard drinks of alcohol    Types: 7 Glasses of wine per week    Comment: nightly glass of wine   Drug use: No    Allergies as of 07/11/2023 - Review Complete 07/11/2023  Allergen Reaction Noted   Ampicillin  07/11/2023   Desvenlafaxine  07/11/2023   Escitalopram  07/11/2023   Fosamax  [alendronate ] Diarrhea 11/12/2020   Prednisone   09/10/2021    Review of Systems:    All systems reviewed and negative except where noted in HPI.   Physical Exam:  BP (!) 164/60 (BP Location: Left Arm, Patient Position: Sitting, Cuff Size: Normal)   Pulse 60   Ht 5' 3 (1.6 m) Comment: height measured without shoes  Wt 113 lb (51.3 kg)   BMI 20.02 kg/m  No LMP recorded. Patient is postmenopausal.  General:   Alert, thin feeble elderly female,,  pleasant and cooperative in NAD Lungs:  Respirations even and unlabored.  Clear throughout to auscultation.   No wheezes, crackles, or rhonchi. No acute distress. Heart:  Regular rate and rhythm; no murmurs, clicks, rubs, or gallops. Abdomen: Thin.  Normal bowel sounds.  No bruits.  Soft, and non-distended without masses, hepatosplenomegaly or hernias noted.  No Tenderness.  No guarding or rebound tenderness.    Neurologic:  Alert and oriented x3;  grossly normal neurologically. Psych:  Alert and cooperative. Normal mood and affect.  Imaging Studies: No results found.  Labs: CBC    Component Value Date/Time   WBC 6.2 02/09/2023 1551   RBC 3.54 (L) 02/09/2023 1551   HGB 11.5 (L) 02/09/2023 1551   HCT 34.9 (L) 02/09/2023 1551   PLT 360.0 02/09/2023 1551   MCV 98.6 02/09/2023 1551   MCH 32.1 08/17/2022 0401   MCHC 32.8 02/09/2023 1551   RDW 14.1 02/09/2023 1551   LYMPHSABS 1.8 02/09/2023 1551   MONOABS 0.5 02/09/2023 1551   EOSABS 0.2 02/09/2023 1551   BASOSABS 0.0 02/09/2023 1551    CMP     Component Value Date/Time   NA 139 02/13/2023 1417   NA 140 09/29/2022 0000   K 4.2 02/13/2023 1417   CL 100 02/13/2023 1417   CO2 32 02/13/2023 1417   GLUCOSE 116 (H) 02/13/2023 1417   BUN 17 02/13/2023 1417   BUN 12 09/29/2022 0000   CREATININE 0.96 02/13/2023 1417   CALCIUM 9.3 02/13/2023 1417   PROT 7.2 02/09/2023 1551   ALBUMIN 3.8 02/09/2023 1551   AST 17 02/09/2023 1551   ALT 9 02/09/2023 1551   ALKPHOS 58 02/09/2023 1551   BILITOT 0.4 02/09/2023 1551   GFRNONAA >60 08/17/2022 0401   GFRAA >60 10/21/2019 1523    Assessment and Plan:   JAXYN MESTAS is a 89 y.o. y/o female has been referred for chronic diarrhea.  Differential includes but is not limited to IBS-D, Celiac, microscopic colitis, adverse side effect of medication (Aricept ), EPI.  Her diarrhea is currently well-controlled on cholestyramine  1 packet in a drink 3 times daily before meals.  She is having 2  formed bowel movements daily on this treatment.  Previous stool studies were negative for all infections.  She has no alarm symptoms.  Not a candidate for repeat colonoscopy due to advanced age.  Aricept  could potentially contribute to diarrhea, however patient does not want to discontinue.  Celebrex  and NSAIDs can potentially trigger microscopic colitis, however Celebrex  has helped greatly with her pain from pelvic fracture.  She seems stable and improved at this time.  1.  Chronic diarrhea -controlled on cholestyramine  1 packet 3 times daily - Labs: CBC, BMP, celiac panel - Continue on cholestyramine  powder 1 packet in a drink 3 times daily before meals. - Drink 64 ounces of fluids daily.  2.  Chronic Anemia - Labs: CBC, iron panel, ferritin, B12, folate, celiac panel. - Not currently on iron or B12.  If levels are low, then start supplement.  Follow up in 3 months or sooner if she has recurrent worsening diarrhea.  Ellouise Console, PA-C

## 2023-07-11 ENCOUNTER — Other Ambulatory Visit (INDEPENDENT_AMBULATORY_CARE_PROVIDER_SITE_OTHER)

## 2023-07-11 ENCOUNTER — Encounter: Payer: Self-pay | Admitting: Physician Assistant

## 2023-07-11 ENCOUNTER — Ambulatory Visit: Admitting: Physician Assistant

## 2023-07-11 VITALS — BP 164/60 | HR 60 | Ht 63.0 in | Wt 113.0 lb

## 2023-07-11 DIAGNOSIS — D649 Anemia, unspecified: Secondary | ICD-10-CM

## 2023-07-11 DIAGNOSIS — K529 Noninfective gastroenteritis and colitis, unspecified: Secondary | ICD-10-CM

## 2023-07-11 LAB — CBC WITH DIFFERENTIAL/PLATELET
Basophils Absolute: 0 10*3/uL (ref 0.0–0.1)
Basophils Relative: 0.4 % (ref 0.0–3.0)
Eosinophils Absolute: 0.3 10*3/uL (ref 0.0–0.7)
Eosinophils Relative: 4.1 % (ref 0.0–5.0)
HCT: 33.7 % — ABNORMAL LOW (ref 36.0–46.0)
Hemoglobin: 11 g/dL — ABNORMAL LOW (ref 12.0–15.0)
Lymphocytes Relative: 25.7 % (ref 12.0–46.0)
Lymphs Abs: 1.6 10*3/uL (ref 0.7–4.0)
MCHC: 32.5 g/dL (ref 30.0–36.0)
MCV: 98 fl (ref 78.0–100.0)
Monocytes Absolute: 0.6 10*3/uL (ref 0.1–1.0)
Monocytes Relative: 9.4 % (ref 3.0–12.0)
Neutro Abs: 3.8 10*3/uL (ref 1.4–7.7)
Neutrophils Relative %: 60.4 % (ref 43.0–77.0)
Platelets: 228 10*3/uL (ref 150.0–400.0)
RBC: 3.44 Mil/uL — ABNORMAL LOW (ref 3.87–5.11)
RDW: 13.8 % (ref 11.5–15.5)
WBC: 6.4 10*3/uL (ref 4.0–10.5)

## 2023-07-11 LAB — BASIC METABOLIC PANEL WITH GFR
BUN: 30 mg/dL — ABNORMAL HIGH (ref 6–23)
CO2: 30 meq/L (ref 19–32)
Calcium: 9.7 mg/dL (ref 8.4–10.5)
Chloride: 103 meq/L (ref 96–112)
Creatinine, Ser: 1.28 mg/dL — ABNORMAL HIGH (ref 0.40–1.20)
GFR: 37.08 mL/min — ABNORMAL LOW (ref >60.00)
Glucose, Bld: 89 mg/dL (ref 70–99)
Potassium: 5.4 meq/L — ABNORMAL HIGH (ref 3.5–5.1)
Sodium: 139 meq/L (ref 135–145)

## 2023-07-11 LAB — B12 AND FOLATE PANEL
Folate: 12.4 ng/mL (ref >5.9)
Vitamin B-12: 232 pg/mL (ref 211–911)

## 2023-07-11 NOTE — Patient Instructions (Signed)
 Your provider has requested that you go to the basement level for lab work before leaving today. Press B on the elevator. The lab is located at the first door on the left as you exit the elevator.  Please follow up sooner if symptoms increase or worsen  Due to recent changes in healthcare laws, you may see the results of your imaging and laboratory studies on MyChart before your provider has had a chance to review them.  We understand that in some cases there may be results that are confusing or concerning to you. Not all laboratory results come back in the same time frame and the provider may be waiting for multiple results in order to interpret others.  Please give us  48 hours in order for your provider to thoroughly review all the results before contacting the office for clarification of your results.   Thank you for trusting me with your gastrointestinal care!   Brigitte Canard, PA-C _______________________________________________________  If your blood pressure at your visit was 140/90 or greater, please contact your primary care physician to follow up on this.  _______________________________________________________  If you are age 88 or older, your body mass index should be between 23-30. Your Body mass index is 20.02 kg/m. If this is out of the aforementioned range listed, please consider follow up with your Primary Care Provider.  If you are age 83 or younger, your body mass index should be between 19-25. Your Body mass index is 20.02 kg/m. If this is out of the aformentioned range listed, please consider follow up with your Primary Care Provider.   ________________________________________________________  The [facility] providers would like to encourage you to use MYCHART to communicate with providers for non-urgent requests or questions.  Due to long hold times on the telephone, sending your provider a message by [facility] may be a faster and more efficient way to get a response.   Please allow 48 business hours for a response.  Please remember that this is for non-urgent requests.  _______________________________________________________

## 2023-07-11 NOTE — Progress Notes (Signed)
 Agree with assessment and plan as outlined.

## 2023-07-12 LAB — IRON,TIBC AND FERRITIN PANEL
%SAT: 45 % (ref 16–45)
Ferritin: 23 ng/mL (ref 16–288)
Iron: 144 ug/dL (ref 45–160)
TIBC: 322 ug/dL (ref 250–450)

## 2023-07-14 ENCOUNTER — Ambulatory Visit: Payer: Self-pay | Admitting: Physician Assistant

## 2023-07-14 NOTE — Progress Notes (Signed)
 Call and notify patient and her daughter: 1.  Potassium is a little elevated.  Please stop all potassium supplements.  Do not eat any bananas, tomatoes, potatoes, or orange juice right now, which are high in potassium. 2.  Kidney function has worsened compared to labs done 5 months ago.  She appears to be dehydrated.  I recommend patient drink 64 ounces of water daily.  Repeat BMP lab in 1 week. 3.  Stable chronic anemia.  Hemoglobin 11.0. 4.  Normal white count.  No evidence of infection. 5.  Iron studies are normal.  No evidence of iron deficiency.  Folate is normal. 6.  Vitamin B12 is a little low.  Please start OTC vitamin B12 supplement, 1000 mcg once daily. 7.  Still waiting on celiac lab result. Brigitte Canard, PA-C

## 2023-07-16 LAB — CELIAC DISEASE AB SCREEN W/RFX
Antigliadin Abs, IgA: 1 U (ref 0–19)
IgA/Immunoglobulin A, Serum: 80 mg/dL (ref 64–422)

## 2023-07-16 NOTE — Progress Notes (Signed)
 Call and notify patient and her daughter:  Celiac Labs are Negative.  No evidence of Gluten allergy. Also see other lab results previously addressed. Ellouise Console, PA-C

## 2023-07-19 ENCOUNTER — Other Ambulatory Visit: Payer: Self-pay

## 2023-07-19 DIAGNOSIS — R7989 Other specified abnormal findings of blood chemistry: Secondary | ICD-10-CM

## 2023-07-19 NOTE — Telephone Encounter (Signed)
 Spoke with pt and she is aware of results and recommendations per Ellouise Console PA. She knows to come for labs in 1 week, order in epic.

## 2023-07-20 ENCOUNTER — Telehealth: Payer: Self-pay | Admitting: Family Medicine

## 2023-07-20 NOTE — Telephone Encounter (Signed)
 Copied from CRM [id number]. Topic: General - Other >> Jul 20, 2023 11:57 AM Thersia BROCKS wrote: Reason for CRM: [facility] called in regarding patients stated they Need the DNR paperwork mailed and not faxed  [id number]  [place] [phone]

## 2023-07-20 NOTE — Telephone Encounter (Addendum)
 There is no DNR on file for Catherine Young to [facility] which was noted on the faxed I sent back yesterday.   Sent another fax today letting them know no DNR in patient's chart.

## 2023-07-31 ENCOUNTER — Ambulatory Visit (INDEPENDENT_AMBULATORY_CARE_PROVIDER_SITE_OTHER): Payer: Medicare PPO

## 2023-07-31 ENCOUNTER — Telehealth: Payer: Self-pay | Admitting: *Deleted

## 2023-07-31 VITALS — BP 164/60 | Ht 63.0 in | Wt 113.0 lb

## 2023-07-31 DIAGNOSIS — Z2821 Immunization not carried out because of patient refusal: Secondary | ICD-10-CM | POA: Diagnosis not present

## 2023-07-31 DIAGNOSIS — Z0000 Encounter for general adult medical examination without abnormal findings: Secondary | ICD-10-CM

## 2023-07-31 NOTE — Progress Notes (Signed)
 Because this visit was a virtual/telehealth visit,  certain criteria was not obtained, such a blood pressure, CBG if applicable, and timed get up and go. Any medications not marked as taking were not mentioned during the medication reconciliation part of the visit. Any vitals not documented were not able to be obtained due to this being a telehealth visit or patient was unable to self-report a recent blood pressure reading due to a lack of equipment at home via telehealth. Vitals that have been documented are verbally provided by the patient.   This visit was performed by a medical professional under my direct supervision. I was immediately available for consultation/collaboration. I have reviewed and agree with the Annual Wellness Visit documentation.  Subjective:   Catherine Young is a 89 y.o. who presents for a Medicare Wellness preventive visit.  As a reminder, Annual Wellness Visits don't include a physical exam, and some assessments may be limited, especially if this visit is performed virtually. We may recommend an in-person follow-up visit with your provider if needed.  Visit Complete: Virtual I connected with  Catherine Young on 07/31/23 by a audio enabled telemedicine application and verified that I am speaking with the correct person using two identifiers.  Patient Location: Home  Provider Location: Home Office  I discussed the limitations of evaluation and management by telemedicine. The patient expressed understanding and agreed to proceed.  Vital Signs: Because this visit was a virtual/telehealth visit, some criteria may be missing or patient reported. Any vitals not documented were not able to be obtained and vitals that have been documented are patient reported.  VideoDeclined- This patient declined Librarian, academic. Therefore the visit was completed with audio only.  Persons Participating in Visit: Patient.  AWV Questionnaire: No: Patient  Medicare AWV questionnaire was not completed prior to this visit.  Cardiac Risk Factors include: advanced age (>49men, >47 women);sedentary lifestyle;hypertension     Objective:    Today's Vitals   07/31/23 1131  BP: (!) 164/60  Weight: 113 lb (51.3 kg)  Height: 5' 3 (1.6 m)   Body mass index is 20.02 kg/m.     07/31/2023   11:31 AM 12/06/2022    1:17 PM 10/13/2022   11:23 AM 09/20/2022   10:51 AM 09/20/2022   10:50 AM 08/26/2022    1:28 PM 08/24/2022    9:45 AM  Advanced Directives  Does Patient Have a Medical Advance Directive? Yes Yes Yes Yes Yes Yes Yes  Type of Estate agent of [place];Living will Out of facility DNR (pink MOST or yellow form) Out of facility DNR (pink MOST or yellow form) Out of facility DNR (pink MOST or yellow form)  Out of facility DNR (pink MOST or yellow form) Out of facility DNR (pink MOST or yellow form)  Does patient want to make changes to medical advance directive? No - Patient declined No - Patient declined No - Patient declined  No - Patient declined No - Patient declined No - Patient declined  Copy of Healthcare Power of Attorney in Chart? No - copy requested          Current Medications (verified) Outpatient Encounter Medications as of 07/31/2023  Medication Sig   acetaminophen  (TYLENOL ) 500 MG tablet Take 1,000 mg by mouth 3 (three) times daily as needed. Give Two tablets by mouth twice daily.   amLODipine  (NORVASC ) 5 MG tablet Take 1 tablet (5 mg total) by mouth daily.   bismuth subsalicylate (PEPTO BISMOL) 262 MG/15ML  suspension Take 30 mLs by mouth as needed.   celecoxib  (CELEBREX ) 200 MG capsule Take 1 capsule (200 mg total) by mouth 2 (two) times daily.   cholestyramine  light 4 g POWD Take 1 packet (4 g total) by mouth 3 (three) times daily as needed.   clonazePAM  (KLONOPIN ) 0.5 MG tablet Take 0.5 tablets (0.25 mg total) by mouth 2 (two) times daily as needed for anxiety.   donepezil  (ARICEPT ) 10 MG tablet Take 1 tablet  (10 mg total) by mouth at bedtime.   gabapentin  (NEURONTIN ) 100 MG capsule Take 1 capsule (100 mg total) by mouth 3 (three) times daily as needed.   mirtazapine  (REMERON ) 15 MG tablet Take 1 tablet (15 mg total) by mouth at bedtime.   sertraline  (ZOLOFT ) 100 MG tablet TAKE 1 TABLET(100 MG) BY MOUTH AT BEDTIME   traZODone  (DESYREL ) 100 MG tablet TAKE 1 TABLET(50 MG) BY MOUTH AT BEDTIME AS NEEDED FOR SLEEP   Vitamin D , Ergocalciferol , (DRISDOL ) 1.25 MG (50000 UNIT) CAPS capsule Take 1 capsule (50,000 Units total) by mouth every 7 (seven) days.   Wheat Dextrin (BENEFIBER PO) Take 1 capsule by mouth daily.   No facility-administered encounter medications on file as of 07/31/2023.    Allergies (verified) Ampicillin, Desvenlafaxine, Escitalopram, Fosamax  [alendronate ], and Prednisone    History: Past Medical History:  Diagnosis Date   Anxiety    Arthritis    Basal cell carcinoma of tragus, left    Cataract    right   Complication of anesthesia    COULD'T MOVE WHEN WAKING UP AFTER SECONT COLON SURGERY   Depression    Hypertension    Intestinal obstruction (HCC)    Osteoporosis    Past Surgical History:  Procedure Laterality Date   ABDOMINAL HYSTERECTOMY     partial, for menorrhagia   APPENDECTOMY     CATARACT EXTRACTION W/PHACO Right 05/25/2017   Procedure: CATARACT EXTRACTION PHACO AND INTRAOCULAR LENS PLACEMENT (IOC);  Surgeon: Myrna Adine Anes, MD;  Location: [facility] ORS;  Service: Ophthalmology;  Laterality: Right;  fluid pack lot #  [id number]  exp  12/24/2018 US    00:39.4 AP%   9.4 CDE    3.71    COLON SURGERY     INTESTINAL BLOCKAGE FROM SCAR TISSUE X 2   IR KYPHO LUMBAR INC FX REDUCE BONE BX UNI/BIL CANNULATION INC/IMAGING  11/30/2020   Family History  Problem Relation Age of Onset   Arthritis Mother    Cancer - Colon Mother    Diabetes Father    Arthritis Sister    Colon cancer Sister    Arthritis Sister    Colon cancer Sister    Lung cancer Brother    Social History    Socioeconomic History   Marital status: Widowed    Spouse name: Not on file   Number of children: 2   Years of education: Not on file   Highest education level: Not on file  Occupational History   Occupation: retired  Tobacco Use   Smoking status: Former    Current packs/day: 0.00    Types: Cigarettes    Quit date: 01/24/1993    Years since quitting: 30.5   Smokeless tobacco: Never   Tobacco comments:    quit 1995  Vaping Use   Vaping status: Never Used  Substance and Sexual Activity   Alcohol use: Yes    Alcohol/week: 7.0 standard drinks of alcohol    Types: 7 Glasses of wine per week    Comment: nightly glass of wine  Drug use: No   Sexual activity: Never  Other Topics Concern   Not on file  Social History Narrative   Not on file   Social Drivers of Health   Financial Resource Strain: Low Risk  (07/31/2023)   Overall Financial Resource Strain (CARDIA)    Difficulty of Paying Living Expenses: Not hard at all  Food Insecurity: No Food Insecurity (07/31/2023)   Hunger Vital Sign    Worried About Running Out of Food in the Last Year: Never true    Ran Out of Food in the Last Year: Never true  Transportation Needs: No Transportation Needs (07/31/2023)   PRAPARE - Administrator, Civil Service (Medical): No    Lack of Transportation (Non-Medical): No  Physical Activity: Insufficiently Active (07/31/2023)   Exercise Vital Sign    Days of Exercise per Week: 1 day    Minutes of Exercise per Session: 40 min  Stress: No Stress Concern Present (07/31/2023)   [employer] of Occupational Health - Occupational Stress Questionnaire    Feeling of Stress: Not at all  Social Connections: Socially Isolated (07/31/2023)   Social Connection and Isolation Panel    Frequency of Communication with Friends and Family: More than three times a week    Frequency of Social Gatherings with Friends and Family: More than three times a week    Attends Religious Services: Never     Database administrator or Organizations: No    Attends Banker Meetings: Never    Marital Status: Widowed    Tobacco Counseling Counseling given: Not Answered Tobacco comments: quit 1995    Clinical Intake:  Pre-visit preparation completed: Yes  Pain : No/denies pain     BMI - recorded: 20.02 Nutritional Status: BMI of 19-24  Normal Nutritional Risks: None Diabetes: No  Lab Results  Component Value Date   HGBA1C 5.9 08/03/2021     How often do you need to have someone help you when you read instructions, pamphlets, or other written materials from your doctor or pharmacy?: 1 - Never  Interpreter Needed?: No  Information entered by :: Damaria Stofko [email or web address] ,cma   Activities of Daily Living     07/31/2023   11:38 AM 08/16/2022    1:00 PM  In your present state of health, do you have any difficulty performing the following activities:  Hearing? 0 0  Vision? 0 0  Difficulty concentrating or making decisions? 1 0  Walking or climbing stairs? 1 1  Dressing or bathing? 1 0  Doing errands, shopping? 0 0  Preparing Food and eating ? N   Using the Toilet? N   In the past six months, have you accidently leaked urine? N   Do you have problems with loss of bowel control? N   Managing your Medications? N   Managing your Finances? Y   Housekeeping or managing your Housekeeping? Y     Patient Care Team: Avelina Greig BRAVO, MD as PCP - General (Family Medicine) Myrna Adine Anes, MD as Consulting Physician (Ophthalmology)  I have updated your Care Teams any recent Medical Services you may have received from other providers in the past year.     Assessment:   This is a routine wellness examination for [facility].  Hearing/Vision screen Hearing Screening - Comments:: Patient has no difficulties  Vision Screening - Comments:: Patient wears glasses    Goals Addressed               This Visit's  Progress     Stay Healthy (pt-stated)   On track      Depression  Screen     07/31/2023   11:39 AM 04/11/2023    9:52 AM 12/28/2022   12:08 PM 07/25/2022    3:07 PM 06/29/2022    9:32 AM 04/19/2022   11:28 AM 08/03/2021    5:21 PM  PHQ 2/9 Scores  PHQ - 2 Score 2 2 6  0 0 4 3  PHQ- 9 Score 3 7 17  0 0 10 12    Fall Risk     07/31/2023   11:35 AM 12/28/2022   12:07 PM 07/25/2022    3:13 PM 06/29/2022    9:31 AM 04/19/2022   11:28 AM  Fall Risk   Falls in the past year? 1 1 1  0 1  Number falls in past yr: 1 1 0 0 1  Injury with Fall? 1 1 0 0 0  Risk for fall due to : History of fall(s);Impaired balance/gait;Orthopedic patient History of fall(s) No Fall Risks No Fall Risks History of fall(s);Impaired mobility  Follow up Falls evaluation completed;Education provided  Falls prevention discussed Falls evaluation completed Falls evaluation completed    MEDICARE RISK AT HOME:  Medicare Risk at Home Any stairs in or around the home?: Yes If so, are there any without handrails?: No Home free of loose throw rugs in walkways, pet beds, electrical cords, etc?: Yes Adequate lighting in your home to reduce risk of falls?: Yes Life alert?: No Use of a cane, walker or w/c?: Yes (walker) Grab bars in the bathroom?: Yes Shower chair or bench in shower?: Yes Elevated toilet seat or a handicapped toilet?: Yes  TIMED UP AND GO:  Was the test performed?  No  Cognitive Function: 6CIT completed    04/01/2020    9:10 AM 12/18/2018   10:58 AM 12/13/2017    1:57 PM 11/22/2016   11:10 AM  MMSE - Mini Mental State Exam  Orientation to time 5 5 5 5    Orientation to Place 5 5 5 5    Registration 3 3 3 3    Attention/ Calculation 5 5 0 0   Recall 3 3 3 3    Language- name 2 objects   0 0   Language- repeat 1 1 1 1   Language- follow 3 step command   3 2   Language- follow 3 step command-comments    unable to follow 1 step of 3 step command   Language- read & follow direction   0 0   Write a sentence   0 0   Copy design   0 0   Total score   20 19      Data saved with a  previous flowsheet row definition        07/31/2023   11:35 AM 07/25/2022    3:14 PM 06/22/2021   11:08 AM  6CIT Screen  What Year? 0 points 0 points 0 points  What month? 0 points 0 points 0 points  What time? 0 points 0 points 0 points  Count back from 20 0 points 0 points 0 points  Months in reverse 0 points 0 points 0 points  Repeat phrase 0 points 0 points 0 points  Total Score 0 points 0 points 0 points    Immunizations Immunization History  Administered Date(s) Administered   Fluad Quad(high Dose 65+) 10/23/2018, 10/25/2019   Influenza,inj,quad, With Preservative 11/10/2016   Influenza-Unspecified 11/17/2016, 10/24/2017, 11/11/2022   Moderna Covid-19  Vaccine Bivalent Booster 22yrs & up 08/05/2020, 12/03/2021   Moderna Sars-Covid-2 Vaccination 02/08/2019, 05/14/2019, 11/13/2019, 12/06/2019, 10/15/2020   PFIZER(Purple Top)SARS-COV-2 Vaccination 08/05/2020   PNEUMOCOCCAL CONJUGATE-20 08/31/2022   Pneumococcal Conjugate-13 10/25/2011, 11/22/2016   Pneumococcal Polysaccharide-23 12/13/2017   Unspecified SARS-COV-2 Vaccination 10/21/2022    Screening Tests Health Maintenance  Topic Date Due   DTaP/Tdap/Td (1 - Tdap) Never done   Zoster Vaccines- Shingrix (1 of 2) Never done   COVID-19 Vaccine (9 - Moderna risk 2024-25 season) 04/20/2023   INFLUENZA VACCINE  08/25/2023   Medicare Annual Wellness (AWV)  07/30/2024   Pneumococcal Vaccine: 50+ Years  Completed   DEXA SCAN  Completed   Hepatitis B Vaccines  Aged Out   HPV VACCINES  Aged Out   Meningococcal B Vaccine  Aged Out    Health Maintenance  Health Maintenance Due  Topic Date Due   DTaP/Tdap/Td (1 - Tdap) Never done   Zoster Vaccines- Shingrix (1 of 2) Never done   COVID-19 Vaccine (9 - Moderna risk 2024-25 season) 04/20/2023   Health Maintenance Items Addressed:   Additional Screening:  Vision Screening: Recommended annual ophthalmology exams for early detection of glaucoma and other disorders of the  eye. Would you like a referral to an eye doctor? No    Dental Screening: Recommended annual dental exams for proper oral hygiene  Community Resource Referral / Chronic Care Management: CRR required this visit?  No   CCM required this visit?  Appt scheduled with PCP   Plan:    I have personally reviewed and noted the following in the patient's chart:   Medical and social history Use of alcohol, tobacco or illicit drugs  Current medications and supplements including opioid prescriptions. Patient is not currently taking opioid prescriptions. Functional ability and status Nutritional status Physical activity Advanced directives List of other physicians Hospitalizations, surgeries, and ER visits in previous 12 months Vitals Screenings to include cognitive, depression, and falls Referrals and appointments  In addition, I have reviewed and discussed with patient certain preventive protocols, quality metrics, and best practice recommendations. A written personalized care plan for preventive services as well as general preventive health recommendations were provided to patient.   Lyle MARLA Right, [place]   07/31/2023   After Visit Summary: (MyChart) Due to this being a telephonic visit, the after visit summary with patients personalized plan was offered to patient via MyChart   Notes: Nothing significant to report at this time.

## 2023-07-31 NOTE — Telephone Encounter (Signed)
 Copied from CRM [id number]. Topic: Appointments - Scheduling Inquiry for Clinic >> Jul 31, 2023 11:09 AM [place] A wrote: Reason for RMF:Ejupzwu was calling because she has not received AWV telephone call yet

## 2023-07-31 NOTE — Patient Instructions (Signed)
 Ms. Soltys , Thank you for taking time out of your busy schedule to complete your Annual Wellness Visit with me. I enjoyed our conversation and look forward to speaking with you again next year. I, as well as your care team,  appreciate your ongoing commitment to your health goals. Please review the following plan we discussed and let me know if I can assist you in the future. Your Game plan/ To Do List    Referrals: If you haven't heard from the office you've been referred to, please reach out to them at the phone provided.  none Follow up Visits: Next Medicare AWV with our clinical staff: 08/01/2024   Have you seen your provider in the last 6 months (3 months if uncontrolled diabetes)? No Next Office Visit with your provider:   Clinician Recommendations:  Aim for 30 minutes of exercise or brisk walking, 6-8 glasses of water, and 5 servings of fruits and vegetables each day.       This is a list of the screening recommended for you and due dates:  Health Maintenance  Topic Date Due   DTaP/Tdap/Td vaccine (1 - Tdap) Never done   Zoster (Shingles) Vaccine (1 of 2) Never done   COVID-19 Vaccine (9 - Moderna risk 2024-25 season) 04/20/2023   Flu Shot  08/25/2023   Medicare Annual Wellness Visit  07/30/2024   Pneumococcal Vaccine for age over 41  Completed   DEXA scan (bone density measurement)  Completed   Hepatitis B Vaccine  Aged Out   HPV Vaccine  Aged Out   Meningitis B Vaccine  Aged Out    Advanced directives: (Declined) Advance directive discussed with you today. Even though you declined this today, please call our office should you change your mind, and we can give you the proper paperwork for you to fill out. Advance Care Planning is important because it:  [x]  Makes sure you receive the medical care that is consistent with your values, goals, and preferences  [x]  It provides guidance to your family and loved ones and reduces their decisional burden about whether or not they  are making the right decisions based on your wishes.  Follow the link provided in your after visit summary or read over the paperwork we have mailed to you to help you started getting your Advance Directives in place. If you need assistance in completing these, please reach out to us  so that we can help you!  See attachments for Preventive Care and Fall Prevention Tips.

## 2023-08-17 ENCOUNTER — Telehealth: Payer: Self-pay | Admitting: Family Medicine

## 2023-08-17 NOTE — Telephone Encounter (Signed)
 Dropped off [place] DNR form and needs original back Yellow Form  signed by provider and returned to her.  States she needs the form as soon as possible and can pick up in the morning she heads to work at 8:30 in the morning.  Placed in providers box at front desk for completion.

## 2023-08-17 NOTE — Telephone Encounter (Signed)
 Dr. Avelina is out of the office until Tuesday 08/22/2023 so this will not be signed until then

## 2023-08-18 ENCOUNTER — Telehealth: Payer: Self-pay | Admitting: *Deleted

## 2023-08-18 NOTE — Telephone Encounter (Signed)
 Copied from CRM #[id number]. Topic: General - Other >> Aug 18, 2023 10:47 AM Burnard DEL wrote: Reason for CRM: Patient would like to have an order sent out for her to see a specialist to get injection in her right knee. She was currently seeing Norleen Posey ,however he isn't practicing anymore. Stated that her knee has been giving her some pain.

## 2023-08-22 NOTE — Telephone Encounter (Signed)
 DNR form mailed to [facility] as requested.

## 2023-08-23 NOTE — Telephone Encounter (Signed)
 Spoke with Ms. Andra and scheduled her to see Dr. Watt 08/31/2023 at 10:40 am for knee pain.

## 2023-08-29 ENCOUNTER — Ambulatory Visit: Admitting: Family Medicine

## 2023-08-31 ENCOUNTER — Ambulatory Visit: Admitting: Family Medicine

## 2023-08-31 ENCOUNTER — Encounter: Payer: Self-pay | Admitting: Family Medicine

## 2023-08-31 VITALS — BP 172/62 | HR 57 | Temp 98.7°F | Ht 64.0 in | Wt 115.0 lb

## 2023-08-31 DIAGNOSIS — M1711 Unilateral primary osteoarthritis, right knee: Secondary | ICD-10-CM

## 2023-08-31 MED ORDER — TRIAMCINOLONE ACETONIDE 40 MG/ML IJ SUSP
40.0000 mg | Freq: Once | INTRAMUSCULAR | Status: AC
  Administered 2023-08-31: 40 mg via INTRA_ARTICULAR

## 2023-08-31 NOTE — Progress Notes (Signed)
 [name], MD, CAQ Sports Medicine [facility] [address] [place], [address]  Phone: [phone]  FAX: [phone]  Catherine Young - 89 y.o. female  MRN [id number]  Date of Birth: 1931/03/11  Date: 08/31/2023  PCP: Avelina Greig BRAVO, MD  Referral: Avelina Greig BRAVO, MD  Chief Complaint  Patient presents with   Knee Pain    Right   Subjective:   Catherine Young is a 89 y.o. very pleasant female patient with Body mass index is 19.74 kg/m. who presents with the following:  R knee OA: She does have known significant osteoarthritis of the right knee.  She has had knee pain off and on for several years.  Radiographs from July 2024 are reviewed, and she does have moderate degenerative joint disease.  She also has a history of prior pelvic fracture roughly 1 year ago.  She had a comminuted and displaced fracture of the right sided pubic rami and pubic symphysis.  She has not kind any kind of specific injury  She does have a knee effusion  Review of Systems is noted in the HPI, as appropriate  Objective:   BP (!) 172/62   Pulse (!) 57   Temp 98.7 F (37.1 C) (Temporal)   Ht 5' 4 (1.626 m)   Wt 115 lb (52.2 kg)   SpO2 94%   BMI 19.74 kg/m   GEN: No acute distress; alert,appropriate. PULM: Breathing comfortably in no respiratory distress PSYCH: Normally interactive.   Right knee: Large ballotable effusion She lacks 3 degrees of extension and flexion to 120 Stable to varus and valgus stress Mild medial lateral joint line tenderness ACL, PCL, MCL, and LCL are all stable She does have pain with deep flexion and extension  Laboratory and Imaging Data:  Assessment and Plan:     ICD-10-CM   1. Primary osteoarthritis of right knee  M17.11 triamcinolone  acetonide (KENALOG -40) injection 40 mg     Acute on chronic right-sided knee osteoarthritis with exacerbation.  She has a large effusion today, I think she  will feel quite a bit better if we aspirate her knee and inject it today.  Aspiration/Injection Procedure Note Catherine Young 07/30/1931 Date of procedure: 08/31/2023  Procedure: Large Joint Aspiration / Injection with synovial fluid aspiration of knee Indications: Pain  Procedure Details Patient verbally consented; risks, benefits, and alternatives explained. Patient prepped with Chloraprep. Ethyl chloride for anesthesia. 10 cc of 1% Lidocaine  used in wheal then injected Subcutaneous fashion with 22 gauge needle on lateral approach. Under sterilne conditions, 18 gauge needle used via lateral approach to aspirate 65 cc of yellowish synovial fluid. Then 9 cc of Lidocaine  1% and 1 mL of Kenalog  40 mg injected. Tolerated well, decreased pain, no complications. Medication: 1 mL of Kenalog  40 mg   Medication Management during today's office visit: Meds ordered this encounter  Medications   triamcinolone  acetonide (KENALOG -40) injection 40 mg    Dr[employer] One speech-to-text software was used for transcription in this dictation.  Possible transcriptional errors can occur using Animal nutritionist.   Signed,  Jacques DASEN. Davi Rotan, MD   Outpatient Encounter Medications as of 08/31/2023  Medication Sig   acetaminophen  (TYLENOL ) 500 MG tablet Take 1,000 mg by mouth 3 (three) times daily as needed. Give Two tablets by mouth twice daily.   amLODipine  (NORVASC ) 5 MG tablet Take 1 tablet (5 mg total) by mouth daily.   bismuth subsalicylate (PEPTO BISMOL) 262  MG/15ML suspension Take 30 mLs by mouth as needed.   celecoxib  (CELEBREX ) 200 MG capsule Take 1 capsule (200 mg total) by mouth 2 (two) times daily.   cholestyramine  light 4 g POWD Take 1 packet (4 g total) by mouth 3 (three) times daily as needed.   clonazePAM  (KLONOPIN ) 0.5 MG tablet Take 0.5 tablets (0.25 mg total) by mouth 2 (two) times daily as needed for anxiety.   donepezil  (ARICEPT ) 10 MG tablet Take 1 tablet (10 mg total) by mouth at  bedtime.   gabapentin  (NEURONTIN ) 100 MG capsule Take 1 capsule (100 mg total) by mouth 3 (three) times daily as needed.   mirtazapine  (REMERON ) 15 MG tablet Take 1 tablet (15 mg total) by mouth at bedtime.   sertraline  (ZOLOFT ) 100 MG tablet TAKE 1 TABLET(100 MG) BY MOUTH AT BEDTIME   traZODone  (DESYREL ) 100 MG tablet TAKE 1 TABLET(50 MG) BY MOUTH AT BEDTIME AS NEEDED FOR SLEEP   Vitamin D , Ergocalciferol , (DRISDOL ) 1.25 MG (50000 UNIT) CAPS capsule Take 1 capsule (50,000 Units total) by mouth every 7 (seven) days.   Wheat Dextrin (BENEFIBER PO) Take 1 capsule by mouth daily.   [EXPIRED] triamcinolone  acetonide (KENALOG -40) injection 40 mg    No facility-administered encounter medications on file as of 08/31/2023.

## 2023-10-30 ENCOUNTER — Telehealth: Payer: Self-pay | Admitting: *Deleted

## 2023-10-30 NOTE — Telephone Encounter (Signed)
-----   Message from [place] Cop[place] sent at 10/29/2023  3:43 PM EDT ----- Regarding: appt: injection too early This patient has an appointment with me on Wednesday.  If she wants to have a corticosteroid injection of the knee, it is too early.  I do not believe that we tried to approve viscosupplementation for her.

## 2023-10-30 NOTE — Telephone Encounter (Signed)
 Left message for Catherine Young that it is too early to get another injection in her right knee. Last injection 08/31/2023 and they can only be done every three months.  I ask that she call back to rescheduled appointment after 12/01/23.

## 2023-11-01 ENCOUNTER — Ambulatory Visit: Admitting: Family Medicine

## 2023-11-07 ENCOUNTER — Telehealth: Payer: Self-pay

## 2023-11-07 NOTE — Telephone Encounter (Signed)
 Copied from CRM [id number]. Topic: Clinical - Medication Question >> Nov 07, 2023 10:51 AM Terri MATSU wrote: Reason for CRM: Debbie from [facility] where patient lives at calling regarding she sent a fax over for the order of the flu/covid vaccinations on Oct 9th for patient and she still hasn't heard back. Callback back number [phone]

## 2023-11-08 ENCOUNTER — Telehealth: Payer: Self-pay

## 2023-11-08 NOTE — Telephone Encounter (Signed)
 Copied from CRM #[id number]. Topic: Clinical - Medication Question >> Nov 08, 2023 10:54 AM Berneda FALCON wrote: Reason for CRM: Debbie from [facility] is calling back today as she has not heard back about the requested orders for flu and Covid vaccine orders requested for the patient. She states she sent over the orders on 10/9 and has not heard back yet. Looks as if she also called yesterday about an update.  Is this something we can assist her with?  Debbie's callback is [phone]

## 2023-11-08 NOTE — Telephone Encounter (Signed)
 Placed form on Dr. Sherrel laptop to sign.

## 2023-11-08 NOTE — Telephone Encounter (Signed)
 Signed order faxed back to Debbie at [phone].

## 2023-12-03 NOTE — Progress Notes (Unsigned)
     [name], MD, CAQ Sports Medicine [facility] [address] [place], [address]  Phone: [phone]  FAX: [phone]  Catherine Young - 89 y.o. female  MRN [id number]  Date of Birth: [birth year]  Date: 12/04/2023  PCP: Avelina Greig BRAVO, MD  Referral: Avelina Greig BRAVO, MD  No chief complaint on file.   Patient is here for follow-up knee arthritis.  Last month saw her, did do an intra-articular knee injection on the right side for arthritis.    ICD-10-CM   1. Primary osteoarthritis of right knee  M17.11      Aspiration/Injection Procedure Note JOUA BAKE [birth year] Date of procedure: 12/04/2023  Procedure: Large Joint Aspiration / Injection of Knee, R Indications: Pain  Procedure Details Patient verbally consented to procedure. Risks, benefits, and alternatives explained. Sterilely prepped with Chloraprep. Ethyl cholride used for anesthesia. 9 cc Lidocaine  1% mixed with 1 mL of Kenalog  40 mg injected using the anteromedial approach without difficulty. No complications with procedure and tolerated well. Patient had decreased pain post-injection. Medication: 1 mL of Kenalog  40 mg   Medication Management during today's office visit: No orders of the defined types were placed in this encounter.  There are no discontinued medications.  Orders placed today for conditions managed today: No orders of the defined types were placed in this encounter.   Disposition: No follow-ups on file.  Dragon [employer] One speech-to-text software was used for transcription in this dictation.  Possible transcriptional errors can occur using Animal nutritionist.   Signed,  Jacques DASEN. Square Jowett, MD   Outpatient Encounter Medications as of 12/04/2023  Medication Sig   acetaminophen  (TYLENOL ) 500 MG tablet Take 1,000 mg by mouth 3 (three) times daily as needed. Give Two tablets by mouth twice daily.   amLODipine  (NORVASC ) 5 MG tablet Take 1  tablet (5 mg total) by mouth daily.   bismuth subsalicylate (PEPTO BISMOL) 262 MG/15ML suspension Take 30 mLs by mouth as needed.   celecoxib  (CELEBREX ) 200 MG capsule Take 1 capsule (200 mg total) by mouth 2 (two) times daily.   cholestyramine  light 4 g POWD Take 1 packet (4 g total) by mouth 3 (three) times daily as needed.   clonazePAM  (KLONOPIN ) 0.5 MG tablet Take 0.5 tablets (0.25 mg total) by mouth 2 (two) times daily as needed for anxiety.   donepezil  (ARICEPT ) 10 MG tablet Take 1 tablet (10 mg total) by mouth at bedtime.   gabapentin  (NEURONTIN ) 100 MG capsule Take 1 capsule (100 mg total) by mouth 3 (three) times daily as needed.   mirtazapine  (REMERON ) 15 MG tablet Take 1 tablet (15 mg total) by mouth at bedtime.   sertraline  (ZOLOFT ) 100 MG tablet TAKE 1 TABLET(100 MG) BY MOUTH AT BEDTIME   traZODone  (DESYREL ) 100 MG tablet TAKE 1 TABLET(50 MG) BY MOUTH AT BEDTIME AS NEEDED FOR SLEEP   Vitamin D , Ergocalciferol , (DRISDOL ) 1.25 MG (50000 UNIT) CAPS capsule Take 1 capsule (50,000 Units total) by mouth every 7 (seven) days.   Wheat Dextrin (BENEFIBER PO) Take 1 capsule by mouth daily.   No facility-administered encounter medications on file as of 12/04/2023.

## 2023-12-04 ENCOUNTER — Encounter: Payer: Self-pay | Admitting: Family Medicine

## 2023-12-04 ENCOUNTER — Ambulatory Visit: Admitting: Family Medicine

## 2023-12-04 VITALS — BP 170/72 | HR 52 | Temp 98.4°F | Ht 64.0 in | Wt 121.1 lb

## 2023-12-04 DIAGNOSIS — G609 Hereditary and idiopathic neuropathy, unspecified: Secondary | ICD-10-CM | POA: Diagnosis not present

## 2023-12-04 DIAGNOSIS — M48061 Spinal stenosis, lumbar region without neurogenic claudication: Secondary | ICD-10-CM | POA: Diagnosis not present

## 2023-12-04 DIAGNOSIS — M1711 Unilateral primary osteoarthritis, right knee: Secondary | ICD-10-CM

## 2023-12-04 MED ORDER — TRAZODONE HCL 100 MG PO TABS: 100.0000 mg | ORAL_TABLET | Freq: Every day | ORAL | 3 refills | Status: AC

## 2023-12-04 MED ORDER — TRIAMCINOLONE ACETONIDE 40 MG/ML IJ SUSP
40.0000 mg | Freq: Once | INTRAMUSCULAR | Status: AC
  Administered 2023-12-04: 40 mg via INTRA_ARTICULAR

## 2023-12-04 MED ORDER — PREGABALIN 50 MG PO CAPS: 50.0000 mg | ORAL_CAPSULE | Freq: Three times a day (TID) | ORAL | 3 refills | Status: DC

## 2023-12-04 NOTE — Patient Instructions (Signed)
 Stop the Gabapentin   Change to Lyrica (pregabalin) 1 capsule twice a day

## 2023-12-04 NOTE — Addendum Note (Signed)
 Addended by: WENDELL ARLAND RAMAN on: 12/04/2023 04:11 PM   Modules accepted: Orders

## 2023-12-05 ENCOUNTER — Telehealth: Payer: Self-pay | Admitting: Family Medicine

## 2023-12-05 NOTE — Telephone Encounter (Unsigned)
 Copied from CRM [id number]. Topic: Clinical - Medication Question >> Dec 05, 2023 12:03 PM Eva FALCON wrote: Reason for CRM: Maude from [facility] states Pt had appointment yesterday and was prescribed Lyrica which the pharmacy delivered. States pt mentioned that she believes the provider discontinued Gabapentin . Maude was hoping to have a order faxed over with clarification to [phone]. Maude also provided his direct number [phone] in case of more questions or concerns.

## 2023-12-06 NOTE — Telephone Encounter (Signed)
 Order to discontinue gabapentin  faxed to [place] at [facility] at [phone].

## 2023-12-06 NOTE — Telephone Encounter (Signed)
 Yes, I am placing a written order to d/c Gabapentin  on your desk.

## 2024-01-26 ENCOUNTER — Ambulatory Visit: Payer: Self-pay

## 2024-01-26 ENCOUNTER — Encounter: Payer: Self-pay | Admitting: Family Medicine

## 2024-01-26 ENCOUNTER — Telehealth: Payer: Self-pay

## 2024-01-26 ENCOUNTER — Ambulatory Visit: Admitting: Family Medicine

## 2024-01-26 VITALS — BP 148/62 | HR 50 | Temp 97.7°F | Ht 64.0 in | Wt 120.2 lb

## 2024-01-26 DIAGNOSIS — M25551 Pain in right hip: Secondary | ICD-10-CM

## 2024-01-26 DIAGNOSIS — G8929 Other chronic pain: Secondary | ICD-10-CM

## 2024-01-26 DIAGNOSIS — F411 Generalized anxiety disorder: Secondary | ICD-10-CM | POA: Diagnosis not present

## 2024-01-26 DIAGNOSIS — M25552 Pain in left hip: Secondary | ICD-10-CM

## 2024-01-26 DIAGNOSIS — M25561 Pain in right knee: Secondary | ICD-10-CM

## 2024-01-26 MED ORDER — PREDNISONE 10 MG PO TABS: ORAL_TABLET | ORAL | 0 refills | Status: AC

## 2024-01-26 MED ORDER — MIRTAZAPINE 30 MG PO TABS: 30.0000 mg | ORAL_TABLET | Freq: Every day | ORAL | Status: AC

## 2024-01-26 MED ORDER — SERTRALINE HCL 50 MG PO TABS: ORAL_TABLET | ORAL | Status: AC

## 2024-01-26 MED ORDER — TRAMADOL HCL 50 MG PO TABS: 25.0000 mg | ORAL_TABLET | Freq: Three times a day (TID) | ORAL | 0 refills | Status: DC | PRN

## 2024-01-26 NOTE — Telephone Encounter (Signed)
 FYI Only or Action Required?: FYI only for provider: appointment scheduled on 01/26/24.  Patient was last seen in primary care on 12/04/2023 by Watt Mirza, MD.  Called Nurse Triage reporting Leg Pain.  Symptoms began chronic, worse x 1 week.  Interventions attempted: Prescription medications: pregabalin .  Symptoms are: gradually worsening.  Triage Disposition: See HCP Within 4 Hours (Or PCP Triage)  Patient/caregiver understands and will follow disposition?: Yes  Reason for Disposition  [1] SEVERE pain (e.g., excruciating, unable to do any normal activities) AND [2] not improved after 2 hours of pain medicine  Answer Assessment - Initial Assessment Questions Pt reports hx of neuropathy and fx hip one year ago. Is c/o severe bilateral leg pain. Right knee appears swollen. Hx of osteoarthritis in her right knee, sees Dr. Watt for injections. Confirmed she is taking pregabalin , states does help.  1. ONSET: When did the pain start?      One week 2. LOCATION: Where is the pain located?      Bilateral, right is worse 3. PAIN: How bad is the pain?    (Scale 1-10; or mild, moderate, severe)     10/10 at times 4. WORK OR EXERCISE: Has there been any recent work or exercise that involved this part of the body?      NA 5. CAUSE: What do you think is causing the leg pain?     Chronic pain, but worse than normal  Protocols used: Leg Pain-A-AH Copied from CRM #[id number]. Topic: Clinical - Red Word Triage >> Jan 26, 2024  9:34 AM Dedra B wrote: Red Word that prompted transfer to Nurse Triage: Pt said she's having severe pain in her legs and it's hard for her to walk. Warm transfer to NT. >> Jan 26, 2024  9:45 AM Dedra B wrote: Pt no longer wishes to hold and would like a call back at [phone]. Past Medical History:  Diagnosis Date   Anxiety    Arthritis    Basal cell carcinoma of tragus, left    Cataract    right   Complication of anesthesia    COULD'T MOVE WHEN  WAKING UP AFTER SECONT COLON SURGERY   Depression    Hypertension    Intestinal obstruction (HCC)    Osteoporosis

## 2024-01-26 NOTE — Progress Notes (Signed)
 "   Patient ID: Catherine Young, female    DOB: 06/16/1931, 89 y.o.   MRN: [id number]  This visit was conducted in person.  BP (!) 148/62   Pulse (!) 50   Temp 97.7 F (36.5 C) (Temporal)   Ht 5' 4 (1.626 m)   Wt 120 lb 4 oz (54.5 kg)   SpO2 95%   BMI 20.64 kg/m    CC:  Chief Complaint  Patient presents with   Hip Pain   Knee Pain    Right   Leg Pain    Left    Subjective:   HPI: Catherine Young is a 89 y.o. female presenting on 01/26/2024 for Hip Pain, Knee Pain (Right), and Leg Pain (Left)   History of pubic rami fracture 2024.. inoperable per Dr. Tobie Beers  History of chronic right knee pain: Past x-ray has shown significant osteoarthritis.    Was using Celebrex  200 mg twice daily for pain.. but stopped helping.    Pain in right buttock radiates to right significant in last week.   No recent fall.   Both feet tingly for  years.,,, on lyrica  50 mg TID      Relevant past medical, surgical, family and social history reviewed and updated as indicated. Interim medical history since our last visit reviewed. Allergies and medications reviewed and updated. Outpatient Medications Prior to Visit  Medication Sig Dispense Refill   acetaminophen  (TYLENOL ) 500 MG tablet Take 1,000 mg by mouth 3 (three) times daily as needed. Give Two tablets by mouth twice daily.     amLODipine  (NORVASC ) 5 MG tablet Take 1 tablet (5 mg total) by mouth daily. 30 tablet 0   cholestyramine  (QUESTRAN ) 4 g packet Take 4 g by mouth 3 (three) times daily.     donepezil  (ARICEPT ) 10 MG tablet Take 1 tablet (10 mg total) by mouth at bedtime. 90 tablet 3   pregabalin  (LYRICA ) 50 MG capsule Take 1 capsule (50 mg total) by mouth 3 (three) times daily. 60 capsule 3   traZODone  (DESYREL ) 100 MG tablet Take 1 tablet (100 mg total) by mouth at bedtime. 30 tablet 3   Vitamin D , Ergocalciferol , (DRISDOL ) 1.25 MG (50000 UNIT) CAPS capsule Take 1 capsule (50,000 Units total) by mouth every 7 (seven) days. 12  capsule 0   celecoxib  (CELEBREX ) 200 MG capsule Take 1 capsule (200 mg total) by mouth 2 (two) times daily. 60 capsule 0   clonazePAM  (KLONOPIN ) 0.5 MG tablet Take 0.5 tablets (0.25 mg total) by mouth 2 (two) times daily as needed for anxiety. 30 tablet 0   gabapentin  (NEURONTIN ) 100 MG capsule Take 1 capsule (100 mg total) by mouth 3 (three) times daily as needed. 90 capsule 1   mirtazapine  (REMERON ) 15 MG tablet Take 1 tablet (15 mg total) by mouth at bedtime. 30 tablet 0   sertraline  (ZOLOFT ) 100 MG tablet TAKE 1 TABLET(100 MG) BY MOUTH AT BEDTIME 90 tablet 0   cholestyramine  light 4 g POWD Take 1 packet (4 g total) by mouth 3 (three) times daily as needed. 30 packet 3   No facility-administered medications prior to visit.     Per HPI unless specifically indicated in ROS section below Review of Systems  Constitutional:  Negative for fatigue and fever.  HENT:  Negative for congestion.   Eyes:  Negative for pain.  Respiratory:  Negative for cough and shortness of breath.   Cardiovascular:  Negative for chest pain, palpitations and leg swelling.  Gastrointestinal:  Negative for abdominal pain.  Genitourinary:  Negative for dysuria and vaginal bleeding.  Musculoskeletal:  Positive for arthralgias, back pain and myalgias.  Neurological:  Negative for syncope, light-headedness and headaches.  Psychiatric/Behavioral:  Negative for dysphoric mood.    Objective:  BP (!) 148/62   Pulse (!) 50   Temp 97.7 F (36.5 C) (Temporal)   Ht 5' 4 (1.626 m)   Wt 120 lb 4 oz (54.5 kg)   SpO2 95%   BMI 20.64 kg/m   Wt Readings from Last 3 Encounters:  01/26/24 120 lb 4 oz (54.5 kg)  12/04/23 121 lb 2 oz (54.9 kg)  08/31/23 115 lb (52.2 kg)      Physical Exam Constitutional:      General: She is not in acute distress.    Appearance: Normal appearance. She is well-developed. She is not ill-appearing or toxic-appearing.  HENT:     Head: Normocephalic.     Right Ear: Hearing, tympanic  membrane, ear canal and external ear normal. Tympanic membrane is not erythematous, retracted or bulging.     Left Ear: Hearing, tympanic membrane, ear canal and external ear normal. Tympanic membrane is not erythematous, retracted or bulging.     Nose: No mucosal edema or rhinorrhea.     Right Sinus: No maxillary sinus tenderness or frontal sinus tenderness.     Left Sinus: No maxillary sinus tenderness or frontal sinus tenderness.     Mouth/Throat:     Pharynx: Uvula midline.  Eyes:     General: Lids are normal. Lids are everted, no foreign bodies appreciated.     Conjunctiva/sclera: Conjunctivae normal.     Pupils: Pupils are equal, round, and reactive to light.  Neck:     Thyroid : No thyroid  mass or thyromegaly.     Vascular: No carotid bruit.     Trachea: Trachea normal.  Cardiovascular:     Rate and Rhythm: Normal rate and regular rhythm.     Pulses: Normal pulses.     Heart sounds: Normal heart sounds, S1 normal and S2 normal. No murmur heard.    No friction rub. No gallop.  Pulmonary:     Effort: Pulmonary effort is normal. No tachypnea or respiratory distress.     Breath sounds: Normal breath sounds. No decreased breath sounds, wheezing, rhonchi or rales.  Abdominal:     General: Bowel sounds are normal.     Palpations: Abdomen is soft.     Tenderness: There is no abdominal tenderness.  Musculoskeletal:     Cervical back: Neck supple. No tenderness. Decreased range of motion.     Thoracic back: Decreased range of motion.     Lumbar back: Tenderness present. No bony tenderness. Decreased range of motion. Negative right straight leg raise test and negative left straight leg raise test.     Right hip: No bony tenderness. Decreased range of motion.     Left hip: No bony tenderness. Decreased range of motion.     Right knee: Decreased range of motion. Tenderness present over the medial joint line and lateral joint line.     Left knee: Bony tenderness present. Decreased range of  motion.     Comments: Tenderness to palpation in left buttock Mildly positive straight leg raise to left leg  Skin:    General: Skin is warm and dry.     Findings: No rash.  Neurological:     Mental Status: She is alert.  Psychiatric:        Mood  and Affect: Mood is not anxious or depressed.        Speech: Speech normal.        Behavior: Behavior normal. Behavior is cooperative.        Thought Content: Thought content normal.        Judgment: Judgment normal.       Results for orders placed or performed in visit on 07/11/23  Iron, TIBC and Ferritin Panel   Collection Time: 07/11/23  4:05 PM  Result Value Ref Range   Iron 144 45 - 160 mcg/dL   TIBC 677 749 - 549 mcg/dL (calc)   %SAT 45 16 - 45 % (calc)   Ferritin 23 16 - 288 ng/mL  Basic metabolic panel with GFR   Collection Time: 07/11/23  4:05 PM  Result Value Ref Range   Sodium 139 135 - 145 mEq/L   Potassium 5.4 No hemolysis seen (H) 3.5 - 5.1 mEq/L   Chloride 103 96 - 112 mEq/L   CO2 30 19 - 32 mEq/L   Glucose, Bld 89 70 - 99 mg/dL   BUN 30 (H) 6 - 23 mg/dL   Creatinine, Ser 8.71 (H) 0.40 - 1.20 mg/dL   GFR 62.91 (L) >39.99 mL/min   Calcium 9.7 8.4 - 10.5 mg/dL  A87 and Folate Panel   Collection Time: 07/11/23  4:05 PM  Result Value Ref Range   Vitamin B-12 232 211 - 911 pg/mL   Folate 12.4 >5.9 ng/mL  CBC with Differential/Platelet   Collection Time: 07/11/23  4:05 PM  Result Value Ref Range   WBC 6.4 4.0 - 10.5 K/uL   RBC 3.44 (L) 3.87 - 5.11 Mil/uL   Hemoglobin 11.0 (L) 12.0 - 15.0 g/dL   HCT 66.2 (L) 63.9 - 53.9 %   MCV 98.0 78.0 - 100.0 fl   MCHC 32.5 30.0 - 36.0 g/dL   RDW 86.1 88.4 - 84.4 %   Platelets 228.0 150.0 - 400.0 K/uL   Neutrophils Relative % 60.4 43.0 - 77.0 %   Lymphocytes Relative 25.7 12.0 - 46.0 %   Monocytes Relative 9.4 3.0 - 12.0 %   Eosinophils Relative 4.1 0.0 - 5.0 %   Basophils Relative 0.4 0.0 - 3.0 %   Neutro Abs 3.8 1.4 - 7.7 K/uL   Lymphs Abs 1.6 0.7 - 4.0 K/uL    Monocytes Absolute 0.6 0.1 - 1.0 K/uL   Eosinophils Absolute 0.3 0.0 - 0.7 K/uL   Basophils Absolute 0.0 0.0 - 0.1 K/uL  Celiac Disease Ab Screen w/Rfx   Collection Time: 07/11/23  4:05 PM  Result Value Ref Range   Deamidated Gliadin Abs, IgA 1 0 - 19 units   t-Transglutaminase (tTG) IgA <2 0 - 3 U/mL   Immunoglobulin A, (IgA) QN, Serum 80 64 - 422 mg/dL    Assessment and Plan  Chronic pain of right knee  Bilateral hip pain  GAD (generalized anxiety disorder) -     Sertraline  HCl; TAKE 1 TABLET(100 MG) BY MOUTH AT BEDTIME  Other orders -     Mirtazapine ; Take 1 tablet (30 mg total) by mouth at bedtime. -     predniSONE ; 3 tabs by mouth daily  in AM x 3 days, then 2 tabs by mouth daily in AM x 2 days then 1 tab by mouth daily in AM x 2 days  Dispense: 15 tablet; Refill: 0   Patient with significant body pain throughout likely secondary to osteoarthritis.  She does have some evidence of  new left buttock pain that could be secondary to acute sciatica flare. Will complete prednisone  taper, work on home physical therapy. Start tramadol  25 to 50 mg p.o. every 12 hours as needed pain.  Continued poor control of anxiety and depression despite mirtazapine  30 mg at bedtime and sertraline  100 mg p.o. nightly. No follow-ups on file.   Greig Ring, MD  "

## 2024-01-26 NOTE — Telephone Encounter (Signed)
 Copied from CRM [id number]. Topic: Clinical - Medication Question >> Jan 26, 2024  4:47 PM Drema MATSU wrote: Reason for CRM: Lorn with [facility] stated that they have that pt is allergic to predniSONE  (DELTASONE ) 10 MG tablet  and is needing the ok if it can be filled or not. Patient directions state that she need to start it tomorrow

## 2024-01-29 NOTE — Telephone Encounter (Signed)
 Savannah at [facility] notified as instructed by telephone.

## 2024-02-05 ENCOUNTER — Telehealth: Payer: Self-pay | Admitting: Family Medicine

## 2024-02-05 NOTE — Telephone Encounter (Signed)
 Spoke with Ms. Andra.  She states she doesn't think she was given the prednisone  correctly at [facility].    She states she thinks that they only gave it to her one day instead of the taper as prescribed and now her pain is back.  She doesn't know what to do about this.  She did give me a name Maude Gaskins who oversees her care at [facility] so I have left a message for him to return my call to discuss this.

## 2024-02-05 NOTE — Telephone Encounter (Unsigned)
 Copied from CRM #[id number]. Topic: Clinical - Medication Question >> Feb 05, 2024  2:41 PM China J wrote: Reason for CRM: Patient has her medications mixed up and she is needing clarification on her prednisone .   Please call patient at: [phone]

## 2024-02-06 ENCOUNTER — Telehealth: Payer: Self-pay | Admitting: Family Medicine

## 2024-02-06 NOTE — Telephone Encounter (Signed)
 Copied from CRM #[id number]. Topic: General - Other >> Feb 05, 2024  5:25 PM Alfonso HERO wrote: Reason for CRM: Maude from [facility] called back. He stated the patient rcvd all 7 days of her prednisone .

## 2024-02-06 NOTE — Telephone Encounter (Signed)
 Next Appt With Family Medicine Michail Schroeder, MD) 03/06/2024 at 2:20 PM

## 2024-02-06 NOTE — Telephone Encounter (Signed)
 Left message for Catherine Young to return call to office.

## 2024-02-06 NOTE — Telephone Encounter (Signed)
 Copied from CRM [id number]. Topic: Appointments - Appointment Scheduling >> Feb 06, 2024  3:36 PM Catherine Young wrote: Patient/patient representative is calling to schedule an appointment. Refer to attachments for appointment information.  Ms. Muldrew needs to know when her next appointment is with Dr. Ubaldo  to get a shot and drain fluid off her right knee. Please call Ms. Tieszen at [phone]  Is it okay to get to scheduled?

## 2024-02-06 NOTE — Telephone Encounter (Signed)
 Please advise next steps

## 2024-02-06 NOTE — Telephone Encounter (Signed)
 Spoke with [name] and let her know that Catherine Young said they gave her all the prednisone .  She said the pain is still the right buttocks area.  Some days pain is better than others.  She states they finally gave her the Tramadol   last night and it did help.  She is asking if the Tramadol  can be prescribed at scheduled times instead of as needed.  She states being an as needed medication takes a lot to get it from the nursing staff to take.  Please advise.

## 2024-02-06 NOTE — Telephone Encounter (Signed)
 Copied from CRM #[id number]. Topic: Clinical - Medication Question >> Feb 05, 2024  2:41 PM China J wrote: Reason for CRM: Patient has her medications mixed up and she is needing clarification on her prednisone .   Please call patient at: [phone] >> Feb 06, 2024  3:35 PM Montie POUR wrote: Ms. Garretson is calling Arland Morel, CMA back. I tried to transfer to clinic and Ms. Morel was just taking a patient back. Please call Ms. Ohlsen back at [phone]. Thanks

## 2024-02-06 NOTE — Telephone Encounter (Signed)
 I could not drain her knee again until 03/05/2024.

## 2024-02-07 NOTE — Telephone Encounter (Signed)
 Please write order on prescription pad so I can fax over to [facility].

## 2024-02-07 NOTE — Telephone Encounter (Signed)
 Spoke with [name] and let her know that Catherine Young said they gave her all the prednisone .  She said the pain is still the right buttocks area.  Some days pain is better than others.  She states they finally gave her the Tramadol   last night and it did help.  She is asking if the Tramadol  can be prescribed at scheduled times instead of as needed.  She states being an as needed medication takes a lot to get it from the nursing staff to take.  Please advise.

## 2024-02-07 NOTE — Telephone Encounter (Signed)
 Updated order on Tramadol  faxed to [facility] at [phone].  Left message for Ms Krock letting her know this has been done.

## 2024-02-08 ENCOUNTER — Other Ambulatory Visit: Payer: Self-pay | Admitting: *Deleted

## 2024-02-08 MED ORDER — TRAMADOL HCL 50 MG PO TABS: 50.0000 mg | ORAL_TABLET | Freq: Two times a day (BID) | ORAL | 0 refills | Status: DC

## 2024-02-08 MED ORDER — TRAMADOL HCL 50 MG PO TABS: 50.0000 mg | ORAL_TABLET | Freq: Two times a day (BID) | ORAL | 0 refills | Status: AC

## 2024-02-08 NOTE — Telephone Encounter (Signed)
 Copied from CRM #[id number]. Topic: Clinical - Prescription Issue >> Feb 08, 2024 11:24 AM Catherine Young wrote: Reason for CRM: traMADol  (ULTRAM ) 50 MG tablet Catherine Young CALL IN STATED THEY WILL NEED VERBAL OR NEW PRESCRIPTION DIDN'T HAVE QUANTITY   [phone]

## 2024-02-08 NOTE — Addendum Note (Signed)
 Addended by: WENDELL ARLAND RAMAN on: 02/08/2024 12:44 PM   Modules accepted: Orders

## 2024-02-08 NOTE — Telephone Encounter (Signed)
 Sent to wrong pharmacy.  Please resend to [facility].

## 2024-02-21 ENCOUNTER — Other Ambulatory Visit: Payer: Self-pay | Admitting: Family Medicine

## 2024-02-21 NOTE — Telephone Encounter (Signed)
 Last office visit 01/26/24 for hip/knee pain.  Last refilled 12/04/2023 for #60 with 3 refills.  Next Appt: 03/06/24 with Dr. Watt for knee pain. Instructions state three times a day.  Do we want to increase quantity to #90?

## 2024-02-28 ENCOUNTER — Other Ambulatory Visit: Payer: Self-pay | Admitting: Family Medicine

## 2024-03-06 ENCOUNTER — Ambulatory Visit: Admitting: Family Medicine

## 2024-08-01 ENCOUNTER — Ambulatory Visit
